# Patient Record
Sex: Female | Born: 1945 | Race: White | Hispanic: No | Smoking: Former smoker
Health system: Southern US, Community
[De-identification: ages and names within clinical notes are randomized; demographics above are authoritative.]

## PROBLEM LIST (undated history)

## (undated) DIAGNOSIS — R112 Nausea with vomiting, unspecified: Secondary | ICD-10-CM

## (undated) DIAGNOSIS — F32A Depression, unspecified: Secondary | ICD-10-CM

## (undated) DIAGNOSIS — Z8042 Family history of malignant neoplasm of prostate: Secondary | ICD-10-CM

## (undated) DIAGNOSIS — F0390 Unspecified dementia without behavioral disturbance: Secondary | ICD-10-CM

## (undated) DIAGNOSIS — E039 Hypothyroidism, unspecified: Secondary | ICD-10-CM

## (undated) DIAGNOSIS — K219 Gastro-esophageal reflux disease without esophagitis: Secondary | ICD-10-CM

## (undated) DIAGNOSIS — M1990 Unspecified osteoarthritis, unspecified site: Secondary | ICD-10-CM

## (undated) DIAGNOSIS — D219 Benign neoplasm of connective and other soft tissue, unspecified: Secondary | ICD-10-CM

## (undated) DIAGNOSIS — F329 Major depressive disorder, single episode, unspecified: Secondary | ICD-10-CM

## (undated) DIAGNOSIS — Z800 Family history of malignant neoplasm of digestive organs: Secondary | ICD-10-CM

## (undated) DIAGNOSIS — F419 Anxiety disorder, unspecified: Secondary | ICD-10-CM

## (undated) DIAGNOSIS — Z9889 Other specified postprocedural states: Secondary | ICD-10-CM

## (undated) DIAGNOSIS — R51 Headache: Secondary | ICD-10-CM

## (undated) DIAGNOSIS — Z8489 Family history of other specified conditions: Secondary | ICD-10-CM

## (undated) DIAGNOSIS — Z803 Family history of malignant neoplasm of breast: Secondary | ICD-10-CM

## (undated) DIAGNOSIS — G4730 Sleep apnea, unspecified: Secondary | ICD-10-CM

## (undated) DIAGNOSIS — R519 Headache, unspecified: Secondary | ICD-10-CM

## (undated) HISTORY — PX: CHOLECYSTECTOMY: SHX55

## (undated) HISTORY — PX: DILATION AND CURETTAGE OF UTERUS: SHX78

## (undated) HISTORY — PX: BREAST SURGERY: SHX581

## (undated) HISTORY — PX: ABDOMINAL HYSTERECTOMY: SHX81

## (undated) HISTORY — PX: SHOULDER ARTHROSCOPY: SHX128

## (undated) HISTORY — DX: Family history of malignant neoplasm of digestive organs: Z80.0

## (undated) HISTORY — DX: Family history of malignant neoplasm of prostate: Z80.42

## (undated) HISTORY — PX: VEIN LIGATION AND STRIPPING: SHX2653

## (undated) HISTORY — DX: Family history of malignant neoplasm of breast: Z80.3

---

## 1988-10-19 HISTORY — PX: AUGMENTATION MAMMAPLASTY: SUR837

## 1988-10-19 HISTORY — PX: REDUCTION MAMMAPLASTY: SUR839

## 2002-02-01 ENCOUNTER — Ambulatory Visit (HOSPITAL_COMMUNITY): Admission: RE | Admit: 2002-02-01 | Discharge: 2002-02-01 | Payer: Self-pay | Admitting: Plastic Surgery

## 2006-12-01 ENCOUNTER — Ambulatory Visit: Payer: Self-pay | Admitting: Gynecologic Oncology

## 2007-06-28 ENCOUNTER — Ambulatory Visit: Payer: Self-pay | Admitting: Gynecologic Oncology

## 2007-07-20 ENCOUNTER — Ambulatory Visit: Payer: Self-pay | Admitting: Gynecologic Oncology

## 2010-02-13 ENCOUNTER — Ambulatory Visit: Payer: Self-pay | Admitting: Gastroenterology

## 2010-02-20 ENCOUNTER — Ambulatory Visit: Payer: Self-pay | Admitting: Gastroenterology

## 2010-02-28 ENCOUNTER — Ambulatory Visit: Payer: Self-pay | Admitting: Surgery

## 2010-03-03 ENCOUNTER — Ambulatory Visit: Payer: Self-pay | Admitting: Surgery

## 2010-09-29 ENCOUNTER — Ambulatory Visit: Payer: Self-pay | Admitting: Family Medicine

## 2010-10-30 ENCOUNTER — Ambulatory Visit: Payer: Self-pay | Admitting: Unknown Physician Specialty

## 2011-04-08 ENCOUNTER — Ambulatory Visit: Payer: Self-pay | Admitting: General Practice

## 2011-04-10 ENCOUNTER — Ambulatory Visit: Payer: Self-pay | Admitting: General Practice

## 2011-06-16 ENCOUNTER — Ambulatory Visit: Payer: Self-pay | Admitting: Family Medicine

## 2011-12-02 ENCOUNTER — Ambulatory Visit: Payer: Self-pay | Admitting: Family Medicine

## 2012-05-19 ENCOUNTER — Ambulatory Visit: Payer: Self-pay | Admitting: Internal Medicine

## 2012-06-24 ENCOUNTER — Ambulatory Visit: Payer: Self-pay | Admitting: Gastroenterology

## 2012-06-27 ENCOUNTER — Other Ambulatory Visit: Payer: Self-pay | Admitting: Gastroenterology

## 2012-06-29 LAB — STOOL CULTURE

## 2012-07-12 ENCOUNTER — Ambulatory Visit: Payer: Self-pay | Admitting: Gastroenterology

## 2012-07-12 LAB — CREATININE, SERUM: EGFR (Non-African Amer.): >60

## 2012-07-13 ENCOUNTER — Ambulatory Visit: Payer: Self-pay | Admitting: Gastroenterology

## 2012-07-27 ENCOUNTER — Telehealth: Payer: Self-pay

## 2012-07-27 DIAGNOSIS — K228 Other specified diseases of esophagus: Secondary | ICD-10-CM

## 2012-07-27 NOTE — Telephone Encounter (Signed)
Pt has been instructed and meds reviewed pt will call with any questions or concerns 

## 2012-08-15 ENCOUNTER — Ambulatory Visit (HOSPITAL_COMMUNITY)
Admission: RE | Admit: 2012-08-15 | Discharge: 2012-08-15 | Disposition: A | Discharge status: Home / Self Care | Payer: Medicare Other | Source: Ambulatory Visit | Attending: Gastroenterology | Admitting: Gastroenterology

## 2012-08-15 ENCOUNTER — Encounter (HOSPITAL_COMMUNITY): Payer: Self-pay

## 2012-08-15 HISTORY — DX: Gastro-esophageal reflux disease without esophagitis: K21.9

## 2012-08-15 NOTE — Anesthesia Preprocedure Evaluation (Addendum)
Anesthesia Evaluation  Patient identified by MRN, date of birth, ID band Patient awake    Reviewed: Allergy & Precautions, H&P , NPO status , Patient's Chart, lab work & pertinent test results  History of Anesthesia Complications (+) PONV  Airway Mallampati: II TM Distance: >3 FB Neck ROM: full    Dental No notable dental hx. (+) Teeth Intact and Dental Advisory Given   Pulmonary neg pulmonary ROS,  breath sounds clear to auscultation  Pulmonary exam normal       Cardiovascular Exercise Tolerance: Good negative cardio ROS  Rhythm:regular Rate:Normal     Neuro/Psych negative neurological ROS  negative psych ROS   GI/Hepatic Neg liver ROS, GERD-  Medicated and Poorly Controlled,  Endo/Other  Hypothyroidism   Renal/GU negative Renal ROS  negative genitourinary   Musculoskeletal   Abdominal   Peds  Hematology negative hematology ROS (+)   Anesthesia Other Findings   Reproductive/Obstetrics negative OB ROS                          Anesthesia Physical Anesthesia Plan  ASA: II  Anesthesia Plan: MAC   Post-op Pain Management:    Induction:   Airway Management Planned:   Additional Equipment:   Intra-op Plan:   Post-operative Plan:   Informed Consent: I have reviewed the patients History and Physical, chart, labs and discussed the procedure including the risks, benefits and alternatives for the proposed anesthesia with the patient or authorized representative who has indicated his/her understanding and acceptance.   Dental Advisory Given  Plan Discussed with: CRNA and Surgeon  Anesthesia Plan Comments:        Anesthesia Quick Evaluation

## 2012-08-18 ENCOUNTER — Encounter (HOSPITAL_COMMUNITY)
Admission: RE | Disposition: A | Discharge status: Home / Self Care | Payer: Self-pay | Source: Ambulatory Visit | Attending: Gastroenterology

## 2012-08-18 ENCOUNTER — Ambulatory Visit (HOSPITAL_COMMUNITY)
Admission: RE | Admit: 2012-08-18 | Discharge: 2012-08-18 | Disposition: A | Discharge status: Home / Self Care | Payer: Medicare Other | Source: Ambulatory Visit | Attending: Gastroenterology | Admitting: Gastroenterology

## 2012-08-18 ENCOUNTER — Encounter (HOSPITAL_COMMUNITY): Payer: Self-pay | Admitting: Anesthesiology

## 2012-08-18 ENCOUNTER — Encounter (HOSPITAL_COMMUNITY): Payer: Self-pay | Admitting: *Deleted

## 2012-08-18 ENCOUNTER — Ambulatory Visit (HOSPITAL_COMMUNITY): Payer: Medicare Other | Admitting: Anesthesiology

## 2012-08-18 DIAGNOSIS — R1013 Epigastric pain: Secondary | ICD-10-CM | POA: Insufficient documentation

## 2012-08-18 DIAGNOSIS — R933 Abnormal findings on diagnostic imaging of other parts of digestive tract: Secondary | ICD-10-CM

## 2012-08-18 DIAGNOSIS — K219 Gastro-esophageal reflux disease without esophagitis: Secondary | ICD-10-CM | POA: Insufficient documentation

## 2012-08-18 HISTORY — PX: EUS: SHX5427

## 2012-08-18 HISTORY — DX: Unspecified dementia, unspecified severity, without behavioral disturbance, psychotic disturbance, mood disturbance, and anxiety: F03.90

## 2012-08-18 HISTORY — DX: Benign neoplasm of connective and other soft tissue, unspecified: D21.9

## 2012-08-18 HISTORY — DX: Major depressive disorder, single episode, unspecified: F32.9

## 2012-08-18 HISTORY — DX: Other specified postprocedural states: Z98.890

## 2012-08-18 HISTORY — DX: Depression, unspecified: F32.A

## 2012-08-18 HISTORY — DX: Other specified postprocedural states: R11.2

## 2012-08-18 SURGERY — UPPER ENDOSCOPIC ULTRASOUND (EUS) LINEAR
Anesthesia: Monitor Anesthesia Care

## 2012-08-18 MED ORDER — MIDAZOLAM HCL 5 MG/5ML IJ SOLN
INTRAMUSCULAR | Status: DC | PRN
  Administered 2012-08-18: 2 mg via INTRAVENOUS

## 2012-08-18 MED ORDER — FENTANYL CITRATE 0.05 MG/ML IJ SOLN
INTRAMUSCULAR | Status: DC | PRN
  Administered 2012-08-18 (×2): 25 ug via INTRAVENOUS

## 2012-08-18 MED ORDER — SODIUM CHLORIDE 0.9 % IV SOLN: INTRAVENOUS | Status: DC

## 2012-08-18 MED ORDER — ONDANSETRON HCL 4 MG/2ML IJ SOLN
INTRAMUSCULAR | Status: DC | PRN
  Administered 2012-08-18: 4 mg via INTRAVENOUS

## 2012-08-18 MED ORDER — FENTANYL CITRATE 0.05 MG/ML IJ SOLN: 25.0000 ug | INTRAMUSCULAR | Status: DC | PRN

## 2012-08-18 MED ORDER — PROPOFOL 10 MG/ML IV EMUL
INTRAVENOUS | Status: DC | PRN
  Administered 2012-08-18: 75 ug/kg/min via INTRAVENOUS

## 2012-08-18 MED ORDER — KETAMINE HCL 50 MG/ML IJ SOLN
INTRAMUSCULAR | Status: DC | PRN
  Administered 2012-08-18: 25 mg via INTRAMUSCULAR

## 2012-08-18 MED ORDER — LACTATED RINGERS IV SOLN
INTRAVENOUS | Status: DC
  Administered 2012-08-18: 1000 mL via INTRAVENOUS

## 2012-08-18 MED ORDER — BUTAMBEN-TETRACAINE-BENZOCAINE 2-2-14 % EX AERO
INHALATION_SPRAY | CUTANEOUS | Status: DC | PRN
  Administered 2012-08-18: 2 via TOPICAL

## 2012-08-18 NOTE — Anesthesia Postprocedure Evaluation (Signed)
  Anesthesia Post-op Note  Patient: Cassie Carlson  Procedure(s) Performed: Procedure(s) (LRB): UPPER ENDOSCOPIC ULTRASOUND (EUS) LINEAR (N/A)  Patient Location: PACU  Anesthesia Type: MAC  Level of Consciousness: awake and alert   Airway and Oxygen Therapy: Patient Spontanous Breathing  Post-op Pain: mild  Post-op Assessment: Post-op Vital signs reviewed, Patient's Cardiovascular Status Stable, Respiratory Function Stable, Patent Airway and No signs of Nausea or vomiting  Post-op Vital Signs: stable  Complications: No apparent anesthesia complications

## 2012-08-18 NOTE — Op Note (Signed)
[facility] [address] [place], [address]   ENDOSCOPIC ULTRASOUND PROCEDURE REPORT  PATIENT: Cassie Carlson, Cassie Carlson  MR#: [id number] BIRTHDATE: Nov 13, 1945  GENDER: Female ENDOSCOPIST: Rachael Fee, MD REFERRED BY:  Barnetta Chapel, MD at [facility] PROCEDURE DATE:  08/18/2012 PROCEDURE:   Upper EUS ASA CLASS:      Class II INDICATIONS:   epigastric pain and abnormal UGI series led to EGD (Dr.  Marva Panda) which found extrinsic compression in upper 1/3 of esophagus; follow up neck/chest CT found no esophagus, neck masses.  MEDICATIONS: MAC sedation, administered by CRNA  DESCRIPTION OF PROCEDURE:   After the risks benefits and alternatives of the procedure were  explained, informed consent was obtained. The patient was then placed in the left, lateral, decubitus postion and IV sedation was administered. Throughout the procedure, the patients blood pressure, pulse and oxygen saturations were monitored continuously.  Under direct visualization, the EUS scope  endoscope was introduced through the mouth  and advanced to the descending duodenum .  Water was used as necessary to provide an acoustic interface.  Upon completion of the imaging, water was removed and the patient was sent to the recovery room in satisfactory condition.  Endoscopic findings: 1. Slight bulging inward of mucosa in upper esophagus (20-25cm from incisors). 2. Otherwise normal UGI examination  EUS findings: 1. The slight bulging described above correspondes with anterior aspect of spine. 2. The echolayering of the wall of the esophagus was normal (no subepithelial lesions). 3. No paraesophageal lesions, masses 4. Limited views of pancreas, liver, spleen were all normal  Impression: The slight bulging inward of upper esophageal mucosa correspondes with the anterior aspect of adjacent spine structures.  There are no esophageal wall lesions, no mediastinal lesions.  I will communicate  these findings with referring MD.  Recommend following her clinically.   _______________________________ eSignedRachael Fee, MD 08/18/2012 8:08 AM

## 2012-08-18 NOTE — Progress Notes (Signed)
Patient's breath sounds are clear in all lung fields,no rails or ronchi.

## 2012-08-18 NOTE — Transfer of Care (Signed)
Immediate Anesthesia Transfer of Care Note  Patient: Cassie Carlson  Procedure(s) Performed: Procedure(s) (LRB): UPPER ENDOSCOPIC ULTRASOUND (EUS) LINEAR (N/A)  Patient Location: PACU  Anesthesia Type: MAC  Level of Consciousness: sedated, patient cooperative and responds to stimulaton  Airway & Oxygen Therapy: Patient Spontanous Breathing and Patient connected to face mask oxgen  Post-op Assessment: Report given to PACU RN and Post -op Vital signs reviewed and stable  Post vital signs: Reviewed and stable  Complications: No apparent anesthesia complications

## 2012-08-18 NOTE — H&P (Signed)
   The recent H&P from [facility] that is available in EPIC under media tab dated 07/25/2012 was reviewed, the patient was examined and there is no change in the patients condition since that H&P was completed.   Rob Bunting  08/18/2012, 7:16 AM

## 2012-08-19 ENCOUNTER — Encounter (HOSPITAL_COMMUNITY): Payer: Self-pay | Admitting: Gastroenterology

## 2012-10-26 ENCOUNTER — Ambulatory Visit: Payer: Self-pay | Admitting: Internal Medicine

## 2013-04-14 ENCOUNTER — Ambulatory Visit: Payer: Self-pay | Admitting: Internal Medicine

## 2013-09-20 ENCOUNTER — Ambulatory Visit: Payer: Self-pay | Admitting: Specialist

## 2014-01-25 ENCOUNTER — Ambulatory Visit: Payer: Self-pay

## 2014-03-13 ENCOUNTER — Ambulatory Visit: Payer: Self-pay | Admitting: Gastroenterology

## 2014-03-15 LAB — PATHOLOGY REPORT

## 2014-06-26 ENCOUNTER — Ambulatory Visit: Payer: Self-pay | Admitting: Specialist

## 2014-07-18 ENCOUNTER — Ambulatory Visit: Payer: Self-pay | Admitting: Unknown Physician Specialty

## 2014-12-11 ENCOUNTER — Other Ambulatory Visit: Payer: Self-pay | Admitting: Neurosurgery

## 2014-12-25 ENCOUNTER — Encounter (HOSPITAL_COMMUNITY): Payer: Self-pay

## 2014-12-25 ENCOUNTER — Encounter (HOSPITAL_COMMUNITY)
Admission: RE | Admit: 2014-12-25 | Discharge: 2014-12-25 | Disposition: A | Discharge status: Home / Self Care | Payer: Medicare Other | Source: Ambulatory Visit | Attending: Neurosurgery | Admitting: Neurosurgery

## 2014-12-25 HISTORY — DX: Unspecified osteoarthritis, unspecified site: M19.90

## 2014-12-25 HISTORY — DX: Hypothyroidism, unspecified: E03.9

## 2014-12-25 HISTORY — DX: Headache, unspecified: R51.9

## 2014-12-25 HISTORY — DX: Headache: R51

## 2014-12-25 LAB — CBC
HCT: 37.7 % (ref 36.0–46.0)
HEMOGLOBIN: 12.6 g/dL (ref 12.0–15.0)
MCH: 32.3 pg (ref 26.0–34.0)
MCHC: 33.4 g/dL (ref 30.0–36.0)
MCV: 96.7 fL (ref 78.0–100.0)
PLATELETS: 206 10*3/uL (ref 150–400)
RBC: 3.9 MIL/uL (ref 3.87–5.11)
RDW: 12.3 % (ref 11.5–15.5)
WBC: 4.4 10*3/uL (ref 4.0–10.5)

## 2014-12-25 LAB — SURGICAL PCR SCREEN
MRSA, PCR: NEGATIVE
Staphylococcus aureus: NEGATIVE

## 2014-12-25 NOTE — Pre-Procedure Instructions (Signed)
JESUSITA JOCELYN  12/25/2014  Your procedure is scheduled on:  01-01-2015   Tuesday   Report to [facility] Admitting at 8:00 AM.   Call this number if you have problems the morning of surgery: [phone]   Remember:   Do not eat food or drink liquids after midnight.    Take these medicines the morning of surgery with A SIP OF WATER:Tylenol if needed,dexilant,levothyroxine,Effexor XR    Do not wear jewelry, make-up or nail polish .  Do not wear lotions, powders, or perfumes. You may not wear deodorant.   Do not shave 48 hours prior to surgery.   Do not bring valuables to the hospital.  [facility] is not responsible for any belongings or valuables.               Contacts, dentures or bridgework may not be worn into surgery .  Leave suitcase in the car. After surgery it may be brought to your room.  For patients admitted to the hospital, discharge time is determined by your   treatment team.               Patients discharged the day of surgery will not be allowed to drive home.      Special Instructions: See attached sheet for instructions on CHG shower/bath   Please read over the following fact sheets that you were given: Pain Booklet and Surgical Site Infection Prevention

## 2014-12-31 MED ORDER — CEFAZOLIN SODIUM-DEXTROSE 2-3 GM-% IV SOLR
2.0000 g | INTRAVENOUS | Status: AC
  Administered 2015-01-01: 2 g via INTRAVENOUS
  Filled 2014-12-31: qty 50

## 2014-12-31 MED ORDER — DEXAMETHASONE SODIUM PHOSPHATE 10 MG/ML IJ SOLN
10.0000 mg | INTRAMUSCULAR | Status: AC
Start: 2015-01-01 — End: 2015-01-01
  Administered 2015-01-01: 10 mg via INTRAVENOUS
  Filled 2014-12-31: qty 1

## 2015-01-01 ENCOUNTER — Inpatient Hospital Stay (HOSPITAL_COMMUNITY)
Admission: RE | Admit: 2015-01-01 | Discharge: 2015-01-02 | DRG: 473 | Disposition: A | Discharge status: Home / Self Care | Payer: Medicare Other | Source: Ambulatory Visit | Attending: Neurosurgery | Admitting: Neurosurgery

## 2015-01-01 ENCOUNTER — Inpatient Hospital Stay (HOSPITAL_COMMUNITY): Payer: Medicare Other

## 2015-01-01 ENCOUNTER — Inpatient Hospital Stay (HOSPITAL_COMMUNITY): Payer: Medicare Other | Admitting: Critical Care Medicine

## 2015-01-01 ENCOUNTER — Encounter (HOSPITAL_COMMUNITY)
Admission: RE | Disposition: A | Discharge status: Home / Self Care | Payer: Self-pay | Source: Ambulatory Visit | Attending: Neurosurgery

## 2015-01-01 ENCOUNTER — Encounter (HOSPITAL_COMMUNITY): Payer: Self-pay | Admitting: Critical Care Medicine

## 2015-01-01 DIAGNOSIS — Z8711 Personal history of peptic ulcer disease: Secondary | ICD-10-CM | POA: Diagnosis not present

## 2015-01-01 DIAGNOSIS — Z87891 Personal history of nicotine dependence: Secondary | ICD-10-CM | POA: Diagnosis not present

## 2015-01-01 DIAGNOSIS — K219 Gastro-esophageal reflux disease without esophagitis: Secondary | ICD-10-CM | POA: Diagnosis present

## 2015-01-01 DIAGNOSIS — E039 Hypothyroidism, unspecified: Secondary | ICD-10-CM | POA: Diagnosis present

## 2015-01-01 DIAGNOSIS — Z419 Encounter for procedure for purposes other than remedying health state, unspecified: Secondary | ICD-10-CM

## 2015-01-01 DIAGNOSIS — M5412 Radiculopathy, cervical region: Secondary | ICD-10-CM | POA: Diagnosis present

## 2015-01-01 DIAGNOSIS — M4722 Other spondylosis with radiculopathy, cervical region: Principal | ICD-10-CM | POA: Diagnosis present

## 2015-01-01 DIAGNOSIS — M542 Cervicalgia: Secondary | ICD-10-CM | POA: Diagnosis present

## 2015-01-01 HISTORY — PX: ANTERIOR CERVICAL DECOMP/DISCECTOMY FUSION: SHX1161

## 2015-01-01 SURGERY — ANTERIOR CERVICAL DECOMPRESSION/DISCECTOMY FUSION 2 LEVELS
Anesthesia: General

## 2015-01-01 MED ORDER — LIDOCAINE HCL (CARDIAC) 20 MG/ML IV SOLN
INTRAVENOUS | Status: DC | PRN
  Administered 2015-01-01: 80 mg via INTRAVENOUS

## 2015-01-01 MED ORDER — ACETAMINOPHEN 325 MG PO TABS
650.0000 mg | ORAL_TABLET | ORAL | Status: DC | PRN
  Administered 2015-01-02 (×2): 650 mg via ORAL
  Filled 2015-01-01 (×2): qty 2

## 2015-01-01 MED ORDER — DEXAMETHASONE SODIUM PHOSPHATE 4 MG/ML IJ SOLN: 4.0000 mg | Freq: Four times a day (QID) | INTRAMUSCULAR | Status: AC

## 2015-01-01 MED ORDER — PANTOPRAZOLE SODIUM 40 MG IV SOLR
40.0000 mg | Freq: Every day | INTRAVENOUS | Status: DC
  Administered 2015-01-01: 40 mg via INTRAVENOUS
  Filled 2015-01-01 (×2): qty 40

## 2015-01-01 MED ORDER — ARTIFICIAL TEARS OP OINT
TOPICAL_OINTMENT | OPHTHALMIC | Status: DC | PRN
  Administered 2015-01-01: 1 via OPHTHALMIC

## 2015-01-01 MED ORDER — PROPOFOL 10 MG/ML IV BOLUS
INTRAVENOUS | Status: AC
Start: 2015-01-01 — End: 2015-01-01
  Filled 2015-01-01: qty 20

## 2015-01-01 MED ORDER — KCL IN DEXTROSE-NACL 20-5-0.45 MEQ/L-%-% IV SOLN
80.0000 mL/h | INTRAVENOUS | Status: DC
  Filled 2015-01-01 (×3): qty 1000

## 2015-01-01 MED ORDER — CYCLOBENZAPRINE HCL 10 MG PO TABS: 10.0000 mg | ORAL_TABLET | Freq: Three times a day (TID) | ORAL | Status: DC | PRN

## 2015-01-01 MED ORDER — PHENYLEPHRINE HCL 10 MG/ML IJ SOLN
10.0000 mg | INTRAMUSCULAR | Status: DC | PRN
  Administered 2015-01-01: 10 ug/min via INTRAVENOUS

## 2015-01-01 MED ORDER — ACETAMINOPHEN 650 MG RE SUPP: 650.0000 mg | RECTAL | Status: DC | PRN

## 2015-01-01 MED ORDER — NEOSTIGMINE METHYLSULFATE 10 MG/10ML IV SOLN
INTRAVENOUS | Status: DC | PRN
  Administered 2015-01-01: 4 mg via INTRAVENOUS

## 2015-01-01 MED ORDER — VECURONIUM BROMIDE 10 MG IV SOLR
INTRAVENOUS | Status: AC
  Filled 2015-01-01: qty 10

## 2015-01-01 MED ORDER — SODIUM CHLORIDE 0.9 % IJ SOLN: 3.0000 mL | INTRAMUSCULAR | Status: DC | PRN

## 2015-01-01 MED ORDER — PROPOFOL 10 MG/ML IV BOLUS
INTRAVENOUS | Status: DC | PRN
  Administered 2015-01-01: 20 mg via INTRAVENOUS
  Administered 2015-01-01: 180 mg via INTRAVENOUS

## 2015-01-01 MED ORDER — DEXAMETHASONE 4 MG PO TABS
4.0000 mg | ORAL_TABLET | Freq: Four times a day (QID) | ORAL | Status: AC
  Administered 2015-01-01 (×2): 4 mg via ORAL
  Filled 2015-01-01 (×2): qty 1

## 2015-01-01 MED ORDER — 0.9 % SODIUM CHLORIDE (POUR BTL) OPTIME
TOPICAL | Status: DC | PRN
  Administered 2015-01-01: 1000 mL

## 2015-01-01 MED ORDER — ONDANSETRON HCL 4 MG/2ML IJ SOLN
INTRAMUSCULAR | Status: AC
  Filled 2015-01-01: qty 4

## 2015-01-01 MED ORDER — THROMBIN 5000 UNITS EX SOLR
CUTANEOUS | Status: DC | PRN
  Administered 2015-01-01 (×2): 5000 [IU] via TOPICAL

## 2015-01-01 MED ORDER — NEOSTIGMINE METHYLSULFATE 10 MG/10ML IV SOLN
INTRAVENOUS | Status: AC
  Filled 2015-01-01: qty 1

## 2015-01-01 MED ORDER — PHENYLEPHRINE HCL 10 MG/ML IJ SOLN
INTRAMUSCULAR | Status: AC
  Filled 2015-01-01: qty 1

## 2015-01-01 MED ORDER — PHENOL 1.4 % MT LIQD
1.0000 | OROMUCOSAL | Status: DC | PRN
  Administered 2015-01-01: 1 via OROMUCOSAL
  Filled 2015-01-01: qty 177

## 2015-01-01 MED ORDER — GABAPENTIN 300 MG PO CAPS
300.0000 mg | ORAL_CAPSULE | Freq: Every day | ORAL | Status: DC
  Administered 2015-01-01: 300 mg via ORAL
  Filled 2015-01-01 (×2): qty 1

## 2015-01-01 MED ORDER — SODIUM CHLORIDE 0.9 % IR SOLN
Status: DC | PRN
  Administered 2015-01-01: 10:00:00

## 2015-01-01 MED ORDER — MENTHOL 3 MG MT LOZG: 1.0000 | LOZENGE | OROMUCOSAL | Status: DC | PRN

## 2015-01-01 MED ORDER — FENTANYL CITRATE 0.05 MG/ML IJ SOLN
INTRAMUSCULAR | Status: AC
  Filled 2015-01-01: qty 2

## 2015-01-01 MED ORDER — LACTATED RINGERS IV SOLN
INTRAVENOUS | Status: DC
  Administered 2015-01-01 (×2): via INTRAVENOUS

## 2015-01-01 MED ORDER — DOCUSATE SODIUM 100 MG PO CAPS
100.0000 mg | ORAL_CAPSULE | Freq: Two times a day (BID) | ORAL | Status: DC
  Administered 2015-01-01: 100 mg via ORAL
  Filled 2015-01-01: qty 1

## 2015-01-01 MED ORDER — SODIUM CHLORIDE 0.9 % IJ SOLN
3.0000 mL | Freq: Two times a day (BID) | INTRAMUSCULAR | Status: DC
  Administered 2015-01-01 (×2): 3 mL via INTRAVENOUS

## 2015-01-01 MED ORDER — VENLAFAXINE HCL ER 150 MG PO CP24
150.0000 mg | ORAL_CAPSULE | Freq: Every day | ORAL | Status: DC
  Administered 2015-01-02: 150 mg via ORAL
  Filled 2015-01-01: qty 1

## 2015-01-01 MED ORDER — ROCURONIUM BROMIDE 50 MG/5ML IV SOLN
INTRAVENOUS | Status: AC
  Filled 2015-01-01: qty 1

## 2015-01-01 MED ORDER — VECURONIUM BROMIDE 10 MG IV SOLR
INTRAVENOUS | Status: DC | PRN
  Administered 2015-01-01 (×2): 1 mg via INTRAVENOUS

## 2015-01-01 MED ORDER — PROMETHAZINE HCL 25 MG/ML IJ SOLN: 6.2500 mg | INTRAMUSCULAR | Status: DC | PRN

## 2015-01-01 MED ORDER — SUCRALFATE 1 GM/10ML PO SUSP
1.0000 g | Freq: Four times a day (QID) | ORAL | Status: DC | PRN
  Filled 2015-01-01: qty 10

## 2015-01-01 MED ORDER — PHENYLEPHRINE HCL 10 MG/ML IJ SOLN
INTRAMUSCULAR | Status: DC | PRN
  Administered 2015-01-01: 120 ug via INTRAVENOUS
  Administered 2015-01-01: 40 ug via INTRAVENOUS
  Administered 2015-01-01: 80 ug via INTRAVENOUS

## 2015-01-01 MED ORDER — KCL IN DEXTROSE-NACL 20-5-0.45 MEQ/L-%-% IV SOLN
INTRAVENOUS | Status: AC
  Filled 2015-01-01: qty 1000

## 2015-01-01 MED ORDER — ROCURONIUM BROMIDE 100 MG/10ML IV SOLN
INTRAVENOUS | Status: DC | PRN
  Administered 2015-01-01: 10 mg via INTRAVENOUS
  Administered 2015-01-01: 40 mg via INTRAVENOUS

## 2015-01-01 MED ORDER — FENTANYL CITRATE 0.05 MG/ML IJ SOLN
25.0000 ug | INTRAMUSCULAR | Status: DC | PRN
  Administered 2015-01-01: 25 ug via INTRAVENOUS

## 2015-01-01 MED ORDER — MIDAZOLAM HCL 2 MG/2ML IJ SOLN
INTRAMUSCULAR | Status: AC
  Filled 2015-01-01: qty 2

## 2015-01-01 MED ORDER — PHENYLEPHRINE 40 MCG/ML (10ML) SYRINGE FOR IV PUSH (FOR BLOOD PRESSURE SUPPORT)
PREFILLED_SYRINGE | INTRAVENOUS | Status: AC
  Filled 2015-01-01: qty 10

## 2015-01-01 MED ORDER — LIDOCAINE HCL (CARDIAC) 20 MG/ML IV SOLN
INTRAVENOUS | Status: AC
  Filled 2015-01-01: qty 10

## 2015-01-01 MED ORDER — GLYCOPYRROLATE 0.2 MG/ML IJ SOLN
INTRAMUSCULAR | Status: AC
  Filled 2015-01-01: qty 3

## 2015-01-01 MED ORDER — HYDROCODONE-ACETAMINOPHEN 5-325 MG PO TABS
1.0000 | ORAL_TABLET | ORAL | Status: DC | PRN
  Administered 2015-01-01: 2 via ORAL
  Filled 2015-01-01: qty 2

## 2015-01-01 MED ORDER — HYDROMORPHONE HCL 1 MG/ML IJ SOLN: 1.0000 mg | INTRAMUSCULAR | Status: DC | PRN

## 2015-01-01 MED ORDER — ONDANSETRON HCL 4 MG/2ML IJ SOLN: 4.0000 mg | INTRAMUSCULAR | Status: DC | PRN

## 2015-01-01 MED ORDER — EPHEDRINE SULFATE 50 MG/ML IJ SOLN
INTRAMUSCULAR | Status: DC | PRN
  Administered 2015-01-01: 5 mg via INTRAVENOUS

## 2015-01-01 MED ORDER — GLYCOPYRROLATE 0.2 MG/ML IJ SOLN
INTRAMUSCULAR | Status: DC | PRN
  Administered 2015-01-01 (×2): 0.1 mg via INTRAVENOUS
  Administered 2015-01-01: 0.6 mg via INTRAVENOUS

## 2015-01-01 MED ORDER — CEFAZOLIN SODIUM-DEXTROSE 2-3 GM-% IV SOLR
2.0000 g | Freq: Three times a day (TID) | INTRAVENOUS | Status: AC
  Administered 2015-01-01 – 2015-01-02 (×2): 2 g via INTRAVENOUS
  Filled 2015-01-01 (×3): qty 50

## 2015-01-01 MED ORDER — HEMOSTATIC AGENTS (NO CHARGE) OPTIME
TOPICAL | Status: DC | PRN
  Administered 2015-01-01: 1 via TOPICAL

## 2015-01-01 MED ORDER — LEVOTHYROXINE SODIUM 25 MCG PO TABS
25.0000 ug | ORAL_TABLET | Freq: Every day | ORAL | Status: DC
  Administered 2015-01-02: 25 ug via ORAL
  Filled 2015-01-01 (×2): qty 1

## 2015-01-01 MED ORDER — SUCCINYLCHOLINE CHLORIDE 20 MG/ML IJ SOLN
INTRAMUSCULAR | Status: AC
  Filled 2015-01-01: qty 1

## 2015-01-01 MED ORDER — FENTANYL CITRATE 0.05 MG/ML IJ SOLN
INTRAMUSCULAR | Status: AC
  Filled 2015-01-01: qty 5

## 2015-01-01 MED ORDER — ONDANSETRON HCL 4 MG/2ML IJ SOLN
INTRAMUSCULAR | Status: DC | PRN
  Administered 2015-01-01 (×2): 4 mg via INTRAVENOUS

## 2015-01-01 MED ORDER — FENTANYL CITRATE 0.05 MG/ML IJ SOLN
INTRAMUSCULAR | Status: DC | PRN
  Administered 2015-01-01 (×5): 50 ug via INTRAVENOUS

## 2015-01-01 SURGICAL SUPPLY — 60 items
APL SKNCLS STERI-STRIP NONHPOA (GAUZE/BANDAGES/DRESSINGS) ×1
BAG DECANTER FOR FLEXI CONT (MISCELLANEOUS) ×3 IMPLANT
BENZOIN TINCTURE PRP APPL 2/3 (GAUZE/BANDAGES/DRESSINGS) ×5 IMPLANT
BIT DRILL TRINICA 2.3MM (BIT) IMPLANT
BRUSH SCRUB EZ PLAIN DRY (MISCELLANEOUS) ×3 IMPLANT
BUR MATCHSTICK NEURO 3.0 LAGG (BURR) ×3 IMPLANT
CANISTER SUCT 3000ML PPV (MISCELLANEOUS) ×3 IMPLANT
CLOSURE WOUND 1/2 X4 (GAUZE/BANDAGES/DRESSINGS) ×1
CONT SPEC 4OZ CLIKSEAL STRL BL (MISCELLANEOUS) ×3 IMPLANT
DRAPE C-ARM 42X72 X-RAY (DRAPES) ×6 IMPLANT
DRAPE LAPAROTOMY 100X72 PEDS (DRAPES) ×3 IMPLANT
DRAPE MICROSCOPE LEICA (MISCELLANEOUS) ×3 IMPLANT
DRAPE SURG 17X23 STRL (DRAPES) ×6 IMPLANT
DRILL BIT TRINICA 2.3MM (BIT) ×3
DRSG OPSITE POSTOP 4X6 (GAUZE/BANDAGES/DRESSINGS) ×2 IMPLANT
DRSG TELFA 3X8 NADH (GAUZE/BANDAGES/DRESSINGS) ×3 IMPLANT
DURAPREP 6ML APPLICATOR 50/CS (WOUND CARE) ×3 IMPLANT
ELECT COATED BLADE 2.86 ST (ELECTRODE) ×3 IMPLANT
ELECT REM PT RETURN 9FT ADLT (ELECTROSURGICAL) ×3
ELECTRODE REM PT RTRN 9FT ADLT (ELECTROSURGICAL) ×1 IMPLANT
GAUZE SPONGE 4X4 12PLY STRL (GAUZE/BANDAGES/DRESSINGS) ×3 IMPLANT
GAUZE SPONGE 4X4 16PLY XRAY LF (GAUZE/BANDAGES/DRESSINGS) IMPLANT
GLOVE BIO SURGEON STRL SZ7 (GLOVE) ×3 IMPLANT
GLOVE ECLIPSE 6.5 STRL STRAW (GLOVE) ×3 IMPLANT
GLOVE ECLIPSE 7.5 STRL STRAW (GLOVE) ×6 IMPLANT
GLOVE ECLIPSE 8.0 STRL XLNG CF (GLOVE) ×3 IMPLANT
GLOVE EXAM NITRILE LRG STRL (GLOVE) IMPLANT
GLOVE EXAM NITRILE MD LF STRL (GLOVE) ×3 IMPLANT
GLOVE EXAM NITRILE XL STR (GLOVE) IMPLANT
GLOVE EXAM NITRILE XS STR PU (GLOVE) IMPLANT
GLOVE INDICATOR 7.5 STRL GRN (GLOVE) ×6 IMPLANT
GOWN STRL REUS W/ TWL LRG LVL3 (GOWN DISPOSABLE) IMPLANT
GOWN STRL REUS W/ TWL XL LVL3 (GOWN DISPOSABLE) IMPLANT
GOWN STRL REUS W/TWL 2XL LVL3 (GOWN DISPOSABLE) ×3 IMPLANT
GOWN STRL REUS W/TWL LRG LVL3 (GOWN DISPOSABLE) ×6
GOWN STRL REUS W/TWL XL LVL3 (GOWN DISPOSABLE) ×3
HALTER HD/CHIN CERV TRACTION D (MISCELLANEOUS) ×3 IMPLANT
INTERBODY TM 11X14X5-7DEG ANG (Metal Cage) ×4 IMPLANT
KIT BASIN OR (CUSTOM PROCEDURE TRAY) ×3 IMPLANT
KIT ROOM TURNOVER OR (KITS) ×3 IMPLANT
NEEDLE SPNL 20GX3.5 QUINCKE YW (NEEDLE) ×3 IMPLANT
NS IRRIG 1000ML POUR BTL (IV SOLUTION) ×3 IMPLANT
PACK LAMINECTOMY NEURO (CUSTOM PROCEDURE TRAY) ×3 IMPLANT
PAD ARMBOARD 7.5X6 YLW CONV (MISCELLANEOUS) ×3 IMPLANT
PAD DRESSING TELFA 3X8 NADH (GAUZE/BANDAGES/DRESSINGS) ×1 IMPLANT
PATTIES SURGICAL .75X.75 (GAUZE/BANDAGES/DRESSINGS) ×3 IMPLANT
PLATE 38MM (Plate) ×2 IMPLANT
PUTTY BONE GRAFT KIT 2.5ML (Bone Implant) ×2 IMPLANT
RUBBERBAND STERILE (MISCELLANEOUS) ×6 IMPLANT
SCREW SD FIXED 12MM (Screw) ×18 IMPLANT
SPONGE INTESTINAL PEANUT (DISPOSABLE) ×3 IMPLANT
SPONGE SURGIFOAM ABS GEL SZ50 (HEMOSTASIS) ×3 IMPLANT
STRIP CLOSURE SKIN 1/2X4 (GAUZE/BANDAGES/DRESSINGS) ×2 IMPLANT
SUT PDS AB 5-0 P3 18 (SUTURE) ×3 IMPLANT
SUT VIC AB 3-0 CP2 18 (SUTURE) ×3 IMPLANT
SYR 20ML ECCENTRIC (SYRINGE) ×3 IMPLANT
TOWEL OR 17X24 6PK STRL BLUE (TOWEL DISPOSABLE) ×3 IMPLANT
TOWEL OR 17X26 10 PK STRL BLUE (TOWEL DISPOSABLE) ×3 IMPLANT
TRAP SPECIMEN MUCOUS 40CC (MISCELLANEOUS) ×2 IMPLANT
WATER STERILE IRR 1000ML POUR (IV SOLUTION) ×3 IMPLANT

## 2015-01-01 NOTE — Anesthesia Procedure Notes (Signed)
Procedure Name: Intubation Date/Time: 01/01/2015 10:14 AM Performed by: Merrilyn Puma B Pre-anesthesia Checklist: Patient identified, Timeout performed, Emergency Drugs available, Suction available and Patient being monitored Patient Re-evaluated:Patient Re-evaluated prior to inductionOxygen Delivery Method: Circle system utilized Preoxygenation: Pre-oxygenation with 100% oxygen Intubation Type: IV induction Ventilation: Mask ventilation without difficulty and Oral airway inserted - appropriate to patient size Laryngoscope Size: Mac and 3 Grade View: Grade II Tube type: Oral Tube size: 7.0 mm Number of attempts: 1 Airway Equipment and Method: Stylet and LTA kit utilized Placement Confirmation: ETT inserted through vocal cords under direct vision,  positive ETCO2,  CO2 detector and breath sounds checked- equal and bilateral Secured at: 21 cm Tube secured with: Tape Dental Injury: Teeth and Oropharynx as per pre-operative assessment

## 2015-01-01 NOTE — Progress Notes (Addendum)
[facility]in services called to notarize pt living will, states to call social work, social work states to call chaplin. Chaplin services called, states that the notary is in a meeting and can not come now, they said to call the list on the home page. Pt states that she is ok to get this singed after surgery, due to Dr Hal Neer is ready to start.

## 2015-01-01 NOTE — Transfer of Care (Signed)
Immediate Anesthesia Transfer of Care Note  Patient: Cassie Carlson  Procedure(s) Performed: Procedure(s): ANTERIOR CERVICAL DECOMPRESSION/DISCECTOMY FUSION CERVICAL FIVE-SIX,CERVICAL SIX-SEVEN (N/A)  Patient Location: PACU  Anesthesia Type:General  Level of Consciousness: awake and alert   Airway & Oxygen Therapy: Patient Spontanous Breathing and Patient connected to nasal cannula oxygen  Post-op Assessment: Report given to RN, Post -op Vital signs reviewed and stable and Patient moving all extremities X 4  Post vital signs: Reviewed and stable  Last Vitals:  Filed Vitals:   01/01/15 0848  BP: 123/67  Pulse: 68  Temp: 36.3 C  Resp: 20    Complications: No apparent anesthesia complications

## 2015-01-01 NOTE — Plan of Care (Signed)
Problem: Consults Goal: Diagnosis - Spinal Surgery Outcome: Completed/Met Date Met:  01/01/15 Cervical Spine Fusion

## 2015-01-01 NOTE — Progress Notes (Signed)
Utilization review completed.  

## 2015-01-01 NOTE — Anesthesia Preprocedure Evaluation (Addendum)
Anesthesia Evaluation  Patient identified by MRN, date of birth, ID band Patient awake    Reviewed: Allergy & Precautions, NPO status , Patient's Chart, lab work & pertinent test results  History of Anesthesia Complications (+) PONV  Airway Mallampati: II  TM Distance: >3 FB Neck ROM: Full    Dental  (+) Dental Advisory Given   Pulmonary neg pulmonary ROS, former smoker,  breath sounds clear to auscultation        Cardiovascular negative cardio ROS  Rhythm:Regular Rate:Normal     Neuro/Psych  Headaches, PSYCHIATRIC DISORDERS    GI/Hepatic Neg liver ROS, GERD-  ,  Endo/Other  Hypothyroidism   Renal/GU negative Renal ROS     Musculoskeletal  (+) Arthritis -,   Abdominal   Peds  Hematology   Anesthesia Other Findings   Reproductive/Obstetrics                            Anesthesia Physical Anesthesia Plan  ASA: III  Anesthesia Plan: General   Post-op Pain Management:    Induction: Intravenous  Airway Management Planned: Oral ETT  Additional Equipment:   Intra-op Plan:   Post-operative Plan: Extubation in OR  Informed Consent: I have reviewed the patients History and Physical, chart, labs and discussed the procedure including the risks, benefits and alternatives for the proposed anesthesia with the patient or authorized representative who has indicated his/her understanding and acceptance.   Dental advisory given  Plan Discussed with: CRNA and Anesthesiologist  Anesthesia Plan Comments:         Anesthesia Quick Evaluation

## 2015-01-01 NOTE — Anesthesia Postprocedure Evaluation (Signed)
  Anesthesia Post-op Note  Patient: Cassie Carlson  Procedure(s) Performed: Procedure(s): ANTERIOR CERVICAL DECOMPRESSION/DISCECTOMY FUSION CERVICAL FIVE-SIX,CERVICAL SIX-SEVEN (N/A)  Patient Location: PACU  Anesthesia Type:General  Level of Consciousness: awake  Airway and Oxygen Therapy: Patient Spontanous Breathing  Post-op Pain: mild  Post-op Assessment: Post-op Vital signs reviewed  Post-op Vital Signs: Reviewed  Last Vitals:  Filed Vitals:   01/01/15 1638  BP: 131/53  Pulse: 77  Temp: 36.6 C  Resp: 18    Complications: No apparent anesthesia complications

## 2015-01-01 NOTE — Op Note (Signed)
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 Localizing x-rays taken prior to incision to identify the appropriate level. Transverse incision was made in the right anterior neck started the midline and headed towards medial aspect of the sternal cremaster muscle. The platysma muscle was then incised transversely and the natural fascial plane between the strap muscles medially and the sternal cremaster laterally was identified and followed down to the anterior aspect the cervical spine. The longus Cole muscles were identified split in the midline and stripped away bilaterally with unipolar regulation and Kitner dissection. Self retaining retractor was placed for exposure and x-ray showed approach the appropriate levels. Using a 15 blade Dennis a disc at C5-6 and C6-7 was incised. Using pituitary rongeurs and curettes approximately 90% of disc material was removed. High-speed drill was used to widen the interspace and bony shavings were saved for use later in the case. At this time the microscope was draped brought in the field and used for the remainder the case. Starting at C6-7 the remainder of the disc material down the posterior longitudinal ligament was incised removed. We was then incised transversely and the cut edges removed a Kerrison punch. Thorough decompression was carried out on the spinal dura into the foramen bilaterally particularly on the left comes into side with the C7 nerve root was well decompressed well visualized. Summary compression was then carried out at C5-6 with marked removal of all the material compressing the thecal sac and nerve roots bilaterally. At  this time inspection was carried out in all directions both levels for any evidence of residual compression and none could be identified. Irrigation was carried out and any bleeding control proper coagulation Gelfoam. Measurements were taken and to 5 mm trabecular metal lordotic graft were chosen and filled with a mixture to use bone and morselized allograft. After confirming hemostasis once more the plugs were impacted without difficulty and fossae showed an to be in good position. An appropriately length Trinica anterior cervical plate was then chosen. Under fluoroscopic guidance drill holes were placed followed by placing of 12 mm screws 6. Locking mechanism was rotated locked position final ferocity showed good position of the plates screws and plugs. Irrigation was carried out and any bleeding control proper coagulation and Gelfoam. The was then closed with inverted Vicryl on the platysma muscle and subcuticular layer. Steri-Strips were placed on the skin. A sterile dressing was then applied and the patient was extubated and taken to recovery room in stable condition.

## 2015-01-01 NOTE — H&P (Signed)
Cassie Carlson is an 69 y.o. female.   Chief Complaint: Neck and left arm pain HPI: The patient is a 69 year old female who is evaluated in the office for neck pain with rotation the left arm of years duration. There is no inciting event. She saw an orthopedist the pain management specialist has tried massage exercise Neurontin Tylenol pain medicine without relief. She has a history of peptic ulcer disease still cannot take anti-inflammatory medications. She had injections in the past which did not give her any relief. An MRI scan was done she now comes for evaluation. She felt the problems extremely debilitated she has a flight because of the difficulty with her neck and left arm. 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 after evaluation the office the films were reviewed which showed spondylosis with neural foraminal encroachment at c5-6 and c6-7. after discussing the options the patient requested surgery and now comes for a two-level anterior cervical discectomy with fusion and plating. i had a long discussion with her regarding the risks and benefits of surgical intervention. the risks discussed include but are not limited to bleeding infection weakness some as paralysis spinal fluid leak coma quadriplegia hoarseness and death. we have discussed alternative methods of therapy although the risks and benefits of nonintervention. she's had the opportunity to ask numerous questions and appears to understand. with this information in hand she has requested to proceed with surgery.  Past Medical History  Diagnosis Date  . GERD (gastroesophageal reflux disease)   . Fibroids     excessive vaginal bleeding  . Dementia   . PONV (postoperative nausea and vomiting)   . Hypothyroidism   . Headache   . Arthritis     Past Surgical History  Procedure Laterality Date  . Abdominal hysterectomy    . Sholder    . Vein ligation and stripping    . Breast surgery    . Cholecystectomy    . Eus  08/18/2012    Procedure:  UPPER ENDOSCOPIC ULTRASOUND (EUS) LINEAR;  Surgeon: Milus Banister, MD;  Location: [facility];  Service: Endoscopy;  Laterality: N/A;  . Dilation and curettage of uterus      History reviewed. No pertinent family history. Social History:  reports that she quit smoking about 49 years ago. She does not have any smokeless tobacco history on file. She reports that she does not drink alcohol or use illicit drugs.  Allergies:  Allergies  Allergen Reactions  . Asa [Aspirin] Nausea And Vomiting  . Codeine   . Ibuprofen Nausea And Vomiting    Medications Prior to Admission  Medication Sig Dispense Refill  . acetaminophen (TYLENOL) 500 MG tablet Take 500-1,000 mg by mouth every 6 (six) hours as needed for mild pain or moderate pain (pt can take up to 2 tablets for pain).    Marland Kitchen dexlansoprazole (DEXILANT) 60 MG capsule Take 60 mg by mouth daily.    Marland Kitchen gabapentin (NEURONTIN) 300 MG capsule Take 300 mg by mouth at bedtime.    Marland Kitchen levothyroxine (SYNTHROID, LEVOTHROID) 25 MCG tablet Take 25 mcg by mouth daily before breakfast.    . sucralfate (CARAFATE) 1 GM/10ML suspension Take 1 g by mouth 4 (four) times daily as needed (for acid reflux).     . venlafaxine XR (EFFEXOR-XR) 150 MG 24 hr capsule Take 150 mg by mouth daily.      No results found for this or any previous visit (from the past 48 hour(s)). No results found.  Positive for nasal congestion sore  throat shortness of breath  Blood pressure 123/67, pulse 68, temperature 97.4 F (36.3 C), temperature source Oral, resp. rate 20, height 5\' 4"  (1.626 m), weight 88.905 kg (196 lb), SpO2 96 %.  The patient is awake alert and oriented. She has no facial asymmetry. She has 1+ reflexes. She has some slight decreased strength of the left bicep muscle. Her sensation is intact. Assessment/Plan Impression is that of spondylosis at C5-6 and C6-7. The plan is for a two-level anterior cervical discectomy with fusion and plating  Faythe Ghee,  MD 01/01/2015, 9:37 AM

## 2015-01-02 ENCOUNTER — Encounter (HOSPITAL_COMMUNITY): Payer: Self-pay | Admitting: Neurosurgery

## 2015-01-02 MED ORDER — HYDROCODONE-ACETAMINOPHEN 5-325 MG PO TABS: 1.0000 | ORAL_TABLET | ORAL | Status: DC | PRN

## 2015-01-02 NOTE — Discharge Summary (Signed)
  Physician Discharge Summary  Patient ID: Cassie Carlson MRN: [id number] DOB/AGE: 71-Feb-1947 79 y.o.  Admit date: 01/01/2015 Discharge date: 01/02/2015  Admission Diagnoses:  Discharge Diagnoses:  Active Problems:   Cervical radiculopathy   Discharged Condition: good  Hospital Course: Surgery yesterday w 2 level acdf. Did well with marked improvement in her arm pain.Ambulated well. No isues over night. Home pod 1, specific instructions given.  Consults: None  Significant Diagnostic Studies: none  Treatments: surgery: C 56 c 67 acdf  Discharge Exam: Blood pressure 102/43, pulse 68, temperature 98.2 F (36.8 C), temperature source Oral, resp. rate 18, height 5\' 4"  (1.626 m), weight 88.905 kg (196 lb), SpO2 99 %. Incision/Wound:clean and dry; no new neuro issues  Disposition: 01-Home or Self Care     Medication List    ASK your doctor about these medications        acetaminophen 500 MG tablet  Commonly known as:  TYLENOL  Take 500-1,000 mg by mouth every 6 (six) hours as needed for mild pain or moderate pain (pt can take up to 2 tablets for pain).     dexlansoprazole 60 MG capsule  Commonly known as:  DEXILANT  Take 60 mg by mouth daily.     gabapentin 300 MG capsule  Commonly known as:  NEURONTIN  Take 300 mg by mouth at bedtime.     levothyroxine 25 MCG tablet  Commonly known as:  SYNTHROID, LEVOTHROID  Take 25 mcg by mouth daily before breakfast.     sucralfate 1 GM/10ML suspension  Commonly known as:  CARAFATE  Take 1 g by mouth 4 (four) times daily as needed (for acid reflux).     venlafaxine XR 150 MG 24 hr capsule  Commonly known as:  EFFEXOR-XR  Take 150 mg by mouth daily.         At home rest most of the time. Get up 9 or 10 times each day and take a 15 or 20 minute walk. No riding in the car and to your first postoperative appointment. If you have neck surgery you may shower from the chest down starting on the third postoperative day. If you had  back surgery he may start showering on the third postoperative day with saran wrap wrapped around your incisional area 3 times. After the shower remove the saran wrap. Take pain medicine as needed and other medications as instructed. Call my office for an appointment.  SignedFaythe Ghee, MD 01/02/2015, 8:37 AM

## 2015-01-02 NOTE — Progress Notes (Signed)
Patient alert and oriented, mae's well, voiding adequate amount of urine, swallowing without difficulty, no c/o pain. Patient discharged home with family. Script and discharged instructions given to patient. Patient and family stated understanding of d/c instructions given and has an appointment with MD. Aisha Sarrah Fiorenza RN 

## 2015-02-15 ENCOUNTER — Other Ambulatory Visit: Payer: Self-pay | Admitting: Obstetrics and Gynecology

## 2016-01-06 ENCOUNTER — Other Ambulatory Visit: Payer: Self-pay | Admitting: Unknown Physician Specialty

## 2016-01-06 DIAGNOSIS — M25511 Pain in right shoulder: Secondary | ICD-10-CM

## 2016-01-06 DIAGNOSIS — M7501 Adhesive capsulitis of right shoulder: Secondary | ICD-10-CM

## 2016-01-29 ENCOUNTER — Other Ambulatory Visit: Payer: PRIVATE HEALTH INSURANCE

## 2016-01-30 ENCOUNTER — Ambulatory Visit
Admission: RE | Admit: 2016-01-30 | Discharge: 2016-01-30 | Disposition: A | Discharge status: Home / Self Care | Payer: Medicare Other | Source: Ambulatory Visit | Attending: Unknown Physician Specialty | Admitting: Unknown Physician Specialty

## 2016-01-30 DIAGNOSIS — M7501 Adhesive capsulitis of right shoulder: Secondary | ICD-10-CM

## 2016-01-30 DIAGNOSIS — M19011 Primary osteoarthritis, right shoulder: Secondary | ICD-10-CM | POA: Insufficient documentation

## 2016-01-30 DIAGNOSIS — M25511 Pain in right shoulder: Secondary | ICD-10-CM

## 2016-01-30 DIAGNOSIS — M85621 Other cyst of bone, right upper arm: Secondary | ICD-10-CM | POA: Insufficient documentation

## 2016-01-30 MED ORDER — GADOBENATE DIMEGLUMINE 529 MG/ML IV SOLN: 0.0100 mL | Freq: Once | INTRAVENOUS | Status: DC | PRN

## 2016-01-30 MED ORDER — IOHEXOL 180 MG/ML  SOLN: 8.0000 mL | Freq: Once | INTRAMUSCULAR | Status: DC | PRN

## 2016-12-07 ENCOUNTER — Other Ambulatory Visit: Payer: Self-pay | Admitting: Gastroenterology

## 2016-12-11 ENCOUNTER — Other Ambulatory Visit: Payer: Self-pay | Admitting: Gastroenterology

## 2016-12-11 DIAGNOSIS — R1013 Epigastric pain: Secondary | ICD-10-CM

## 2016-12-11 DIAGNOSIS — K219 Gastro-esophageal reflux disease without esophagitis: Secondary | ICD-10-CM

## 2016-12-11 DIAGNOSIS — R6881 Early satiety: Secondary | ICD-10-CM

## 2017-01-14 ENCOUNTER — Other Ambulatory Visit: Payer: Self-pay | Admitting: Internal Medicine

## 2017-01-14 DIAGNOSIS — R911 Solitary pulmonary nodule: Secondary | ICD-10-CM

## 2017-01-20 ENCOUNTER — Ambulatory Visit
Admission: RE | Admit: 2017-01-20 | Discharge: 2017-01-20 | Disposition: A | Discharge status: Home / Self Care | Payer: Medicare Other | Source: Ambulatory Visit | Attending: Internal Medicine | Admitting: Internal Medicine

## 2017-01-20 DIAGNOSIS — I700 Atherosclerosis of aorta: Secondary | ICD-10-CM | POA: Insufficient documentation

## 2017-01-20 DIAGNOSIS — K449 Diaphragmatic hernia without obstruction or gangrene: Secondary | ICD-10-CM | POA: Insufficient documentation

## 2017-01-20 DIAGNOSIS — Z9882 Breast implant status: Secondary | ICD-10-CM | POA: Insufficient documentation

## 2017-01-20 DIAGNOSIS — I2510 Atherosclerotic heart disease of native coronary artery without angina pectoris: Secondary | ICD-10-CM | POA: Diagnosis not present

## 2017-01-20 DIAGNOSIS — R911 Solitary pulmonary nodule: Secondary | ICD-10-CM | POA: Diagnosis present

## 2017-01-20 LAB — POCT I-STAT CREATININE: CREATININE: 0.9 mg/dL (ref 0.44–1.00)

## 2017-01-20 MED ORDER — IOPAMIDOL (ISOVUE-300) INJECTION 61%
75.0000 mL | Freq: Once | INTRAVENOUS | Status: AC | PRN
  Administered 2017-01-20: 75 mL via INTRAVENOUS

## 2017-06-01 ENCOUNTER — Other Ambulatory Visit: Payer: Self-pay | Admitting: Unknown Physician Specialty

## 2017-06-01 DIAGNOSIS — M5030 Other cervical disc degeneration, unspecified cervical region: Secondary | ICD-10-CM

## 2017-06-01 DIAGNOSIS — M542 Cervicalgia: Secondary | ICD-10-CM

## 2017-06-15 ENCOUNTER — Ambulatory Visit
Admission: RE | Admit: 2017-06-15 | Discharge: 2017-06-15 | Disposition: A | Discharge status: Home / Self Care | Payer: Medicare Other | Source: Ambulatory Visit | Attending: Unknown Physician Specialty | Admitting: Unknown Physician Specialty

## 2017-06-15 DIAGNOSIS — M542 Cervicalgia: Secondary | ICD-10-CM | POA: Diagnosis present

## 2017-06-15 DIAGNOSIS — M5030 Other cervical disc degeneration, unspecified cervical region: Secondary | ICD-10-CM | POA: Diagnosis present

## 2017-06-15 DIAGNOSIS — Z981 Arthrodesis status: Secondary | ICD-10-CM | POA: Diagnosis not present

## 2017-10-29 ENCOUNTER — Other Ambulatory Visit: Payer: Self-pay | Admitting: Family Medicine

## 2017-10-29 DIAGNOSIS — Z1231 Encounter for screening mammogram for malignant neoplasm of breast: Secondary | ICD-10-CM

## 2017-11-09 ENCOUNTER — Ambulatory Visit
Admission: RE | Admit: 2017-11-09 | Discharge: 2017-11-09 | Disposition: A | Discharge status: Home / Self Care | Payer: Medicare Other | Source: Ambulatory Visit | Attending: Family Medicine | Admitting: Family Medicine

## 2017-11-09 ENCOUNTER — Other Ambulatory Visit: Payer: Self-pay | Admitting: Family Medicine

## 2017-11-09 DIAGNOSIS — Z1231 Encounter for screening mammogram for malignant neoplasm of breast: Secondary | ICD-10-CM | POA: Insufficient documentation

## 2018-02-05 ENCOUNTER — Emergency Department
Admission: EM | Admit: 2018-02-05 | Discharge: 2018-02-05 | Disposition: A | Discharge status: Home / Self Care | Payer: No Typology Code available for payment source | Attending: Emergency Medicine | Admitting: Emergency Medicine

## 2018-02-05 ENCOUNTER — Emergency Department: Payer: No Typology Code available for payment source

## 2018-02-05 ENCOUNTER — Other Ambulatory Visit: Payer: Self-pay

## 2018-02-05 DIAGNOSIS — M25562 Pain in left knee: Secondary | ICD-10-CM | POA: Diagnosis not present

## 2018-02-05 DIAGNOSIS — F0390 Unspecified dementia without behavioral disturbance: Secondary | ICD-10-CM | POA: Diagnosis not present

## 2018-02-05 DIAGNOSIS — Z87891 Personal history of nicotine dependence: Secondary | ICD-10-CM | POA: Diagnosis not present

## 2018-02-05 DIAGNOSIS — Z23 Encounter for immunization: Secondary | ICD-10-CM | POA: Insufficient documentation

## 2018-02-05 DIAGNOSIS — Z79899 Other long term (current) drug therapy: Secondary | ICD-10-CM | POA: Insufficient documentation

## 2018-02-05 DIAGNOSIS — M79631 Pain in right forearm: Secondary | ICD-10-CM | POA: Diagnosis present

## 2018-02-05 DIAGNOSIS — E039 Hypothyroidism, unspecified: Secondary | ICD-10-CM | POA: Diagnosis not present

## 2018-02-05 MED ORDER — CYCLOBENZAPRINE HCL 5 MG PO TABS: 5.0000 mg | ORAL_TABLET | Freq: Three times a day (TID) | ORAL | 0 refills | Status: AC | PRN

## 2018-02-05 MED ORDER — TETANUS-DIPHTH-ACELL PERTUSSIS 5-2.5-18.5 LF-MCG/0.5 IM SUSP
0.5000 mL | Freq: Once | INTRAMUSCULAR | Status: AC
  Administered 2018-02-05: 0.5 mL via INTRAMUSCULAR
  Filled 2018-02-05: qty 0.5

## 2018-02-05 MED ORDER — ACETAMINOPHEN 325 MG PO TABS
650.0000 mg | ORAL_TABLET | Freq: Once | ORAL | Status: AC
  Administered 2018-02-05: 650 mg via ORAL
  Filled 2018-02-05: qty 2
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
## 2018-03-11 ENCOUNTER — Ambulatory Visit: Admission: RE | Admit: 2018-03-11 | Payer: Medicare Other | Source: Ambulatory Visit | Admitting: Gastroenterology

## 2018-03-11 ENCOUNTER — Encounter: Admission: RE | Payer: Self-pay | Source: Ambulatory Visit

## 2018-03-11 SURGERY — ESOPHAGOGASTRODUODENOSCOPY (EGD) WITH PROPOFOL
Anesthesia: General

## 2018-04-11 ENCOUNTER — Other Ambulatory Visit: Payer: Self-pay | Admitting: Gastroenterology

## 2018-04-11 DIAGNOSIS — R1013 Epigastric pain: Secondary | ICD-10-CM

## 2018-04-11 DIAGNOSIS — R1033 Periumbilical pain: Secondary | ICD-10-CM

## 2018-04-14 ENCOUNTER — Ambulatory Visit
Admission: RE | Admit: 2018-04-14 | Discharge: 2018-04-14 | Disposition: A | Discharge status: Home / Self Care | Payer: Medicare Other | Source: Ambulatory Visit | Attending: Gastroenterology | Admitting: Gastroenterology

## 2018-04-14 DIAGNOSIS — R1033 Periumbilical pain: Secondary | ICD-10-CM | POA: Diagnosis present

## 2018-04-14 DIAGNOSIS — R1013 Epigastric pain: Secondary | ICD-10-CM | POA: Diagnosis not present

## 2018-04-14 MED ORDER — IOPAMIDOL (ISOVUE-300) INJECTION 61%
100.0000 mL | Freq: Once | INTRAVENOUS | Status: AC | PRN
  Administered 2018-04-14: 100 mL via INTRAVENOUS

## 2018-06-13 ENCOUNTER — Encounter: Payer: Self-pay | Admitting: *Deleted

## 2018-06-14 ENCOUNTER — Encounter
Admission: RE | Disposition: A | Discharge status: Home / Self Care | Payer: Self-pay | Source: Ambulatory Visit | Attending: Gastroenterology

## 2018-06-14 ENCOUNTER — Ambulatory Visit
Admission: RE | Admit: 2018-06-14 | Discharge: 2018-06-14 | Disposition: A | Discharge status: Home / Self Care | Payer: Medicare Other | Source: Ambulatory Visit | Attending: Gastroenterology | Admitting: Gastroenterology

## 2018-06-14 ENCOUNTER — Ambulatory Visit: Payer: Medicare Other | Admitting: Certified Registered Nurse Anesthetist

## 2018-06-14 ENCOUNTER — Encounter: Payer: Self-pay | Admitting: *Deleted

## 2018-06-14 DIAGNOSIS — K625 Hemorrhage of anus and rectum: Secondary | ICD-10-CM | POA: Diagnosis present

## 2018-06-14 DIAGNOSIS — F0390 Unspecified dementia without behavioral disturbance: Secondary | ICD-10-CM | POA: Diagnosis not present

## 2018-06-14 DIAGNOSIS — E039 Hypothyroidism, unspecified: Secondary | ICD-10-CM | POA: Insufficient documentation

## 2018-06-14 DIAGNOSIS — K317 Polyp of stomach and duodenum: Secondary | ICD-10-CM | POA: Insufficient documentation

## 2018-06-14 DIAGNOSIS — K219 Gastro-esophageal reflux disease without esophagitis: Secondary | ICD-10-CM | POA: Diagnosis not present

## 2018-06-14 DIAGNOSIS — Z87891 Personal history of nicotine dependence: Secondary | ICD-10-CM | POA: Insufficient documentation

## 2018-06-14 DIAGNOSIS — Z886 Allergy status to analgesic agent status: Secondary | ICD-10-CM | POA: Diagnosis not present

## 2018-06-14 DIAGNOSIS — R1084 Generalized abdominal pain: Secondary | ICD-10-CM | POA: Diagnosis not present

## 2018-06-14 DIAGNOSIS — R194 Change in bowel habit: Secondary | ICD-10-CM | POA: Insufficient documentation

## 2018-06-14 DIAGNOSIS — K640 First degree hemorrhoids: Secondary | ICD-10-CM | POA: Insufficient documentation

## 2018-06-14 DIAGNOSIS — D123 Benign neoplasm of transverse colon: Secondary | ICD-10-CM | POA: Insufficient documentation

## 2018-06-14 DIAGNOSIS — Z885 Allergy status to narcotic agent status: Secondary | ICD-10-CM | POA: Diagnosis not present

## 2018-06-14 DIAGNOSIS — K621 Rectal polyp: Secondary | ICD-10-CM | POA: Diagnosis not present

## 2018-06-14 DIAGNOSIS — M1990 Unspecified osteoarthritis, unspecified site: Secondary | ICD-10-CM | POA: Insufficient documentation

## 2018-06-14 DIAGNOSIS — Z79899 Other long term (current) drug therapy: Secondary | ICD-10-CM | POA: Diagnosis not present

## 2018-06-14 DIAGNOSIS — K449 Diaphragmatic hernia without obstruction or gangrene: Secondary | ICD-10-CM | POA: Insufficient documentation

## 2018-06-14 DIAGNOSIS — R140 Abdominal distension (gaseous): Secondary | ICD-10-CM | POA: Insufficient documentation

## 2018-06-14 DIAGNOSIS — D124 Benign neoplasm of descending colon: Secondary | ICD-10-CM | POA: Diagnosis not present

## 2018-06-14 DIAGNOSIS — D122 Benign neoplasm of ascending colon: Secondary | ICD-10-CM | POA: Diagnosis not present

## 2018-06-14 DIAGNOSIS — K2950 Unspecified chronic gastritis without bleeding: Secondary | ICD-10-CM | POA: Insufficient documentation

## 2018-06-14 HISTORY — PX: COLONOSCOPY WITH PROPOFOL: SHX5780

## 2018-06-14 HISTORY — PX: ESOPHAGOGASTRODUODENOSCOPY (EGD) WITH PROPOFOL: SHX5813

## 2018-06-14 SURGERY — ESOPHAGOGASTRODUODENOSCOPY (EGD) WITH PROPOFOL
Anesthesia: General

## 2018-06-14 MED ORDER — PROPOFOL 10 MG/ML IV BOLUS
INTRAVENOUS | Status: AC
  Filled 2018-06-14: qty 20

## 2018-06-14 MED ORDER — GLYCOPYRROLATE 0.2 MG/ML IJ SOLN
INTRAMUSCULAR | Status: AC
  Filled 2018-06-14: qty 2

## 2018-06-14 MED ORDER — PROPOFOL 500 MG/50ML IV EMUL
INTRAVENOUS | Status: AC
  Filled 2018-06-14: qty 200

## 2018-06-14 MED ORDER — GLYCOPYRROLATE 0.2 MG/ML IJ SOLN
INTRAMUSCULAR | Status: DC | PRN
  Administered 2018-06-14: 0.2 mg via INTRAVENOUS

## 2018-06-14 MED ORDER — LIDOCAINE HCL (CARDIAC) PF 100 MG/5ML IV SOSY
PREFILLED_SYRINGE | INTRAVENOUS | Status: DC | PRN
  Administered 2018-06-14: 100 mg via INTRAVENOUS

## 2018-06-14 MED ORDER — PROPOFOL 500 MG/50ML IV EMUL
INTRAVENOUS | Status: DC | PRN
  Administered 2018-06-14: 150 ug/kg/min via INTRAVENOUS

## 2018-06-14 MED ORDER — SODIUM CHLORIDE 0.9 % IV SOLN
INTRAVENOUS | Status: DC
  Administered 2018-06-14: 1000 mL via INTRAVENOUS

## 2018-06-14 MED ORDER — LIDOCAINE HCL (PF) 2 % IJ SOLN
INTRAMUSCULAR | Status: AC
  Filled 2018-06-14: qty 30

## 2018-06-14 MED ORDER — PROPOFOL 10 MG/ML IV BOLUS
INTRAVENOUS | Status: DC | PRN
  Administered 2018-06-14: 50 mg via INTRAVENOUS
  Administered 2018-06-14 (×2): 20 mg via INTRAVENOUS
  Administered 2018-06-14: 30 mg via INTRAVENOUS
  Administered 2018-06-14: 20 mg via INTRAVENOUS

## 2018-06-14 MED ORDER — PHENYLEPHRINE HCL 10 MG/ML IJ SOLN
INTRAMUSCULAR | Status: DC | PRN
  Administered 2018-06-14: 100 ug via INTRAVENOUS

## 2018-06-14 NOTE — Anesthesia Procedure Notes (Signed)
Performed by: Katrese Shell, CRNA Pre-anesthesia Checklist: Patient identified, Emergency Drugs available, Suction available, Patient being monitored and Timeout performed Patient Re-evaluated:Patient Re-evaluated prior to induction Oxygen Delivery Method: Nasal cannula Induction Type: IV induction       

## 2018-06-14 NOTE — Anesthesia Postprocedure Evaluation (Signed)
Anesthesia Post Note  Patient: Cassie Carlson  Procedure(s) Performed: ESOPHAGOGASTRODUODENOSCOPY (EGD) WITH PROPOFOL (N/A ) COLONOSCOPY WITH PROPOFOL (N/A )  Patient location during evaluation: Endoscopy Anesthesia Type: General Level of consciousness: awake and alert Pain management: pain level controlled Vital Signs Assessment: post-procedure vital signs reviewed and stable Respiratory status: spontaneous breathing and respiratory function stable Cardiovascular status: stable Anesthetic complications: no     Last Vitals:  Vitals:   06/14/18 0940 06/14/18 0950  BP: (!) 108/91 128/72  Pulse: (!) 57 (!) 107  Resp: 19 11  Temp:    SpO2: 100% 91%    Last Pain:  Vitals:   06/14/18 0910  TempSrc: Tympanic  PainSc:                  [name]

## 2018-06-14 NOTE — Anesthesia Post-op Follow-up Note (Signed)
Anesthesia QCDR form completed.        

## 2018-06-14 NOTE — Anesthesia Preprocedure Evaluation (Signed)
Anesthesia Evaluation  Patient identified by MRN, date of birth, ID band Patient awake    Reviewed: Allergy & Precautions, NPO status , Patient's Chart, lab work & pertinent test results  History of Anesthesia Complications (+) PONV and history of anesthetic complications  Airway Mallampati: III       Dental   Pulmonary neg sleep apnea, neg COPD, former smoker,           Cardiovascular (-) hypertension(-) Past MI and (-) CHF (-) dysrhythmias (-) Valvular Problems/Murmurs     Neuro/Psych neg Seizures Dementia    GI/Hepatic Neg liver ROS, GERD  Medicated and Poorly Controlled,  Endo/Other  neg diabetesHypothyroidism   Renal/GU negative Renal ROS     Musculoskeletal   Abdominal   Peds  Hematology   Anesthesia Other Findings   Reproductive/Obstetrics                             Anesthesia Physical Anesthesia Plan  ASA: II  Anesthesia Plan: General   Post-op Pain Management:    Induction: Intravenous  PONV Risk Score and Plan: Propofol infusion, TIVA and Midazolam  Airway Management Planned: Nasal Cannula  Additional Equipment:   Intra-op Plan:   Post-operative Plan:   Informed Consent: I have reviewed the patients History and Physical, chart, labs and discussed the procedure including the risks, benefits and alternatives for the proposed anesthesia with the patient or authorized representative who has indicated his/her understanding and acceptance.     Plan Discussed with:   Anesthesia Plan Comments:         Anesthesia Quick Evaluation

## 2018-06-14 NOTE — Op Note (Addendum)
[facility] Gastroenterology Patient Name: Cassie Carlson Procedure Date: 06/14/2018 7:38 AM MRN: [id number] Account #: [id number] Date of Birth: [birth year] Admit Type: Outpatient Age: 72 Room: [facility] ENDO ROOM 2 Gender: Female Note Status: Finalized Procedure:            Upper GI endoscopy Indications:          Epigastric abdominal pain, Generalized abdominal pain,                        Abdominal bloating Providers:            Lollie Sails, MD Referring MD:         No Local Md, MD (Referring MD) Medicines:            Monitored Anesthesia Care Complications:        No immediate complications. Procedure:            Pre-Anesthesia Assessment:                       - ASA Grade Assessment: II - A patient with mild                        systemic disease.                       After obtaining informed consent, the endoscope was                        passed under direct vision. Throughout the procedure,                        the patient's blood pressure, pulse, and oxygen                        saturations were monitored continuously. The Endoscope                        was introduced through the mouth, and advanced to the                        third part of duodenum. The upper GI endoscopy was                        accomplished without difficulty. The patient tolerated                        the procedure well. Findings:      A small hiatal hernia was found. The Z-line was a variable distance from       incisors; the hiatal hernia was sliding.      The Z-line was variable. Biopsies were taken with a cold forceps for       histology.      The exam of the esophagus was otherwise normal.      Many 2 to 9 mm pedunculated and sessile polyps with no bleeding and no       stigmata of recent bleeding were found on the greater curvature of the       gastric body, very prominant in the mid body of the gastric vault. These       all appear grossly to be fundic gland  polyps. Biopsies  were taken with a       cold forceps for histology.      Diffuse minimal inflammation characterized by erythema was found in the       distal gastric body. Biopsies were taken with a cold forceps for       histology. The antrum mucosa showed minimal involvement. Biopsies also       taken from the antrum and placed into a separate jar.      The cardia and gastric fundus were normal on retroflexion otherwise.      The examined duodenum was normal. Biopsies were taken with a cold       forceps for histology. Impression:           - Small hiatal hernia.                       - Z-line variable. Biopsied.                       - Many gastric polyps. Biopsied.                       - Gastritis. Biopsied.                       - Normal examined duodenum. Biopsied. Recommendation:       - Check gastrin today.                       - Use Protonix (pantoprazole) 40 mg PO daily for 2                        weeks.                       - Return to GI clinic in 2 weeks.                       - Use sucralfate tablets 1 gram PO QID. Procedure Code(s):    --- Professional ---                       [phone], Esophagogastroduodenoscopy, flexible, transoral;                        with biopsy, single or multiple Diagnosis Code(s):    --- Professional ---                       K44.9, Diaphragmatic hernia without obstruction or                        gangrene                       K22.8, Other specified diseases of esophagus                       K31.7, Polyp of stomach and duodenum                       K29.70, Gastritis, unspecified, without bleeding                       R10.13, Epigastric pain  R10.84, Generalized abdominal pain                       R14.0, Abdominal distension (gaseous) CPT copyright 2017 American Medical Association. All rights reserved. The codes documented in this report are preliminary and upon coder review may  be revised to meet current  compliance requirements. Lollie Sails, MD 06/14/2018 8:10:21 AM This report has been signed electronically. Number of Addenda: 0 Note Initiated On: 06/14/2018 7:38 AM      [facility]

## 2018-06-14 NOTE — H&P (Signed)
Outpatient short stay form Pre-procedure 06/14/2018 7:21 AM Lollie Sails MD  Primary Physician: Corena Herter MD  Reason for visit: EGD and colonoscopy  History of present illness: Patient is a 72 year old female presenting today as above.  She does have a history of gas esophageal reflux for which she is been taking Dexilant.  Had worsening symptoms.  She avoids a number of different foods reports and nausea after eating.  She denies dysphagia.  She sounds like she may have a dysmotility from description of her symptoms.  He gives a variable account of her symptoms ranging from loose stools to constipation herbal bowel habits rectal bleeding then none.  He has some abdominal bloating mostly above the umbilicus.  Apparently taking some Carafate twice a day.  He has personal history of colonic adenoma as well as gastritis and GERD.  The possibility of lactose intolerance from her responses and difficulty with drinking milk products.    Current Facility-Administered Medications:  .  0.9 %  sodium chloride infusion, , Intravenous, Continuous, Lollie Sails, MD  Medications Prior to Admission  Medication Sig Dispense Refill Last Dose  . acetaminophen (TYLENOL) 500 MG tablet Take 500-1,000 mg by mouth every 6 (six) hours as needed for mild pain or moderate pain (pt can take up to 2 tablets for pain).   Past Week at Unknown time  . dexlansoprazole (DEXILANT) 60 MG capsule Take 60 mg by mouth daily.   06/13/2018 at Unknown time  . levothyroxine (SYNTHROID, LEVOTHROID) 25 MCG tablet Take 25 mcg by mouth daily before breakfast.   06/13/2018 at Unknown time  . sucralfate (CARAFATE) 1 GM/10ML suspension Take 1 g by mouth 4 (four) times daily as needed (for acid reflux).    06/13/2018 at Unknown time  . venlafaxine XR (EFFEXOR-XR) 150 MG 24 hr capsule Take 150 mg by mouth daily.   06/13/2018 at Unknown time  . gabapentin (NEURONTIN) 300 MG capsule Take 300 mg by mouth at bedtime.   Not Taking at  Unknown time  . HYDROcodone-acetaminophen (NORCO/VICODIN) 5-325 MG per tablet Take 1-2 tablets by mouth every 4 (four) hours as needed (mild pain). (Patient not taking: Reported on 06/14/2018) 30 tablet 0 Not Taking at Unknown time     Allergies  Allergen Reactions  . Asa [Aspirin] Nausea And Vomiting  . Codeine   . Ibuprofen Nausea And Vomiting     Past Medical History:  Diagnosis Date  . Arthritis   . Dementia   . Fibroids    excessive vaginal bleeding  . GERD (gastroesophageal reflux disease)   . Headache   . Hypothyroidism   . PONV (postoperative nausea and vomiting)     Review of systems:      Physical Exam    Heart and lungs: With and without rub or gallop, lungs are bilaterally clear.    HEENT: Normocephalic atraumatic eyes are anicteric    Other:    Pertinant exam for procedure: Soft nontender, mild discomfort generally.  No masses or rebound.  All sounds positive normoactive    Planned proceedures: EGD, colonoscopy and indicated procedures. I have discussed the risks benefits and complications of procedures to include not limited to bleeding, infection, perforation and the risk of sedation and the patient wishes to proceed.    Lollie Sails, MD Gastroenterology 06/14/2018  7:21 AM

## 2018-06-14 NOTE — Op Note (Addendum)
[facility] Patient Name: Cassie Carlson Procedure Date: 06/14/2018 7:37 AM MRN: [id number] Account #: [id number] Date of Birth: [birth year] Admit Type: Outpatient Age: 72 Room: [facility] ENDO ROOM 2 Gender: Female Note Status: Finalized Procedure:            Colonoscopy Indications:          Rectal bleeding, Change in bowel habits Providers:            Lollie Sails, MD Referring MD:         No Local Md, MD (Referring MD) Complications:        No immediate complications. Procedure:            Pre-Anesthesia Assessment:                       - ASA Grade Assessment: II - A patient with mild                        systemic disease.                       After obtaining informed consent, the colonoscope was                        passed under direct vision. Throughout the procedure,                        the patient's blood pressure, pulse, and oxygen                        saturations were monitored continuously. The was                        introduced through the anus and advanced to the the                        cecum, identified by appendiceal orifice and ileocecal                        valve. The colonoscopy was unusually difficult due to a                        tortuous colon. The patient tolerated the procedure                        well. The quality of the bowel preparation was fair. Findings:      A 3 mm polyp was found in the proximal ascending colon. The polyp was       sessile. The polyp was removed with a cold biopsy forceps. Resection and       retrieval were complete.      A 3 mm polyp was found in the distal ascending colon. The polyp was       sessile. The polyp was removed with a cold biopsy forceps. Resection and       retrieval were complete.      Biopsies for histology were taken with a cold forceps from the right       colon and left colon for evaluation of microscopic colitis.      Three sessile polyps were found in the  transverse colon. The polyps were  2 to 3 mm in size. These polyps were removed with a cold biopsy forceps.       Resection and retrieval were complete.      A 50 mm polyp was found in the distal transverse colon. The polyp was       sessile. Biopsies were taken with a cold forceps for histology. Area was       tattooed with an injection of 4 mL of [place] ink.      A 3 mm polyp was found in the descending colon. The polyp was sessile.       The polyp was removed with a cold biopsy forceps. Resection and       retrieval were complete.      Three sessile polyps were found in the rectum. The polyps were 1 to 2 mm       in size. These polyps were removed with a cold biopsy forceps. Resection       and retrieval were complete.      Non-bleeding internal hemorrhoids were found during anoscopy. The       hemorrhoids were small and Grade I (internal hemorrhoids that do not       prolapse).      The digital rectal exam was normal otherwise. Impression:           - Preparation of the colon was fair.                       - One 3 mm polyp in the proximal ascending colon,                        removed with a cold biopsy forceps. Resected and                        retrieved.                       - One 3 mm polyp in the distal ascending colon, removed                        with a cold biopsy forceps. Resected and retrieved.                       - Three 2 to 3 mm polyps in the transverse colon,                        removed with a cold biopsy forceps. Resected and                        retrieved.                       - One 50 mm polyp in the distal transverse colon.                        Biopsied. Tattooed.                       - One 3 mm polyp in the descending colon, removed with                        a cold biopsy forceps. Resected and retrieved.                       -  Three 1 to 2 mm polyps in the rectum, removed with a                        cold biopsy forceps. Resected and  retrieved.                       - Non-bleeding internal hemorrhoids.                       - Biopsies were taken with a cold forceps from the                        right colon and left colon for evaluation of                        microscopic colitis. Recommendation:       - Await pathology results.                       - Telephone GI clinic for pathology results in 1 week.                       - Return to GI clinic in 3 weeks. Procedure Code(s):    --- Professional ---                       [phone], Colonoscopy, flexible; with biopsy, single or                        multiple                       [id number], Colonoscopy, flexible; with directed submucosal                        injection(s), any substance Diagnosis Code(s):    --- Professional ---                       K64.0, First degree hemorrhoids                       D12.2, Benign neoplasm of ascending colon                       D12.3, Benign neoplasm of transverse colon (hepatic                        flexure or splenic flexure)                       D12.4, Benign neoplasm of descending colon                       K62.1, Rectal polyp                       K62.5, Hemorrhage of anus and rectum                       R19.4, Change in bowel habit CPT copyright 2017 American Medical Association. All rights reserved. The codes documented in this report are preliminary and upon coder review may  be revised to meet current compliance requirements.  Lollie Sails, MD 06/14/2018 9:22:56 AM This report has been signed electronically. Number of Addenda: 0 Note Initiated On: 06/14/2018 7:37 AM Scope Withdrawal Time: 0 hours 46 minutes 6 seconds  Total Procedure Duration: 0 hours 58 minutes 49 seconds       [facility]

## 2018-06-14 NOTE — Transfer of Care (Signed)
Immediate Anesthesia Transfer of Care Note  Patient: Cassie Carlson  Procedure(s) Performed: ESOPHAGOGASTRODUODENOSCOPY (EGD) WITH PROPOFOL (N/A ) COLONOSCOPY WITH PROPOFOL (N/A )  Patient Location: PACU  Anesthesia Type:General  Level of Consciousness: awake and alert   Airway & Oxygen Therapy: Patient Spontanous Breathing  Post-op Assessment: Report given to RN and Post -op Vital signs reviewed and stable  Post vital signs: Reviewed and stable  Last Vitals:  Vitals Value Taken Time  BP    Temp 36.4 C 06/14/2018  9:10 AM  Pulse 78 06/14/2018  9:19 AM  Resp 15 06/14/2018  9:19 AM  SpO2 98 % 06/14/2018  9:19 AM  Vitals shown include unvalidated device data.  Last Pain:  Vitals:   06/14/18 0910  TempSrc: Tympanic  PainSc:          Complications: No apparent anesthesia complications

## 2018-06-16 ENCOUNTER — Encounter: Payer: Self-pay | Admitting: Gastroenterology

## 2018-06-17 LAB — SURGICAL PATHOLOGY

## 2018-07-08 ENCOUNTER — Encounter: Payer: Self-pay | Admitting: Genetic Counselor

## 2018-07-08 ENCOUNTER — Telehealth: Payer: Self-pay | Admitting: Genetic Counselor

## 2018-07-08 NOTE — Telephone Encounter (Signed)
A genetic counseling appt has been scheduled for the pt to see Roma Kayser on 10/16 at 1pm. Pt aware to arrive 15 minutes early. Letter mailed.

## 2018-07-18 ENCOUNTER — Encounter: Payer: Self-pay | Admitting: Genetics

## 2018-07-18 ENCOUNTER — Inpatient Hospital Stay: Payer: Medicare Other

## 2018-07-18 ENCOUNTER — Inpatient Hospital Stay: Payer: Medicare Other | Attending: Genetic Counselor | Admitting: Genetics

## 2018-07-18 DIAGNOSIS — Z8042 Family history of malignant neoplasm of prostate: Secondary | ICD-10-CM | POA: Insufficient documentation

## 2018-07-18 DIAGNOSIS — Z803 Family history of malignant neoplasm of breast: Secondary | ICD-10-CM

## 2018-07-18 DIAGNOSIS — K635 Polyp of colon: Secondary | ICD-10-CM

## 2018-07-18 DIAGNOSIS — Z800 Family history of malignant neoplasm of digestive organs: Secondary | ICD-10-CM

## 2018-07-20 ENCOUNTER — Encounter: Payer: Self-pay | Admitting: Genetics

## 2018-07-20 DIAGNOSIS — K635 Polyp of colon: Secondary | ICD-10-CM | POA: Insufficient documentation

## 2018-07-20 NOTE — Progress Notes (Addendum)
REFERRING PROVIDER: Lollie Sails, MD [facility] [place], [place] [address]  PRIMARY PROVIDER:  Zeb Comfort, MD  PRIMARY REASON FOR VISIT:  1. Polyposis of colon   2. Family history of breast cancer   3. Family history of colon cancer   4. Family history of prostate cancer     HISTORY OF PRESENT ILLNESS:   Cassie Carlson, a 72 y.o. female, was seen for a [facility] cancer genetics consultation at the request of Dr. Gustavo Lah due to a personal history of colon polyps and family history of cancer.  Cassie Carlson presents to clinic today to discuss the possibility of a hereditary predisposition to cancer, genetic testing, and to further clarify her future cancer risks, as well as potential cancer risks for family members.   Cassie Carlson reports she has had estimated 10-20 colon polyps removed over her lifetime.  Pathology reports available for review from 2015 and her most recent colonoscopy in Aug 2019 identify 9 adenomatous polyps.  Cassie Carlson also has a significant history of many stomach polyps. She is going to be having one of the large polyps identified removed surgically.  She reports her doctors would like to know her genetic testing results prior to surgery as it may impact their approach/plan.   HORMONAL RISK FACTORS:  Menarche was at age 19.  First live birth at age 89.  Ovaries intact: no.  Hysterectomy: yes. Total hysterectomy at 43 due to 'very cystic ovaries and uterus'. Menopausal status: postmenopausal.  HRT use: 15 years. Colonoscopy: yes; abnormal. See comments above Mammogram within the last year: yes. Number of breast biopsies: 0.  Past Medical History:  Diagnosis Date  . Arthritis   . Dementia ([place])   . Family history of breast cancer   . Family history of colon cancer   . Family history of colon cancer   . Family history of prostate cancer   . Family history of prostate cancer   . Fibroids    excessive vaginal bleeding  . GERD  (gastroesophageal reflux disease)   . Headache   . Hypothyroidism   . PONV (postoperative nausea and vomiting)     Past Surgical History:  Procedure Laterality Date  . ABDOMINAL HYSTERECTOMY    . ANTERIOR CERVICAL DECOMP/DISCECTOMY FUSION N/A 01/01/2015   Procedure: ANTERIOR CERVICAL DECOMPRESSION/DISCECTOMY FUSION CERVICAL FIVE-SIX,CERVICAL SIX-SEVEN;  Surgeon: Karie Chimera, MD;  Location: [place] [facility]URO [facility]S;  Service: Neurosurgery;  Laterality: N/A;  . AUGMENTATION MAMMAPLASTY Bilateral 1990  . BREAST SURGERY    . CHOLECYSTECTOMY    . COLONOSCOPY WITH PROPOFOL N/A 06/14/2018   Procedure: COLONOSCOPY WITH PROPOFOL;  Surgeon: Lollie Sails, MD;  Location: [facility] ENDOSCOPY;  Service: Endoscopy;  Laterality: N/A;  . DILATION AND CURETTAGE OF UTERUS    . ESOPHAGOGASTRODUODENOSCOPY (EGD) WITH PROPOFOL N/A 06/14/2018   Procedure: ESOPHAGOGASTRODUODENOSCOPY (EGD) WITH PROPOFOL;  Surgeon: Lollie Sails, MD;  Location: [facility] ENDOSCOPY;  Service: Endoscopy;  Laterality: N/A;  . EUS  08/18/2012   Procedure: UPPER ENDOSCOPIC ULTRASOUND (EUS) LINEAR;  Surgeon: Milus Banister, MD;  Location: [facility] ENDOSCOPY;  Service: Endoscopy;  Laterality: N/A;  . REDUCTION MAMMAPLASTY Bilateral 1990  . sholder    . VEIN LIGATION AND STRIPPING      Social History   Socioeconomic History  . Marital status: Divorced    Spouse name: Not on file  . Number of children: 2  . Years of education: Not on file  . Highest education level: Not on file  Occupational History  . Not on file  Social Needs  . Financial resource strain: Not on file  . Food insecurity:    Worry: Not on file    Inability: Not on file  . Transportation needs:    Medical: Not on file    Non-medical: Not on file  Tobacco Use  . Smoking status: Former Smoker    Packs/day: 2.00    Years: 5.00    Pack years: 10.00    Types: Cigarettes    Last attempt to quit: 10/19/1965    Years since quitting: 52.7  . Smokeless tobacco: Never Used   Substance and Sexual Activity  . Alcohol use: No  . Drug use: Never  . Sexual activity: Not on file  Lifestyle  . Physical activity:    Days per week: Not on file    Minutes per session: Not on file  . Stress: Not on file  Relationships  . Social connections:    Talks on phone: Not on file    Gets together: Not on file    Attends religious service: Not on file    Active member of club or organization: Not on file    Attends meetings of clubs or organizations: Not on file    Relationship status: Not on file  Other Topics Concern  . Not on file  Social History Narrative  . Not on file     FAMILY HISTORY:  We obtained a detailed, 4-generation family history.  Significant diagnoses are listed below: Family History  Problem Relation Age of Onset  . Breast cancer Mother 2  . Lung cancer Mother   . Prostate cancer Father        dx 20's  . Bladder Cancer Father   . Colon cancer Father 44  . Throat cancer Sister   . Lung cancer Sister   . Lung cancer Brother        agent orange, hx of smoking  . Brain cancer Brother   . Leukemia Paternal Aunt   . Prostate cancer Paternal Grandfather 14  . Lymphoma Other 35    Cassie Carlson has a 42 year-old daughter with no history of cancer.  She has a hx of pancreatitis.  Cassie Carlson has 2 grandchildren.  Cassie Carlson has 1 sister and 2 brothers: -1 sister died at 67 due to throat and lung cancer.  She had a hx of smoking.  -1 brother died at 72 due to brain and lung cancer.  unk if 2 primaries or met.  He had exposure to agent orange.  -1 brother with no known history of caner, but limited info available  Cassie Carlson's father: died at 22.  He had prostate, bladder, and colon cancer over a period of 8 years.  She does not know if these were all primary cancers or if any were metastases.  Paternal Aunts/Uncles: 2 paternal uncles. 1 died in a drowning accident, the other died of leukemia.   Paternal cousins: limited info, but no known hx of  cancer Paternal grandfather: died in his 63's with prostate cancer dx in 21's Paternal grandmother:died of age related disease  Cassie Carlson's mother: died at 19 due to breast cancer.  She also had lung cancer, unk if a met or primary. Maternal Aunts/Uncles: 3 maternal uncles died of alcohol related disease. 2 maternal aunts with no hx of cancer.  Maternal cousins: no known hx of cancer Maternal grandfather: died of cirrhosis of the liver.  Maternal grandmother:died before patient  was born, no info.  Her sister died of lymphoma in her 20's/30's.   Cassie Carlson is unaware of previous family history of genetic testing for hereditary cancer risks. Patient's maternal ancestors are of N. European/Native American descent, and paternal ancestors are of N. European descent. There is no reported Ashkenazi Jewish ancestry. There is no known consanguinity.  GENETIC COUNSELING ASSESSMENT: Cassie Carlson is a 72 y.o. female with a personal and family history which is somewhat suggestive of a Hereditary Cancer Predisposition Syndrome. We, therefore, discussed and recommended the following at today's visit.   DISCUSSION: We reviewed the characteristics, features and inheritance patterns of hereditary cancer syndromes. We also discussed genetic testing, including the appropriate family members to test, the process of testing, insurance coverage and turn-around-time for results. We discussed the implications of a negative, positive and/or variant of uncertain significant result. We recommended Cassie Carlson pursue genetic testing for the Multi-Cancer gene panel.    The Multi-Cancer Panel offered by [facility] includes sequencing and/or deletion duplication testing of the following 91 genes: AIP, ALK, APC, ATM, AXIN2, BAP1, BARD1, BLM, BMPR1A, BRCA1, BRCA2, BRIP1, BUB1B, CASR, CDC73, CDH1, CDK4, CDKN1B, CDKN1C, CDKN2A, CEBPA, CEP57, CHEK2, CTNNA1, DICER1, DIS3L2, EGFR, ENG, EPCAM, FH, FLCN, GALNT12, GATA2, GPC3, GREM1, HOXB13,  HRAS, KIT, MAX, MEN1, MET, MITF, MLH1, MLH3, MSH2, MSH3, MSH6, MUTYH, NBN, NF1, NF2, NTHL1, PALB2, PDGFRA, PHOX2B, PMS2, POLD1, POLE, POT1, PRKAR1A, PTCH1, PTEN, RAD50, RAD51C, RAD51D, RB1, RECQL4, RET, RNF43, RPS20, RUNX1, SDHA, SDHAF2, SDHB, SDHC, SDHD, SMAD4, SMARCA4, SMARCB1, SMARCE1, STK11, SUFU, TERC, TERT, TMEM127, TP53, TSC1, TSC2, VHL, WRN, WT1  When an individual develops multiple adenomatous colon polyps, there is concern for a condition called Familial Adenomatous Polyposis (FAP).   In the classic form of FAP, people generally have hundreds to thousands of adenomatous polyps.  A milder version of FAP called Attenuated FAP (AFAP) is characterized by less than 100 adenomatous polyps.   There are two genes that have been associated with FAP/AFAP and each has a different pattern of inheritance.  The first gene is called APC and is inherited in an autosomal dominant fashion.  In this case, having only one alteration (mutation) in one copy of the APC gene causes polyps to develop.  In about 30% of cases caused by APC, the condition is diagnosed for the first time in a family where both parents do not have the condition.  In other words, there are 30% of people with FAP/AFAP where the mutation occurred in them for the first time.     Colon polyposis can also be caused by mutations in the MUTYH gene, which causes a condition known as MUTYH-associated polyposis.  This is an autosomal recessive genetic condition.  In this case, an individual needs to have inherited a mutation in each copy of the MYH gene, one from each parent, in order to develop polyposis.  Carrying just one mutation of the MYH gene does not typically cause any problems or place an individual at risk for cancer.    Another syndrome we were concerned about in your family is called Lynch Syndrome, also called HNPCC (Hereditary Non-Polyposis Colon Cancer).  This syndrome increases the risk for colon, uterine, ovarian and stomach cancers,  brain cancers, as well as others.  Families with Lynch Syndrome tend to have multiple family members with these cancers, typically diagnosed under age 12, and diagnoses in multiple generations. The genes that are known to cause Lynch Syndrome are called MLH1, MSH2, MSH6, PMS2 and EPCAM.  Some families that appear to have any of these conditions will have normal testing of these genes.  In those cases it is possible that the large amount of colon polyps may be causes by a syndrome called Serrated Polyposis Syndrome.  This condition causes an abnormally large amount of serrate polyps to develop and believe to be hereditary.  However, the genetic cause of this syndrome has not yet been identified.   Given her family history of young breast cancer and prostate cancer, we also discussed Hereditary Breast cancer (BRCA1/2 and other breast cancer risk genes).  Based on her family history, she meets NCCN criteria for BRCA1/2 genetic testing.     We discussed that if she is found to have a mutation in one of these genes, it may impact future medical management recommendations such as increased cancer screenings and consideration of risk reducing surgeries.  A positive result could also have implications for the patient's family members.   A Negative result would mean we were unable to identify a hereditary component to her development of adenomatous polyps, but does not rule out the possibility of a hereditary basis for her polyps. There could be mutations that are undetectable by current technology, or in genes not yet tested or identified to increase cancer risk.     We discussed the potential to find a Variant of Uncertain Significance or VUS.  These are variants that have not yet been identified as pathogenic or benign, and it is unknown if this variant is associated with increased cancer risk or if this is a normal finding.  Most VUS's are reclassified to benign or likely benign.   It should not be used to  make medical management decisions. With time, we suspect the lab will determine the significance of any VUS's identified if any.   Based on Cassie Carlson's family history of cancer, she meets medical criteria for genetic testing. Despite that she meets criteria, she may still have an out of pocket cost. The laboratory can provide her with an estimate of her OOP cost.  She was provided the contact information for the laboratory if she has further questions.   PLAN: After considering the risks, benefits, and limitations, Cassie Carlson  provided informed consent to pursue genetic testing and the blood sample was sent to [facility] for analysis of the Multi-Cancer. Results should be available within approximately 2-3 weeks' time, at which point they will be disclosed by telephone to Cassie Carlson, as will any additional recommendations warranted by these results. Cassie Carlson will receive a summary of her genetic counseling visit and a copy of her results once available. This information will also be available in Epic. We encouraged Cassie Carlson to remain in contact with cancer genetics annually so that we can continuously update the family history and inform her of any changes in cancer genetics and testing that may be of benefit for her family. Cassie Carlson questions were answered to her satisfaction today. Our contact information was provided should additional questions or concerns arise.  Based on Cassie Carlson. Cederberg's family history, we recommended her maternal relatives also have genetic counseling and testing. Cassie Carlson. Aguado will let us know if we can be of any assistance in coordinating genetic counseling and/or testing for this family member.   Lastly, we encouraged Cassie Carlson. Geissinger to remain in contact with cancer genetics annually so that we can continuously update the family history and inform her of any changes in cancer genetics and testing that may be of benefit  for this family.   Cassie Carlson.  Amyx questions were answered to her  satisfaction today. Our contact information was provided should additional questions or concerns arise. Thank you for the referral and allowing [place] to share in the care of your patient.   Tana Felts, Cassie Carlson, [facility] Certified Genetic Counselor [email or web address] phone: [phone]  The patient was seen for a total of 50 minutes in face-to-face genetic counseling.

## 2018-07-29 ENCOUNTER — Ambulatory Visit: Payer: Self-pay | Admitting: Genetics

## 2018-07-29 ENCOUNTER — Telehealth: Payer: Self-pay | Admitting: Genetics

## 2018-07-29 ENCOUNTER — Encounter: Payer: Self-pay | Admitting: Genetics

## 2018-07-29 DIAGNOSIS — Z803 Family history of malignant neoplasm of breast: Secondary | ICD-10-CM

## 2018-07-29 DIAGNOSIS — Z1379 Encounter for other screening for genetic and chromosomal anomalies: Secondary | ICD-10-CM

## 2018-07-29 DIAGNOSIS — Z800 Family history of malignant neoplasm of digestive organs: Secondary | ICD-10-CM

## 2018-07-29 DIAGNOSIS — Z8042 Family history of malignant neoplasm of prostate: Secondary | ICD-10-CM

## 2018-07-29 DIAGNOSIS — K635 Polyp of colon: Secondary | ICD-10-CM

## 2018-07-29 NOTE — Telephone Encounter (Signed)
Revealed negative genetic testing.  Revealed that a VUS in RNF43 was identified.   This normal result means we did not find a genetic cause for polyps or any hereditary predisposition to cancer.  However, genetic testing is not perfect, and cannot definitively rule out a hereditary cause.  It will be important for her to keep in contact with genetics to learn if any additional testing may be needed in the future.   We told her she is still at higher risk for cancer due to the amount of polyps she has had and that it is important for her to continue to see her GI doctors and manage her polyps.    We also recommended that other relatives (especially maternal relatives) also have genetic testing, because there could be a genetic cause for the cancers in the family that Ms. Brodrick did not inherit and therefore would not be found in her.  We also informed her that relatives are at a higher chance for polyps due to her history and that her relatives should share the family history of polyps with their doctors so they can make the best screening recommendations for them.

## 2018-07-29 NOTE — Progress Notes (Addendum)
HPI:  Ms. Lehnen was previously seen in the [facility] on 07/18/2018 due to a personal history of colon polyps and family history of cancer and concerns regarding a hereditary predisposition to cancer. Please refer to our prior cancer genetics clinic note for more information regarding Ms. Woolf's medical, social and family histories, and our assessment and recommendations, at the time. Ms. Troung recent genetic test results were disclosed to her, as well as recommendations warranted by these results. These results and recommendations are discussed in more detail below.  CANCER HISTORY:  Ms. Linebaugh reports she has had estimated 10-20 colon polyps removed over her lifetime.  Pathology reports available for review from 2015 and her most recent colonoscopy in Aug 2019 identify 9 adenomatous polyps.  Ms. Weimer also has a significant history of many stomach polyps. She is going to be having one of the large polyps identified removed surgically.  She reports her doctors would like to know her genetic testing results prior to surgery as it may impact their approach/plan.   FAMILY HISTORY:  We obtained a detailed, 4-generation family history.  Significant diagnoses are listed below: Family History  Problem Relation Age of Onset  . Breast cancer Mother 40  . Lung cancer Mother   . Prostate cancer Father        dx 30's  . Bladder Cancer Father   . Colon cancer Father 43  . Throat cancer Sister   . Lung cancer Sister   . Lung cancer Brother        agent orange, hx of smoking  . Brain cancer Brother   . Leukemia Paternal Aunt   . Prostate cancer Paternal Grandfather 68  . Lymphoma Other 35    Ms. Walrond has a 17 year-old daughter with no history of cancer.  She has a hx of pancreatitis.  Ms. Borrayo has 2 grandchildren.  Ms. Junod has 1 sister and 2 brothers: -1 sister died at 110 due to throat and lung cancer.  She had a hx of smoking.  -1 brother died at 23 due to brain and lung  cancer.  unk if 2 primaries or met.  He had exposure to agent orange.  -1 brother with no known history of caner, but limited info available  Ms. Bader's father: died at 59.  He had prostate, bladder, and colon cancer over a period of 8 years.  She does not know if these were all primary cancers or if any were metastases.  Paternal Aunts/Uncles: 2 paternal uncles. 1 died in a drowning accident, the other died of leukemia.   Paternal cousins: limited info, but no known hx of cancer Paternal grandfather: died in his 29's with prostate cancer dx in 83's Paternal grandmother:died of age related disease  Ms. Rutt's mother: died at 45 due to breast cancer.  She also had lung cancer, unk if a met or primary. Maternal Aunts/Uncles: 3 maternal uncles died of alcohol related disease. 2 maternal aunts with no hx of cancer.  Maternal cousins: no known hx of cancer Maternal grandfather: died of cirrhosis of the liver.  Maternal grandmother:died before patient was born, no info.  Her sister died of lymphoma in her 20's/30's.   Ms. Bauer is unaware of previous family history of genetic testing for hereditary cancer risks. Patient's maternal ancestors are of N. European/Native American descent, and paternal ancestors are of N. European descent. There is no reported Ashkenazi Jewish ancestry. There is no known consanguinity.  GENETIC TEST RESULTS:  Genetic testing performed through [facility]'s Multi-Cancer Panel reported out on 07/27/2018 showed no pathogenic mutations. The Multi-Cancer Panel offered by [facility] includes sequencing and/or deletion duplication testing of the following 91 genes: AIP, ALK, APC, ATM, AXIN2, BAP1, BARD1, BLM, BMPR1A, BRCA1, BRCA2, BRIP1, BUB1B, CASR, CDC73, CDH1, CDK4, CDKN1B, CDKN1C, CDKN2A, CEBPA, CEP57, CHEK2, CTNNA1, DICER1, DIS3L2, EGFR, ENG, EPCAM, FH, FLCN, GALNT12, GATA2, GPC3, GREM1, HOXB13, HRAS, KIT, MAX, MEN1, MET, MITF, MLH1, MLH3, MSH2, MSH3, MSH6, MUTYH, NBN, NF1, NF2,  NTHL1, PALB2, PDGFRA, PHOX2B, PMS2, POLD1, POLE, POT1, PRKAR1A, PTCH1, PTEN, RAD50, RAD51C, RAD51D, RB1, RECQL4, RET, RNF43, RPS20, RUNX1, SDHA, SDHAF2, SDHB, SDHC, SDHD, SMAD4, SMARCA4, SMARCB1, SMARCE1, STK11, SUFU, TERC, TERT, TMEM127, TP53, TSC1, TSC2, VHL, WRN, WT1  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 c.[id number] ([id number])  The test report will be scanned into EPIC and will be located under the Molecular Pathology section of the Results Review tab. A portion of the result report is included below for reference.     We discussed with Ms. Blatz that because current genetic testing is not perfect, it is possible there may be a gene mutation in one of these genes that current testing cannot detect, but that chance is small.  We also discussed, that there could be another gene that has not yet been discovered, or that we have not yet tested, that is responsible for the cancer diagnoses in the family. It is also possible there is a hereditary cause for the cancer in the family that Ms. Meares did not inherit and therefore was not identified in her testing.  Therefore, it is important to remain in touch with cancer genetics in the future so that we can continue to offer Ms. Junod the most up to date genetic testing.   Regarding the VUS in RNF43: At this time, it is unknown if this variant is associated with increased cancer risk or if this is a normal finding, but most variants such as this get reclassified to being inconsequential. It should not be used to make medical management decisions. With time, we suspect the lab will determine the significance of this variant, if any. If we do learn more about it, we will try to contact Ms. Dunavant to discuss it further. However, it is important to stay in touch with [place] periodically and keep the address and phone number up to date.  ADDITIONAL GENETIC TESTING:We discussed with Ms. Olejniczak that her genetic testing was fairly  extensive.  If there are are genes identified to increase cancer risk that can be analyzed in the future, we would be happy to discuss and coordinate this testing at that time.    CANCER SCREENING RECOMMENDATIONS: This negative result means that we were unable to identify a hereditary cause for her history of polyps at this time.   We also did not identify any mutations associated with hereditary predisposition to cancer.    This negative result does not completely rule out a hereditary cause for her  polyps.  It is still possible that there could be genetic mutations that are undetectable by current technology, or genetic mutations in genes that have not been tested or identified to increase cancer risk.    Therefore, it is recommended she continue to follow the cancer management and screening guidelines provided by her GI and primary healthcare provider.  We explained it is still important to continue management and surveillance of her polyps, and that her personal history of many  polyps does increase her risk to develop more in the future even though her genetics were normal.   An individual's cancer risk is not determined by genetic test results alone.  Overall cancer risk assessment includes additional factors such as personal medical history, family history, etc.  These should be used to make a personalized plan for cancer prevention and surveillance.     RECOMMENDATIONS FOR FAMILY MEMBERS:  Relatives in this family might be at some increased risk of developing cancer, over the general population risk, simply due to the family history of cancer.  We recommended women in this family have a yearly mammogram beginning at age 88, or 10 years younger than the earliest onset of cancer, an annual clinical breast exam, and perform monthly breast self-exams. Women in this family should also have a gynecological exam as recommended by their primary provider. All family members should have a colonoscopy as  directed by their doctors.  It is important for Ms. Gail's daughter and other relatives to inform their healthcare providers about the family history of colon polyps as this may impact the screening they need.  All family members should inform their physicians about the family history of cancer so their doctors can make the most appropriate screening recommendations for them.   It is also possible there is a hereditary cause for the cancer in Ms. Troia's family that she did not inherit and therefore was not identified in her.   Therefore, we recommended siblings/nieces/nephews/ maternal relatives also have genetic counseling and testing. Ms. Marcoux will let us know if we can be of any assistance in coordinating genetic counseling and/or testing for these family members.   FOLLOW-UP: Lastly, we discussed with Ms. Doren that cancer genetics is a rapidly advancing field and it is possible that new genetic tests will be appropriate for her and/or her family members in the future. We encouraged her to remain in contact with cancer genetics on an annual basis so we can update her personal and family histories and let her know of advances in cancer genetics that may benefit this family.   Our contact number was provided. Ms. Rasnic questions were answered to her satisfaction, and she knows she is welcome to call us at anytime with additional questions or concerns.   Ferol Luz, MS, [facility] Certified Genetic Counselor [email or web address]

## 2018-08-03 ENCOUNTER — Encounter: Payer: PRIVATE HEALTH INSURANCE | Admitting: Genetic Counselor

## 2018-08-03 ENCOUNTER — Other Ambulatory Visit: Payer: PRIVATE HEALTH INSURANCE

## 2018-08-05 ENCOUNTER — Ambulatory Visit: Payer: Self-pay | Admitting: Surgery

## 2018-08-05 NOTE — H&P (View-Only) (Signed)
Subjective:   CC: Polyp of transverse colon, unspecified type [D12.3]  HPI:  Cassie Carlson is a 72 y.o. female returns to discuss r/b/a of upcoming laparoscopic colon resection for Unresectable transverse colon polyp.  She has an appointment with a geneticist for possible polyposis syndrome as well.  No new issues or complaints.   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  Past Surgical History:  has a past surgical history that includes Cholecystectomy; Hysterectomy (1992); Arthroscopic Rotator Cuff Repair (Left); Combined augmentation mammaplasty and abdominoplasty (1991); other surgery (Bilateral, 1991); other surgery; left rotator cuff repair (1990); Functional endoscopic sinus surgery (1991); Face Lift (01/2002); Upper gastrointestinal endoscopy (07/12/12); Colonoscopy (02/13/10); Colonoscopy (03/13/14); egd (03/13/14); Tonsillectomy (1953); Anterior fusion cervical spine (2016); Colonoscopy (06/14/2018); and egd (06/14/2018).  Family History: family history includes Brain cancer in her brother; Breast cancer (age of onset: 8) in her mother; Cancer in her father; Colon cancer (age of onset: 27) in her father; Diabetes type II in her paternal grandfather; Lung cancer in her brother, mother, and sister; Pancreatitis in her daughter; Prostate cancer in her father; Throat cancer in her sister; Ulcers in her father.  Social History:  reports that she quit smoking about 53 years ago. Her smoking use included cigarettes. She started smoking about 57 years ago. She quit after 4.00  years of use. She has never used smokeless tobacco. She reports that she does not drink alcohol or use drugs.  Current Medications: has a current medication list which includes the following prescription(s): acetaminophen, fluticasone propionate, levothyroxine, ranitidine, sucralfate, tramadol, umeclidinium, venlafaxine, metoclopramide, metronidazole, and neomycin.  Allergies:       Allergies  Allergen Reactions  . Codeine Other (See Comments)    Flushing of the face.  . Aspirin Diarrhea and Nausea  . Motrin [Ibuprofen] Diarrhea and Nausea    ROS:  A 15 point review of systems was performed and pertinent positives and negatives noted in HPI   Objective:   BP 123/81   Pulse 76   Temp 36 C (96.8 F) (Oral)   Ht 161.3 cm (5' 3.5")   Wt 84.8 kg (186 lb 15.2 oz)   BMI 32.60 kg/m   Constitutional :  alert, appears stated age, cooperative and no distress  Lymphatics/Throat:  no asymmetry, masses, or scars  Respiratory:  clear to auscultation bilaterally  Cardiovascular:  regular rate and rhythm  Gastrointestinal: soft, non-tender; bowel sounds normal; no masses,  no organomegaly.    Musculoskeletal: Steady gait and movement  Skin: Cool and moist  Psychiatric: Normal affect, non-agitated, not confused       LABS:  SPECIMEN SUBMITTED: A. Duodenum; cbxs B. Stomach,antrum; cbxs C. Stomach,body; cbxs D. Stomach polyps, multiple, upper body; cbxs E. GEJ; cbxs F. Colon polyp,proximal ascending; cbxs G. Colon polyp, distal ascending; cbx H. Colon, random right; cbxs I. Colon polyp x3, transverse; cbxs J. Colon polyp, distal transverse; cbxs K. Colon polyp, descending; cbx L. Colon,random left; cbxs M. Rectum polyp x3; cbx  CLINICAL HISTORY: None provided  PRE-OPERATIVE DIAGNOSIS: GERD, Epigastric pain, nausea, rectal bleeding, HX colon polyps  POST-OPERATIVE DIAGNOSIS: Fundic Gland polyps, hiatal hernia, diverticulosis, rectal  polyps     DIAGNOSIS: A.DUODENUM; COLD BIOPSY: -  DUODENAL MUCOSA WITH INTACT VILLI. - NEGATIVE FOR ACTIVE INFLAMMATION, INTRAEPITHELIAL LYMPHOCYTOSIS, AND INFECTIOUS AGENTS.  B.STOMACH, ANTRUM; COLD BIOPSY: - ANTRAL MUCOSA WITH CONGESTION. - NEGATIVE FOR H. PYLORI, INTESTINAL METAPLASIA, DYSPLASIA, AND MALIGNANCY.  C.STOMACH, BODY; COLD BIOPSY: - OXYNTIC MUCOSA WITH PROTON PUMP INHIBITOR EFFECT AND CONGESTION. - NEGATIVE FOR H. PYLORI, INTESTINAL METAPLASIA, DYSPLASIA, AND MALIGNANCY.  D.STOMACH POLYPS, MULTIPLE, UPPER BODY; COLD BIOPSY: - FUNDIC GLAND POLYPS, 5 FRAGMENTS. - NEGATIVE FOR DYSPLASIA AND MALIGNANCY.  E.GASTROESOPHAGEAL JUNCTION; COLD BIOPSY: - SQUAMOCOLUMNAR MUCOSA WITH MILD CHRONIC INFLAMMATION. - NEGATIVE FOR GOBLET CELLS, DYSPLASIA, AND MALIGNANCY.  F.COLON POLYP, PROXIMAL ASCENDING; COLD BIOPSY: - TUBULAR ADENOMA, NEGATIVE FOR HIGH-GRADE DYSPLASIA AND MALIGNANCY. - SESSILE SERRATED ADENOMA, NEGATIVE FOR CYTOLOGIC DYSPLASIA AND MALIGNANCY. - TWO DIFFERENT PIECES FOUND IN CONTAINER.  G.COLON POLYP, DISTAL ASCENDING; COLD BIOPSY: - TUBULAR ADENOMA, MULTIPLE FRAGMENTS. - NEGATIVE FOR HIGH-GRADE DYSPLASIA AND MALIGNANCY.  H.RIGHT COLON; RANDOM COLD BIOPSY: - NEGATIVE FOR MICROSCOPIC COLITIS AND DYSPLASIA.  I.COLON POLYP X 3, TRANSVERSE; COLD BIOPSY: - TUBULAR ADENOMAS, 5 FRAGMENTS. - NEGATIVE FOR HIGH-GRADE DYSPLASIA AND MALIGNANCY.  J.COLON POLYP, DISTAL TRANSVERSE; COLD BIOPSY: - TUBULAR ADENOMA, 4 FRAGMENTS. - NEGATIVE FOR HIGH-GRADE DYSPLASIA AND MALIGNANCY.  K.COLON POLYP, ASCENDING; COLD BIOPSY: - TUBULAR ADENOMA. - NEGATIVE FOR HIGH-GRADE DYSPLASIA AND MALIGNANCY.  L.LEFT COLON; RANDOM COLD BIOPSY: - NEGATIVE FOR MICROSCOPIC COLITIS AND DYSPLASIA.  M.RECTUM POLYP X 3; COLD BIOPSY: - HYPERPLASTIC POLYPS, 3 FRAGMENTS. - NEGATIVE FOR DYSPLASIA AND MALIGNANCY.   GROSS DESCRIPTION: A. Labeled: Cbx  duodenum Received: In formalin Tissue fragment(s): 2 Size: 0.3 and 0.5 cm Description: Tan fragments Entirely submitted in one cassette.  B. Labeled: Cbx antrum of stomach Received: In formalin Tissue fragment(s): 1 Size: 0.4 cm Description: Pink-tan fragment Entirely submitted in one cassette.  C. Labeled: Cbx body of stomach Received: In formalin Tissue fragment(s): 2 Size: 0.4-0.5 cm Description: Pink-tan fragments Entirely submitted in one cassette.  D. Labeled: Cbx upper gastric body multiple polyps Received: In formalin Tissue fragment(s): 5 Size: 0.2-0.4 cm Description: Tan fragments Entirely submitted in one cassette.  E. Labeled: Cbx GEJ Received: in formalin Tissue fragment(s): 2 Size: 0.3 and 0.4 cm Description: Pink-tan fragments Entirely submitted in one cassette.  F. Labeled: Cbx proximal ascending colon polyp Received: In formalin Tissue fragment(s): 2 Size: 0.3 and 0.4 cm Description: Tan fragments Entirely submitted in one cassette.  G. Labeled: Cbx distal ascending colon polyp Received: In formalin Tissue fragment(s): Multiple Size: Aggregate, 1.4 x 0.4 x 0.1 cm Description: tan-brown fragments Entirely submitted in one cassette.  H. Labeled: Right colon random cbx Received: In formalin Tissue fragment(s): 3 Size: 0.2-0.4 cm Description: Tan fragments Entirely submitted in one cassette.  I. Labeled: Cbx transverse colon polyp x3 Received: In formalin Tissue fragment(s): 5 Size: 0.2-0.4 cm Description: Tan fragments Entirely submitted in one cassette.  J. Labeled: Cbx transverse distal colon polyp Received: In formalin Tissue fragment(s): 4 Size: 0.2-0.4 cm Description: Pink-tan fragments Entirely submitted in one cassette.  K. Labeled: Cbx descending colon polyp Received: In formalin Tissue fragment(s): 2 Size: 0.2-0.3 cm Description: Tan fragments Entirely submitted in one cassette.  L. Labeled: Random left colon  cbx Received: in formalin Tissue fragment(s): 2 Size: 0.3-0.5 cm Description: Pink-tan fragments Entirely submitted in one cassette.  M. Labeled: Cbx rectal polyp x3 Received: In formalin Tissue fragment(s): 4 Size: 0.1-0.3 cm Description: Tan fragments Entirely submitted in one cassette.   Final Diagnosis performed by Bryan Lemma, MD. Electronically signed 06/17/2018 11:01:30AM The electronic signature indicates that the  named Attending Pathologist has evaluated the specimen  Technical component performed at [facility], [address] [place], [place] [address] Lab: [phone] Dir: Rush Farmer, MD, MMMProfessional component performed at [facility], [facility], [place], [place], [place] [address] Lab: [phone] Dir: Dellia Nims. Reuel Derby, MD  Op Note - Lollie Sails, MD - 06/14/2018 7:38 AM EDT [facility] Gastroenterology Patient Name: Nikka Hakimian Procedure Date: 06/14/2018 7:38 AM MRN: [id number] Account #: [id number] Date of Birth: [birth year] Admit Type: Outpatient Age: 45 Room: [facility] ENDO ROOM 2 Gender: Female Note Status: Finalized Procedure: Upper GI endoscopy Indications: Epigastric abdominal pain, Generalized abdominal pain,  Abdominal bloating Providers: Lollie Sails, MD Referring MD: No Local Md, MD (Referring MD) Medicines: Monitored Anesthesia Care Complications: No immediate complications. Procedure: Pre-Anesthesia Assessment: - ASA Grade Assessment: II - A patient with mild  systemic disease. After obtaining informed consent, the endoscope was  passed under direct vision. Throughout the procedure,  the patient's blood pressure, pulse, and oxygen  saturations were monitored continuously. The Endoscope  was introduced through the mouth, and advanced to the  third part of duodenum. The upper GI endoscopy was  accomplished without difficulty. The patient tolerated  the procedure well. Findings: A  small hiatal hernia was found. The Z-line was a variable distance from  incisors; the hiatal hernia was sliding. The Z-line was variable. Biopsies were taken with a cold forceps for  histology. The exam of the esophagus was otherwise normal. Many 2 to 9 mm pedunculated and sessile polyps with no bleeding and no  stigmata of recent bleeding were found on the greater curvature of the  gastric body, very prominant in the mid body of the gastric vault. These  all appear grossly to be fundic gland polyps. Biopsies were taken with a  cold forceps for histology. Diffuse minimal inflammation characterized by erythema was found in the  distal gastric body. Biopsies were taken with a cold forceps for  histology. The antrum mucosa showed minimal involvement. Biopsies also  taken from the antrum and placed into a separate jar. The cardia and gastric fundus were normal on retroflexion otherwise. The examined duodenum was normal. Biopsies were taken with a cold  forceps for histology. Impression: - Small hiatal hernia. - Z-line variable. Biopsied. - Many gastric polyps. Biopsied. - Gastritis. Biopsied. - Normal examined duodenum. Biopsied. Recommendation: - Check gastrin today. - Use Protonix (pantoprazole) 40 mg PO daily for 2  weeks. - Return to GI clinic in 2 weeks. - Use sucralfate tablets 1 gram PO QID. Procedure Code(s): --- Professional --- [phone], Esophagogastroduodenoscopy, flexible, transoral;  with biopsy, single or multiple Diagnosis Code(s): --- Professional --- K44.9, Diaphragmatic hernia without obstruction or  gangrene K22.8, Other specified diseases of esophagus K31.7, Polyp of stomach and duodenum K29.70, Gastritis, unspecified, without bleeding R10.13, Epigastric pain R10.84, Generalized abdominal pain R14.0, Abdominal distension (gaseous) CPT copyright 2017 American Medical Association. All rights reserved. The codes documented in this report are preliminary and upon  coder review may  be revised to meet current compliance requirements. Lollie Sails, MD 06/14/2018 8:10:21 AM This report has been signed electronically. Number of Addenda: 0 Note Initiated On: 06/14/2018 7:38 AM [facility]  Electronically signed by Lollie Sails, MD at 06/14/2018 9:47 AM EDT  Back to top of Miscellaneous Notes Op Note - Lollie Sails, MD - 06/14/2018 7:37 AM EDT [facility] Gastroenterology Patient Name: Doneisha Ivey Procedure Date: 06/14/2018 7:37 AM MRN: [id number]  Account #: [id number] Date of Birth: 18-Apr-1946 Admit Type: Outpatient Age: 80 Room: [facility] ENDO ROOM 2 Gender: Female Note Status: Finalized Procedure: Colonoscopy Indications: Rectal bleeding, Change in bowel habits Providers: Lollie Sails, MD Referring MD: No Local Md, MD (Referring MD) Complications: No immediate complications. Procedure: Pre-Anesthesia Assessment: - ASA Grade Assessment: II - A patient with mild  systemic disease. After obtaining informed consent, the colonoscope was  passed under direct vision. Throughout the procedure,  the patient's blood pressure, pulse, and oxygen  saturations were monitored continuously. The was  introduced through the anus and advanced to the the  cecum, identified by appendiceal orifice and ileocecal  valve. The colonoscopy was unusually difficult due to a  tortuous colon. The patient tolerated the procedure  well. The quality of the bowel preparation was fair. Findings: A 3 mm polyp was found in the proximal ascending colon. The polyp was  sessile. The polyp was removed with a cold biopsy forceps. Resection and  retrieval were complete. A 3 mm polyp was found in the distal ascending colon. The polyp was  sessile. The polyp was removed with a cold biopsy forceps. Resection and  retrieval were complete. Biopsies for histology were taken with a cold forceps from the right  colon and left  colon for evaluation of microscopic colitis. Three sessile polyps were found in the transverse colon. The polyps were  2 to 3 mm in size. These polyps were removed with a cold biopsy forceps.  Resection and retrieval were complete. A 50 mm polyp was found in the distal transverse colon. The polyp was  sessile. Biopsies were taken with a cold forceps for histology. Area was  tattooed with an injection of 4 mL of [place] ink. A 3 mm polyp was found in the descending colon. The polyp was sessile.  The polyp was removed with a cold biopsy forceps. Resection and  retrieval were complete. Three sessile polyps were found in the rectum. The polyps were 1 to 2 mm  in size. These polyps were removed with a cold biopsy forceps. Resection  and retrieval were complete. Non-bleeding internal hemorrhoids were found during anoscopy. The  hemorrhoids were small and Grade I (internal hemorrhoids that do not  prolapse). The digital rectal exam was normal otherwise. Impression: - Preparation of the colon was fair. - One 3 mm polyp in the proximal ascending colon,  removed with a cold biopsy forceps. Resected and  retrieved. - One 3 mm polyp in the distal ascending colon, removed  with a cold biopsy forceps. Resected and retrieved. - Three 2 to 3 mm polyps in the transverse colon,  removed with a cold biopsy forceps. Resected and  retrieved. - One 50 mm polyp in the distal transverse colon.  Biopsied. Tattooed. - One 3 mm polyp in the descending colon, removed with  a cold biopsy forceps. Resected and retrieved. - Three 1 to 2 mm polyps in the rectum, removed with a  cold biopsy forceps. Resected and retrieved. - Non-bleeding internal hemorrhoids. - Biopsies were taken with a cold forceps from the  right colon and left colon for evaluation of  microscopic colitis. Recommendation: - Await pathology results.  RADS: n/a  Assessment:      Polyp of transverse colon, unspecified type  [D12.3]  Plan:   1. Polyp of transverse colon, unspecified type [D12.3] Discussed surgical excision.  Alternatives include continued observation, advanced endoscopic excision.  Benefits include possible symptom relief, pathologic evaluation, curative surgery. The risk oflaparoscopic colon resectionsurgery  includes, but not limited to, recurrence, bleeding, chronic pain, post-op infxn, post-op SBO or ileus, hernias, resection of bowel, re-anastamosis, possible ostomy placement and need for re-operation to address said risks. The risks of general anesthetic, if used, includes MI, CVA, sudden death or even reaction to anesthetic medications also discussed. Alternatives include continued observation.Benefits include possible symptom relief, preventing further decline in health and possible death.  Typical post-op recovery time of additional days in hospital for observation afterwards also discussed.  The patientverbalized understanding and all questions were answered to the patient's satisfaction.  Will proceed with surger

## 2018-08-05 NOTE — H&P (Signed)
Subjective:   CC: Polyp of transverse colon, unspecified type [D12.3]  HPI:  Cassie Carlson is a 72 y.o. female returns to discuss r/b/a of upcoming laparoscopic colon resection for Unresectable transverse colon polyp.  She has an appointment with a geneticist for possible polyposis syndrome as well.  No new issues or complaints.   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  Past Surgical History:  has a past surgical history that includes Cholecystectomy; Hysterectomy (1992); Arthroscopic Rotator Cuff Repair (Left); Combined augmentation mammaplasty and abdominoplasty (1991); other surgery (Bilateral, 1991); other surgery; left rotator cuff repair (1990); Functional endoscopic sinus surgery (1991); Face Lift (01/2002); Upper gastrointestinal endoscopy (07/12/12); Colonoscopy (02/13/10); Colonoscopy (03/13/14); egd (03/13/14); Tonsillectomy (1953); Anterior fusion cervical spine (2016); Colonoscopy (06/14/2018); and egd (06/14/2018).  Family History: family history includes Brain cancer in her brother; Breast cancer (age of onset: 62) in her mother; Cancer in her father; Colon cancer (age of onset: 44) in her father; Diabetes type II in her paternal grandfather; Lung cancer in her brother, mother, and sister; Pancreatitis in her daughter; Prostate cancer in her father; Throat cancer in her sister; Ulcers in her father.  Social History:  reports that she quit smoking about 53 years ago. Her smoking use included cigarettes. She started smoking about 57 years ago. She quit after 4.00  years of use. She has never used smokeless tobacco. She reports that she does not drink alcohol or use drugs.  Current Medications: has a current medication list which includes the following prescription(s): acetaminophen, fluticasone propionate, levothyroxine, ranitidine, sucralfate, tramadol, umeclidinium, venlafaxine, metoclopramide, metronidazole, and neomycin.  Allergies:       Allergies  Allergen Reactions  . Codeine Other (See Comments)    Flushing of the face.  . Aspirin Diarrhea and Nausea  . Motrin [Ibuprofen] Diarrhea and Nausea    ROS:  A 15 point review of systems was performed and pertinent positives and negatives noted in HPI   Objective:   BP 123/81   Pulse 76   Temp 36 C (96.8 F) (Oral)   Ht 161.3 cm (5' 3.5")   Wt 84.8 kg (186 lb 15.2 oz)   BMI 32.60 kg/m   Constitutional :  alert, appears stated age, cooperative and no distress  Lymphatics/Throat:  no asymmetry, masses, or scars  Respiratory:  clear to auscultation bilaterally  Cardiovascular:  regular rate and rhythm  Gastrointestinal: soft, non-tender; bowel sounds normal; no masses,  no organomegaly.    Musculoskeletal: Steady gait and movement  Skin: Cool and moist  Psychiatric: Normal affect, non-agitated, not confused       LABS:  SPECIMEN SUBMITTED: A. Duodenum; cbxs B. Stomach,antrum; cbxs C. Stomach,body; cbxs D. Stomach polyps, multiple, upper body; cbxs E. GEJ; cbxs F. Colon polyp,proximal ascending; cbxs G. Colon polyp, distal ascending; cbx H. Colon, random right; cbxs I. Colon polyp x3, transverse; cbxs J. Colon polyp, distal transverse; cbxs K. Colon polyp, descending; cbx L. Colon,random left; cbxs M. Rectum polyp x3; cbx  CLINICAL HISTORY: None provided  PRE-OPERATIVE DIAGNOSIS: GERD, Epigastric pain, nausea, rectal bleeding, HX colon polyps  POST-OPERATIVE DIAGNOSIS: Fundic Gland polyps, hiatal hernia, diverticulosis, rectal  polyps     DIAGNOSIS: A.DUODENUM; COLD BIOPSY: -  DUODENAL MUCOSA WITH INTACT VILLI. - NEGATIVE FOR ACTIVE INFLAMMATION, INTRAEPITHELIAL LYMPHOCYTOSIS, AND INFECTIOUS AGENTS.  B.STOMACH, ANTRUM; COLD BIOPSY: - ANTRAL MUCOSA WITH CONGESTION. - NEGATIVE FOR H. PYLORI, INTESTINAL METAPLASIA, DYSPLASIA, AND MALIGNANCY.  C.STOMACH, BODY; COLD BIOPSY: - OXYNTIC MUCOSA WITH PROTON PUMP INHIBITOR EFFECT AND CONGESTION. - NEGATIVE FOR H. PYLORI, INTESTINAL METAPLASIA, DYSPLASIA, AND MALIGNANCY.  D.STOMACH POLYPS, MULTIPLE, UPPER BODY; COLD BIOPSY: - FUNDIC GLAND POLYPS, 5 FRAGMENTS. - NEGATIVE FOR DYSPLASIA AND MALIGNANCY.  E.GASTROESOPHAGEAL JUNCTION; COLD BIOPSY: - SQUAMOCOLUMNAR MUCOSA WITH MILD CHRONIC INFLAMMATION. - NEGATIVE FOR GOBLET CELLS, DYSPLASIA, AND MALIGNANCY.  F.COLON POLYP, PROXIMAL ASCENDING; COLD BIOPSY: - TUBULAR ADENOMA, NEGATIVE FOR HIGH-GRADE DYSPLASIA AND MALIGNANCY. - SESSILE SERRATED ADENOMA, NEGATIVE FOR CYTOLOGIC DYSPLASIA AND MALIGNANCY. - TWO DIFFERENT PIECES FOUND IN CONTAINER.  G.COLON POLYP, DISTAL ASCENDING; COLD BIOPSY: - TUBULAR ADENOMA, MULTIPLE FRAGMENTS. - NEGATIVE FOR HIGH-GRADE DYSPLASIA AND MALIGNANCY.  H.RIGHT COLON; RANDOM COLD BIOPSY: - NEGATIVE FOR MICROSCOPIC COLITIS AND DYSPLASIA.  I.COLON POLYP X 3, TRANSVERSE; COLD BIOPSY: - TUBULAR ADENOMAS, 5 FRAGMENTS. - NEGATIVE FOR HIGH-GRADE DYSPLASIA AND MALIGNANCY.  J.COLON POLYP, DISTAL TRANSVERSE; COLD BIOPSY: - TUBULAR ADENOMA, 4 FRAGMENTS. - NEGATIVE FOR HIGH-GRADE DYSPLASIA AND MALIGNANCY.  K.COLON POLYP, ASCENDING; COLD BIOPSY: - TUBULAR ADENOMA. - NEGATIVE FOR HIGH-GRADE DYSPLASIA AND MALIGNANCY.  L.LEFT COLON; RANDOM COLD BIOPSY: - NEGATIVE FOR MICROSCOPIC COLITIS AND DYSPLASIA.  M.RECTUM POLYP X 3; COLD BIOPSY: - HYPERPLASTIC POLYPS, 3 FRAGMENTS. - NEGATIVE FOR DYSPLASIA AND MALIGNANCY.   GROSS DESCRIPTION: A. Labeled: Cbx  duodenum Received: In formalin Tissue fragment(s): 2 Size: 0.3 and 0.5 cm Description: Tan fragments Entirely submitted in one cassette.  B. Labeled: Cbx antrum of stomach Received: In formalin Tissue fragment(s): 1 Size: 0.4 cm Description: Pink-tan fragment Entirely submitted in one cassette.  C. Labeled: Cbx body of stomach Received: In formalin Tissue fragment(s): 2 Size: 0.4-0.5 cm Description: Pink-tan fragments Entirely submitted in one cassette.  D. Labeled: Cbx upper gastric body multiple polyps Received: In formalin Tissue fragment(s): 5 Size: 0.2-0.4 cm Description: Tan fragments Entirely submitted in one cassette.  E. Labeled: Cbx GEJ Received: in formalin Tissue fragment(s): 2 Size: 0.3 and 0.4 cm Description: Pink-tan fragments Entirely submitted in one cassette.  F. Labeled: Cbx proximal ascending colon polyp Received: In formalin Tissue fragment(s): 2 Size: 0.3 and 0.4 cm Description: Tan fragments Entirely submitted in one cassette.  G. Labeled: Cbx distal ascending colon polyp Received: In formalin Tissue fragment(s): Multiple Size: Aggregate, 1.4 x 0.4 x 0.1 cm Description: tan-brown fragments Entirely submitted in one cassette.  H. Labeled: Right colon random cbx Received: In formalin Tissue fragment(s): 3 Size: 0.2-0.4 cm Description: Tan fragments Entirely submitted in one cassette.  I. Labeled: Cbx transverse colon polyp x3 Received: In formalin Tissue fragment(s): 5 Size: 0.2-0.4 cm Description: Tan fragments Entirely submitted in one cassette.  J. Labeled: Cbx transverse distal colon polyp Received: In formalin Tissue fragment(s): 4 Size: 0.2-0.4 cm Description: Pink-tan fragments Entirely submitted in one cassette.  K. Labeled: Cbx descending colon polyp Received: In formalin Tissue fragment(s): 2 Size: 0.2-0.3 cm Description: Tan fragments Entirely submitted in one cassette.  L. Labeled: Random left colon  cbx Received: in formalin Tissue fragment(s): 2 Size: 0.3-0.5 cm Description: Pink-tan fragments Entirely submitted in one cassette.  M. Labeled: Cbx rectal polyp x3 Received: In formalin Tissue fragment(s): 4 Size: 0.1-0.3 cm Description: Tan fragments Entirely submitted in one cassette.   Final Diagnosis performed by Bryan Lemma, MD. Electronically signed 06/17/2018 11:01:30AM The electronic signature indicates that the  named Attending Pathologist has evaluated the specimen  Technical component performed at [facility], [address] [place], [place] [address] Lab: [phone] Dir: Rush Farmer, MD, MMMProfessional component performed at [facility], [facility], [place], [place], [place] [address] Lab: [phone] Dir: Dellia Nims. Reuel Derby, MD  Op Note - Lollie Sails, MD - 06/14/2018 7:38 AM EDT [facility] Gastroenterology Patient Name: Takara Sermons Procedure Date: 06/14/2018 7:38 AM MRN: [id number] Account #: [id number] Date of Birth: 1946/05/25 Admit Type: Outpatient Age: 89 Room: [facility] ENDO ROOM 2 Gender: Female Note Status: Finalized Procedure: Upper GI endoscopy Indications: Epigastric abdominal pain, Generalized abdominal pain,  Abdominal bloating Providers: Lollie Sails, MD Referring MD: No Local Md, MD (Referring MD) Medicines: Monitored Anesthesia Care Complications: No immediate complications. Procedure: Pre-Anesthesia Assessment: - ASA Grade Assessment: II - A patient with mild  systemic disease. After obtaining informed consent, the endoscope was  passed under direct vision. Throughout the procedure,  the patient's blood pressure, pulse, and oxygen  saturations were monitored continuously. The Endoscope  was introduced through the mouth, and advanced to the  third part of duodenum. The upper GI endoscopy was  accomplished without difficulty. The patient tolerated  the procedure well. Findings: A  small hiatal hernia was found. The Z-line was a variable distance from  incisors; the hiatal hernia was sliding. The Z-line was variable. Biopsies were taken with a cold forceps for  histology. The exam of the esophagus was otherwise normal. Many 2 to 9 mm pedunculated and sessile polyps with no bleeding and no  stigmata of recent bleeding were found on the greater curvature of the  gastric body, very prominant in the mid body of the gastric vault. These  all appear grossly to be fundic gland polyps. Biopsies were taken with a  cold forceps for histology. Diffuse minimal inflammation characterized by erythema was found in the  distal gastric body. Biopsies were taken with a cold forceps for  histology. The antrum mucosa showed minimal involvement. Biopsies also  taken from the antrum and placed into a separate jar. The cardia and gastric fundus were normal on retroflexion otherwise. The examined duodenum was normal. Biopsies were taken with a cold  forceps for histology. Impression: - Small hiatal hernia. - Z-line variable. Biopsied. - Many gastric polyps. Biopsied. - Gastritis. Biopsied. - Normal examined duodenum. Biopsied. Recommendation: - Check gastrin today. - Use Protonix (pantoprazole) 40 mg PO daily for 2  weeks. - Return to GI clinic in 2 weeks. - Use sucralfate tablets 1 gram PO QID. Procedure Code(s): --- Professional --- [phone], Esophagogastroduodenoscopy, flexible, transoral;  with biopsy, single or multiple Diagnosis Code(s): --- Professional --- K44.9, Diaphragmatic hernia without obstruction or  gangrene K22.8, Other specified diseases of esophagus K31.7, Polyp of stomach and duodenum K29.70, Gastritis, unspecified, without bleeding R10.13, Epigastric pain R10.84, Generalized abdominal pain R14.0, Abdominal distension (gaseous) CPT copyright 2017 American Medical Association. All rights reserved. The codes documented in this report are preliminary and upon  coder review may  be revised to meet current compliance requirements. Lollie Sails, MD 06/14/2018 8:10:21 AM This report has been signed electronically. Number of Addenda: 0 Note Initiated On: 06/14/2018 7:38 AM [facility]  Electronically signed by Lollie Sails, MD at 06/14/2018 9:47 AM EDT  Back to top of Miscellaneous Notes Op Note - Lollie Sails, MD - 06/14/2018 7:37 AM EDT [facility] Gastroenterology Patient Name: Desare Duddy Procedure Date: 06/14/2018 7:37 AM MRN: [id number]  Account #: [id number] Date of Birth: [birth year] Admit Type: Outpatient Age: 86 Room: [facility] ENDO ROOM 2 Gender: Female Note Status: Finalized Procedure: Colonoscopy Indications: Rectal bleeding, Change in bowel habits Providers: Lollie Sails, MD Referring MD: No Local Md, MD (Referring MD) Complications: No immediate complications. Procedure: Pre-Anesthesia Assessment: - ASA Grade Assessment: II - A patient with mild  systemic disease. After obtaining informed consent, the colonoscope was  passed under direct vision. Throughout the procedure,  the patient's blood pressure, pulse, and oxygen  saturations were monitored continuously. The was  introduced through the anus and advanced to the the  cecum, identified by appendiceal orifice and ileocecal  valve. The colonoscopy was unusually difficult due to a  tortuous colon. The patient tolerated the procedure  well. The quality of the bowel preparation was fair. Findings: A 3 mm polyp was found in the proximal ascending colon. The polyp was  sessile. The polyp was removed with a cold biopsy forceps. Resection and  retrieval were complete. A 3 mm polyp was found in the distal ascending colon. The polyp was  sessile. The polyp was removed with a cold biopsy forceps. Resection and  retrieval were complete. Biopsies for histology were taken with a cold forceps from the right  colon and left  colon for evaluation of microscopic colitis. Three sessile polyps were found in the transverse colon. The polyps were  2 to 3 mm in size. These polyps were removed with a cold biopsy forceps.  Resection and retrieval were complete. A 50 mm polyp was found in the distal transverse colon. The polyp was  sessile. Biopsies were taken with a cold forceps for histology. Area was  tattooed with an injection of 4 mL of [place] ink. A 3 mm polyp was found in the descending colon. The polyp was sessile.  The polyp was removed with a cold biopsy forceps. Resection and  retrieval were complete. Three sessile polyps were found in the rectum. The polyps were 1 to 2 mm  in size. These polyps were removed with a cold biopsy forceps. Resection  and retrieval were complete. Non-bleeding internal hemorrhoids were found during anoscopy. The  hemorrhoids were small and Grade I (internal hemorrhoids that do not  prolapse). The digital rectal exam was normal otherwise. Impression: - Preparation of the colon was fair. - One 3 mm polyp in the proximal ascending colon,  removed with a cold biopsy forceps. Resected and  retrieved. - One 3 mm polyp in the distal ascending colon, removed  with a cold biopsy forceps. Resected and retrieved. - Three 2 to 3 mm polyps in the transverse colon,  removed with a cold biopsy forceps. Resected and  retrieved. - One 50 mm polyp in the distal transverse colon.  Biopsied. Tattooed. - One 3 mm polyp in the descending colon, removed with  a cold biopsy forceps. Resected and retrieved. - Three 1 to 2 mm polyps in the rectum, removed with a  cold biopsy forceps. Resected and retrieved. - Non-bleeding internal hemorrhoids. - Biopsies were taken with a cold forceps from the  right colon and left colon for evaluation of  microscopic colitis. Recommendation: - Await pathology results.  RADS: n/a  Assessment:      Polyp of transverse colon, unspecified type  [D12.3]  Plan:   1. Polyp of transverse colon, unspecified type [D12.3] Discussed surgical excision.  Alternatives include continued observation, advanced endoscopic excision.  Benefits include possible symptom relief, pathologic evaluation, curative surgery. The risk oflaparoscopic colon resectionsurgery  includes, but not limited to, recurrence, bleeding, chronic pain, post-op infxn, post-op SBO or ileus, hernias, resection of bowel, re-anastamosis, possible ostomy placement and need for re-operation to address said risks. The risks of general anesthetic, if used, includes MI, CVA, sudden death or even reaction to anesthetic medications also discussed. Alternatives include continued observation.Benefits include possible symptom relief, preventing further decline in health and possible death.  Typical post-op recovery time of additional days in hospital for observation afterwards also discussed.  The patientverbalized understanding and all questions were answered to the patient's satisfaction.  Will proceed with surger

## 2018-08-10 ENCOUNTER — Inpatient Hospital Stay: Admission: RE | Admit: 2018-08-10 | Payer: PRIVATE HEALTH INSURANCE | Source: Ambulatory Visit

## 2018-08-16 ENCOUNTER — Other Ambulatory Visit: Payer: Self-pay

## 2018-08-16 ENCOUNTER — Encounter
Admission: RE | Admit: 2018-08-16 | Discharge: 2018-08-16 | Disposition: A | Discharge status: Home / Self Care | Payer: Medicare Other | Source: Ambulatory Visit | Attending: Surgery | Admitting: Surgery

## 2018-08-16 DIAGNOSIS — Z01818 Encounter for other preprocedural examination: Secondary | ICD-10-CM | POA: Insufficient documentation

## 2018-08-16 DIAGNOSIS — Z01810 Encounter for preprocedural cardiovascular examination: Secondary | ICD-10-CM

## 2018-08-16 HISTORY — DX: Anxiety disorder, unspecified: F41.9

## 2018-08-16 HISTORY — DX: Sleep apnea, unspecified: G47.30

## 2018-08-16 HISTORY — DX: Family history of other specified conditions: Z84.89

## 2018-08-16 LAB — CBC
HCT: 38.4 % (ref 36.0–46.0)
Hemoglobin: 12.3 g/dL (ref 12.0–15.0)
MCH: 32 pg (ref 26.0–34.0)
MCHC: 32 g/dL (ref 30.0–36.0)
MCV: 100 fL (ref 80.0–100.0)
NRBC: 0 % (ref 0.0–0.2)
PLATELETS: 230 10*3/uL (ref 150–400)
RBC: 3.84 MIL/uL — ABNORMAL LOW (ref 3.87–5.11)
RDW: 11.9 % (ref 11.5–15.5)
WBC: 3.8 10*3/uL — ABNORMAL LOW (ref 4.0–10.5)

## 2018-08-16 LAB — TYPE AND SCREEN
ABO/RH(D): O POS
Antibody Screen: NEGATIVE

## 2018-08-16 NOTE — Pre-Procedure Instructions (Signed)
Patient states she has had difficulty waking up after surgery and an instance where her heart was "racing" during a surgical procedure in which the procedure was halted to give medications to bring heart rate back to normal then proceeded with the procedure. Her sister had a lung biopsy in which her heart rate became elevated and family was told "they almost lost her

## 2018-08-16 NOTE — Patient Instructions (Signed)
Your procedure is scheduled on: Monday 08/22/18 Report to [facility]. To find out your arrival time please call [phone] between 1PM - 3PM on Friday 08/19/18.  Remember: Instructions that are not followed completely may result in serious medical risk, up to and including death, or upon the discretion of your surgeon and anesthesiologist your surgery may need to be rescheduled.     _X__ 1. Do not eat food after midnight the night before your procedure.                 No gum chewing or hard candies. You may drink clear liquids up to 2 hours                 before you are scheduled to arrive for your surgery- DO not drink clear                 liquids within 2 hours of the start of your surgery.                 Clear Liquids include:  water, apple juice without pulp, clear carbohydrate                 drink such as Clearfast or Gatorade, Black Coffee or Tea (Do not add                 anything to coffee or tea).  __X__2.  On the morning of surgery brush your teeth with toothpaste and water, you                 may rinse your mouth with mouthwash if you wish.  Do not swallow any              toothpaste of mouthwash.     _X__ 3.  No Alcohol for 24 hours before or after surgery.   _X__ 4.  Do Not Smoke or use e-cigarettes For 24 Hours Prior to Your Surgery.                 Do not use any chewable tobacco products for at least 6 hours prior to                 surgery.  ____  5.  Bring all medications with you on the day of surgery if instructed.   __X__  6.  Notify your doctor if there is any change in your medical condition      (cold, fever, infections).     Do not wear jewelry, make-up, hairpins, clips or nail polish. Do not wear lotions, powders, or perfumes.  Do not shave 48 hours prior to surgery. Men may shave face and neck. Do not bring valuables to the hospital.    [facility] is not responsible for any belongings or  valuables.  Contacts, dentures/partials or body piercings may not be worn into surgery. Bring a case for your contacts, glasses or hearing aids, a denture cup will be supplied. Leave your suitcase in the car. After surgery it may be brought to your room. For patients admitted to the hospital, discharge time is determined by your treatment team.   Patients discharged the day of surgery will not be allowed to drive home.   Please read over the following fact sheets that you were given:   MRSA Information  __X__ Take these medicines the morning of surgery with A SIP OF WATER:  1. levothyroxine (SYNTHROID, LEVOTHROID)  2. RABEprazole (ACIPHEX)  3. ranitidine (ZANTAC)   4. venlafaxine XR (EFFEXOR-XR)   5.  6.  ____ Fleet Enema (as directed)   __X__ Use CHG Soap/SAGE wipes as directed  ____ Use inhalers on the day of surgery  ____ Stop metformin/Janumet/Farxiga 2 days prior to surgery    ____ Take 1/2 of usual insulin dose the night before surgery. No insulin the morning          of surgery.   ____ Stop Blood Thinners Coumadin/Plavix/Xarelto/Pleta/Pradaxa/Eliquis/Effient/Aspirin  on   Or contact your Surgeon, Cardiologist or Medical Doctor regarding  ability to stop your blood thinners  __X__ Stop Anti-inflammatories 7 days before surgery such as Advil, Ibuprofen, Motrin,  BC or Goodies Powder, Naprosyn, Naproxen, Aleve, Aspirin    __X__ Stop all herbal supplements, fish oil or vitamin E until after surgery.    ____ Bring C-Pap to the hospital.

## 2018-08-16 NOTE — Anesthesia Pain Management Evaluation Note (Deleted)
Patient states she has had difficulty waking up after surgery and an instance where her heart was "racing" during a surgical procedure in which the procedure was halted to give medications to bring heart rate back to normal then proceeded with the procedure. Her sister had a lung biopsy in which her heart rate became elevated and family was told "they almost lost her".

## 2018-08-21 MED ORDER — SODIUM CHLORIDE 0.9 % IV SOLN
2.0000 g | INTRAVENOUS | Status: AC
  Administered 2018-08-22: 2 g via INTRAVENOUS
  Filled 2018-08-21: qty 2

## 2018-08-22 ENCOUNTER — Encounter
Admission: RE | Disposition: A | Discharge status: Home Health | Payer: Self-pay | Source: Home / Self Care | Attending: Surgery

## 2018-08-22 ENCOUNTER — Inpatient Hospital Stay: Payer: Medicare Other | Admitting: Anesthesiology

## 2018-08-22 ENCOUNTER — Encounter: Payer: Self-pay | Admitting: *Deleted

## 2018-08-22 ENCOUNTER — Other Ambulatory Visit: Payer: Self-pay

## 2018-08-22 ENCOUNTER — Inpatient Hospital Stay
Admission: RE | Admit: 2018-08-22 | Discharge: 2018-08-29 | DRG: 330 | Disposition: A | Discharge status: Home Health | Payer: Medicare Other | Attending: Surgery | Admitting: Surgery

## 2018-08-22 DIAGNOSIS — F419 Anxiety disorder, unspecified: Secondary | ICD-10-CM | POA: Diagnosis present

## 2018-08-22 DIAGNOSIS — G4730 Sleep apnea, unspecified: Secondary | ICD-10-CM | POA: Diagnosis present

## 2018-08-22 DIAGNOSIS — K567 Ileus, unspecified: Secondary | ICD-10-CM | POA: Diagnosis not present

## 2018-08-22 DIAGNOSIS — Z801 Family history of malignant neoplasm of trachea, bronchus and lung: Secondary | ICD-10-CM

## 2018-08-22 DIAGNOSIS — Z833 Family history of diabetes mellitus: Secondary | ICD-10-CM

## 2018-08-22 DIAGNOSIS — K219 Gastro-esophageal reflux disease without esophagitis: Secondary | ICD-10-CM | POA: Diagnosis present

## 2018-08-22 DIAGNOSIS — Z5331 Laparoscopic surgical procedure converted to open procedure: Secondary | ICD-10-CM

## 2018-08-22 DIAGNOSIS — Z803 Family history of malignant neoplasm of breast: Secondary | ICD-10-CM

## 2018-08-22 DIAGNOSIS — K9189 Other postprocedural complications and disorders of digestive system: Secondary | ICD-10-CM

## 2018-08-22 DIAGNOSIS — Y92230 Patient room in hospital as the place of occurrence of the external cause: Secondary | ICD-10-CM | POA: Diagnosis not present

## 2018-08-22 DIAGNOSIS — T426X5A Adverse effect of other antiepileptic and sedative-hypnotic drugs, initial encounter: Secondary | ICD-10-CM | POA: Diagnosis not present

## 2018-08-22 DIAGNOSIS — D123 Benign neoplasm of transverse colon: Secondary | ICD-10-CM | POA: Diagnosis present

## 2018-08-22 DIAGNOSIS — Z79890 Hormone replacement therapy (postmenopausal): Secondary | ICD-10-CM

## 2018-08-22 DIAGNOSIS — Z79899 Other long term (current) drug therapy: Secondary | ICD-10-CM | POA: Diagnosis not present

## 2018-08-22 DIAGNOSIS — K635 Polyp of colon: Secondary | ICD-10-CM

## 2018-08-22 DIAGNOSIS — Z87891 Personal history of nicotine dependence: Secondary | ICD-10-CM

## 2018-08-22 DIAGNOSIS — K660 Peritoneal adhesions (postprocedural) (postinfection): Secondary | ICD-10-CM | POA: Diagnosis present

## 2018-08-22 DIAGNOSIS — R410 Disorientation, unspecified: Secondary | ICD-10-CM | POA: Diagnosis not present

## 2018-08-22 DIAGNOSIS — F329 Major depressive disorder, single episode, unspecified: Secondary | ICD-10-CM | POA: Diagnosis present

## 2018-08-22 DIAGNOSIS — E039 Hypothyroidism, unspecified: Secondary | ICD-10-CM | POA: Diagnosis present

## 2018-08-22 DIAGNOSIS — D126 Benign neoplasm of colon, unspecified: Secondary | ICD-10-CM | POA: Diagnosis present

## 2018-08-22 DIAGNOSIS — Z4659 Encounter for fitting and adjustment of other gastrointestinal appliance and device: Secondary | ICD-10-CM

## 2018-08-22 DIAGNOSIS — R140 Abdominal distension (gaseous): Secondary | ICD-10-CM

## 2018-08-22 DIAGNOSIS — Z8042 Family history of malignant neoplasm of prostate: Secondary | ICD-10-CM | POA: Diagnosis not present

## 2018-08-22 DIAGNOSIS — T402X5A Adverse effect of other opioids, initial encounter: Secondary | ICD-10-CM | POA: Diagnosis not present

## 2018-08-22 DIAGNOSIS — Z800 Family history of malignant neoplasm of digestive organs: Secondary | ICD-10-CM | POA: Diagnosis not present

## 2018-08-22 DIAGNOSIS — S36030A Superficial (capsular) laceration of spleen, initial encounter: Secondary | ICD-10-CM | POA: Diagnosis present

## 2018-08-22 DIAGNOSIS — R443 Hallucinations, unspecified: Secondary | ICD-10-CM | POA: Diagnosis present

## 2018-08-22 DIAGNOSIS — C189 Malignant neoplasm of colon, unspecified: Secondary | ICD-10-CM | POA: Diagnosis present

## 2018-08-22 HISTORY — PX: COLON RESECTION: SHX5231

## 2018-08-22 LAB — CBC
HCT: 32.7 % — ABNORMAL LOW (ref 36.0–46.0)
HEMOGLOBIN: 11.1 g/dL — AB (ref 12.0–15.0)
MCH: 32.7 pg (ref 26.0–34.0)
MCHC: 33.9 g/dL (ref 30.0–36.0)
MCV: 96.5 fL (ref 80.0–100.0)
PLATELETS: 210 10*3/uL (ref 150–400)
RBC: 3.39 MIL/uL — AB (ref 3.87–5.11)
RDW: 12.1 % (ref 11.5–15.5)
WBC: 9 10*3/uL (ref 4.0–10.5)
nRBC: 0 % (ref 0.0–0.2)

## 2018-08-22 LAB — ABO/RH: ABO/RH(D): O POS

## 2018-08-22 LAB — CREATININE, SERUM
CREATININE: 0.9 mg/dL (ref 0.44–1.00)
GFR calc non Af Amer: >60 mL/min (ref >60)

## 2018-08-22 SURGERY — COLON RESECTION LAPAROSCOPIC
Anesthesia: General | Site: Abdomen

## 2018-08-22 MED ORDER — CHLORHEXIDINE GLUCONATE CLOTH 2 % EX PADS
6.0000 | MEDICATED_PAD | Freq: Once | CUTANEOUS | Status: AC
  Administered 2018-08-22: 6 via TOPICAL

## 2018-08-22 MED ORDER — TRAMADOL HCL 50 MG PO TABS
50.0000 mg | ORAL_TABLET | Freq: Four times a day (QID) | ORAL | Status: DC | PRN
  Administered 2018-08-23 – 2018-08-25 (×2): 50 mg via ORAL
  Filled 2018-08-22 (×2): qty 1

## 2018-08-22 MED ORDER — SEVOFLURANE IN SOLN
RESPIRATORY_TRACT | Status: AC
  Filled 2018-08-22: qty 250

## 2018-08-22 MED ORDER — VENLAFAXINE HCL ER 75 MG PO CP24
150.0000 mg | ORAL_CAPSULE | Freq: Every day | ORAL | Status: DC
  Administered 2018-08-23 – 2018-08-29 (×7): 150 mg via ORAL
  Filled 2018-08-22 (×7): qty 2

## 2018-08-22 MED ORDER — ACETAMINOPHEN 325 MG PO TABS: 650.0000 mg | ORAL_TABLET | Freq: Four times a day (QID) | ORAL | Status: DC | PRN

## 2018-08-22 MED ORDER — LACTATED RINGERS IV SOLN
INTRAVENOUS | Status: DC | PRN
  Administered 2018-08-22 (×2): via INTRAVENOUS

## 2018-08-22 MED ORDER — ACETAMINOPHEN 500 MG PO TABS
1000.0000 mg | ORAL_TABLET | ORAL | Status: AC
  Administered 2018-08-22: 1000 mg via ORAL

## 2018-08-22 MED ORDER — FENTANYL CITRATE (PF) 100 MCG/2ML IJ SOLN: 25.0000 ug | INTRAMUSCULAR | Status: DC | PRN

## 2018-08-22 MED ORDER — FAMOTIDINE 20 MG PO TABS
20.0000 mg | ORAL_TABLET | Freq: Two times a day (BID) | ORAL | Status: DC
  Administered 2018-08-22 – 2018-08-25 (×6): 20 mg via ORAL
  Filled 2018-08-22 (×6): qty 1

## 2018-08-22 MED ORDER — ONDANSETRON HCL 4 MG/2ML IJ SOLN
INTRAMUSCULAR | Status: AC
  Filled 2018-08-22: qty 2

## 2018-08-22 MED ORDER — ACETAMINOPHEN 500 MG PO TABS
ORAL_TABLET | ORAL | Status: AC
  Filled 2018-08-22: qty 2

## 2018-08-22 MED ORDER — DEXAMETHASONE SODIUM PHOSPHATE 10 MG/ML IJ SOLN
INTRAMUSCULAR | Status: DC | PRN
  Administered 2018-08-22: 5 mg via INTRAVENOUS

## 2018-08-22 MED ORDER — PROPOFOL 10 MG/ML IV BOLUS
INTRAVENOUS | Status: DC | PRN
  Administered 2018-08-22: 160 mg via INTRAVENOUS

## 2018-08-22 MED ORDER — BUPIVACAINE LIPOSOME 1.3 % IJ SUSP
INTRAMUSCULAR | Status: AC
  Filled 2018-08-22: qty 20

## 2018-08-22 MED ORDER — MORPHINE SULFATE (PF) 2 MG/ML IV SOLN
2.0000 mg | INTRAVENOUS | Status: DC | PRN
  Administered 2018-08-22 – 2018-08-23 (×4): 2 mg via INTRAVENOUS
  Filled 2018-08-22 (×4): qty 1

## 2018-08-22 MED ORDER — SUCRALFATE 1 G PO TABS
1.0000 g | ORAL_TABLET | Freq: Two times a day (BID) | ORAL | Status: DC
  Administered 2018-08-22 – 2018-08-28 (×10): 1 g via ORAL
  Filled 2018-08-22 (×11): qty 1

## 2018-08-22 MED ORDER — BUPIVACAINE-EPINEPHRINE (PF) 0.5% -1:200000 IJ SOLN
INTRAMUSCULAR | Status: AC
  Filled 2018-08-22: qty 30

## 2018-08-22 MED ORDER — ONDANSETRON HCL 4 MG/2ML IJ SOLN
4.0000 mg | Freq: Four times a day (QID) | INTRAMUSCULAR | Status: DC | PRN
  Administered 2018-08-25 (×2): 4 mg via INTRAVENOUS
  Filled 2018-08-22 (×2): qty 2

## 2018-08-22 MED ORDER — ROCURONIUM BROMIDE 50 MG/5ML IV SOLN
INTRAVENOUS | Status: AC
  Filled 2018-08-22: qty 1

## 2018-08-22 MED ORDER — HYDROMORPHONE HCL 1 MG/ML IJ SOLN: 0.2500 mg | INTRAMUSCULAR | Status: DC | PRN

## 2018-08-22 MED ORDER — DEXMEDETOMIDINE HCL IN NACL 200 MCG/50ML IV SOLN
INTRAVENOUS | Status: DC | PRN
  Administered 2018-08-22: 8 ug via INTRAVENOUS

## 2018-08-22 MED ORDER — LACTATED RINGERS IV SOLN
INTRAVENOUS | Status: DC
  Administered 2018-08-22: 07:00:00 via INTRAVENOUS

## 2018-08-22 MED ORDER — EPHEDRINE SULFATE 50 MG/ML IJ SOLN
INTRAMUSCULAR | Status: DC | PRN
  Administered 2018-08-22 (×2): 15 mg via INTRAVENOUS

## 2018-08-22 MED ORDER — BUPIVACAINE-EPINEPHRINE (PF) 0.5% -1:200000 IJ SOLN
INTRAMUSCULAR | Status: DC | PRN
  Administered 2018-08-22: 20 mL via PERINEURAL

## 2018-08-22 MED ORDER — FAMOTIDINE 20 MG PO TABS
20.0000 mg | ORAL_TABLET | Freq: Once | ORAL | Status: AC
  Administered 2018-08-22: 20 mg via ORAL

## 2018-08-22 MED ORDER — GABAPENTIN 300 MG PO CAPS
300.0000 mg | ORAL_CAPSULE | ORAL | Status: AC
  Administered 2018-08-22: 300 mg via ORAL

## 2018-08-22 MED ORDER — SODIUM CHLORIDE 0.9 % IV SOLN
INTRAVENOUS | Status: DC | PRN
  Administered 2018-08-22: 40 mL

## 2018-08-22 MED ORDER — ROCURONIUM BROMIDE 100 MG/10ML IV SOLN
INTRAVENOUS | Status: DC | PRN
  Administered 2018-08-22: 20 mg via INTRAVENOUS
  Administered 2018-08-22: 50 mg via INTRAVENOUS
  Administered 2018-08-22 (×2): 20 mg via INTRAVENOUS

## 2018-08-22 MED ORDER — PHENYLEPHRINE HCL 10 MG/ML IJ SOLN
INTRAMUSCULAR | Status: DC | PRN
  Administered 2018-08-22 (×8): 100 ug via INTRAVENOUS

## 2018-08-22 MED ORDER — MIDAZOLAM HCL 2 MG/2ML IJ SOLN
INTRAMUSCULAR | Status: DC | PRN
  Administered 2018-08-22: 2 mg via INTRAVENOUS

## 2018-08-22 MED ORDER — SUGAMMADEX SODIUM 200 MG/2ML IV SOLN
INTRAVENOUS | Status: DC | PRN
  Administered 2018-08-22: 180 mg via INTRAVENOUS

## 2018-08-22 MED ORDER — OXYCODONE HCL 5 MG/5ML PO SOLN: 5.0000 mg | Freq: Once | ORAL | Status: DC | PRN

## 2018-08-22 MED ORDER — FENTANYL CITRATE (PF) 100 MCG/2ML IJ SOLN
INTRAMUSCULAR | Status: DC | PRN
  Administered 2018-08-22 (×2): 100 ug via INTRAVENOUS
  Administered 2018-08-22: 50 ug via INTRAVENOUS

## 2018-08-22 MED ORDER — GABAPENTIN 300 MG PO CAPS
ORAL_CAPSULE | ORAL | Status: AC
  Filled 2018-08-22: qty 1

## 2018-08-22 MED ORDER — FENTANYL CITRATE (PF) 100 MCG/2ML IJ SOLN
INTRAMUSCULAR | Status: AC
  Administered 2018-08-22: 25 ug via INTRAVENOUS
  Filled 2018-08-22: qty 2

## 2018-08-22 MED ORDER — EPHEDRINE SULFATE 50 MG/ML IJ SOLN
INTRAMUSCULAR | Status: AC
  Filled 2018-08-22: qty 1

## 2018-08-22 MED ORDER — ENSURE SURGERY PO LIQD
237.0000 mL | Freq: Two times a day (BID) | ORAL | Status: DC
  Administered 2018-08-23: 237 mL via ORAL
  Filled 2018-08-22: qty 237

## 2018-08-22 MED ORDER — GLYCOPYRROLATE 0.2 MG/ML IJ SOLN
INTRAMUSCULAR | Status: DC | PRN
  Administered 2018-08-22: 0.2 mg via INTRAVENOUS

## 2018-08-22 MED ORDER — DEXAMETHASONE SODIUM PHOSPHATE 10 MG/ML IJ SOLN
INTRAMUSCULAR | Status: AC
  Filled 2018-08-22: qty 1

## 2018-08-22 MED ORDER — LIDOCAINE HCL (CARDIAC) PF 100 MG/5ML IV SOSY
PREFILLED_SYRINGE | INTRAVENOUS | Status: DC | PRN
  Administered 2018-08-22: 100 mg via INTRAVENOUS

## 2018-08-22 MED ORDER — LACTATED RINGERS IV SOLN
INTRAVENOUS | Status: DC
  Administered 2018-08-22 – 2018-08-23 (×2): via INTRAVENOUS

## 2018-08-22 MED ORDER — ONDANSETRON HCL 4 MG PO TABS: 4.0000 mg | ORAL_TABLET | Freq: Four times a day (QID) | ORAL | Status: DC | PRN

## 2018-08-22 MED ORDER — OXYCODONE HCL 5 MG PO TABS: 5.0000 mg | ORAL_TABLET | Freq: Once | ORAL | Status: DC | PRN

## 2018-08-22 MED ORDER — FAMOTIDINE 20 MG PO TABS
ORAL_TABLET | ORAL | Status: AC
  Filled 2018-08-22: qty 1

## 2018-08-22 MED ORDER — LEVOTHYROXINE SODIUM 50 MCG PO TABS
25.0000 ug | ORAL_TABLET | Freq: Every day | ORAL | Status: DC
  Administered 2018-08-23 – 2018-08-29 (×6): 25 ug via ORAL
  Filled 2018-08-22 (×7): qty 1

## 2018-08-22 MED ORDER — FENTANYL CITRATE (PF) 100 MCG/2ML IJ SOLN
25.0000 ug | INTRAMUSCULAR | Status: DC | PRN
  Administered 2018-08-22 (×4): 25 ug via INTRAVENOUS

## 2018-08-22 MED ORDER — PANTOPRAZOLE SODIUM 40 MG PO TBEC
40.0000 mg | DELAYED_RELEASE_TABLET | Freq: Every day | ORAL | Status: DC
  Administered 2018-08-23 – 2018-08-29 (×7): 40 mg via ORAL
  Filled 2018-08-22 (×7): qty 1

## 2018-08-22 MED ORDER — ONDANSETRON HCL 4 MG/2ML IJ SOLN
INTRAMUSCULAR | Status: DC | PRN
  Administered 2018-08-22: 4 mg via INTRAVENOUS

## 2018-08-22 MED ORDER — ENOXAPARIN SODIUM 40 MG/0.4ML ~~LOC~~ SOLN
40.0000 mg | SUBCUTANEOUS | Status: DC
  Administered 2018-08-23 – 2018-08-29 (×7): 40 mg via SUBCUTANEOUS
  Filled 2018-08-22 (×7): qty 0.4

## 2018-08-22 MED ORDER — SODIUM CHLORIDE FLUSH 0.9 % IV SOLN
INTRAVENOUS | Status: AC
  Filled 2018-08-22: qty 40

## 2018-08-22 SURGICAL SUPPLY — 66 items
"PENCIL ELECTRO HAND CTR " (MISCELLANEOUS) ×1 IMPLANT
ADH SKN CLS APL DERMABOND .7 (GAUZE/BANDAGES/DRESSINGS) ×2
BLADE SURG SZ10 CARB STEEL (BLADE) ×3 IMPLANT
CANISTER SUCT 1200ML W/VALVE (MISCELLANEOUS) ×3 IMPLANT
COVER WAND RF STERILE (DRAPES) ×1 IMPLANT
DECANTER SPIKE VIAL GLASS SM (MISCELLANEOUS) ×3 IMPLANT
DERMABOND ADVANCED (GAUZE/BANDAGES/DRESSINGS) ×4
DERMABOND ADVANCED .7 DNX12 (GAUZE/BANDAGES/DRESSINGS) ×2 IMPLANT
DRAPE INCISE IOBAN 66X45 STRL (DRAPES) ×1 IMPLANT
DRAPE SHEET LG 3/4 BI-LAMINATE (DRAPES) ×2 IMPLANT
ELECT BLADE 6.5 EXT (BLADE) ×3 IMPLANT
ELECT CAUTERY BLADE 6.4 (BLADE) ×3 IMPLANT
ELECT REM PT RETURN 9FT ADLT (ELECTROSURGICAL) ×3
ELECTRODE REM PT RTRN 9FT ADLT (ELECTROSURGICAL) ×1 IMPLANT
GLOVE BIOGEL PI IND STRL 7.0 (GLOVE) ×2 IMPLANT
GLOVE BIOGEL PI INDICATOR 7.0 (GLOVE) ×4
GLOVE SURG SYN 7.0 (GLOVE) ×6 IMPLANT
GLOVE SURG SYN 7.0 PF PI (GLOVE) ×2 IMPLANT
GOWN STRL REUS W/ TWL LRG LVL3 (GOWN DISPOSABLE) ×4 IMPLANT
GOWN STRL REUS W/TWL LRG LVL3 (GOWN DISPOSABLE) ×12
HANDLE YANKAUER SUCT BULB TIP (MISCELLANEOUS) ×3 IMPLANT
HEMOSTAT SURGICEL 2X3 (HEMOSTASIS) ×4 IMPLANT
HOLDER FOLEY CATH W/STRAP (MISCELLANEOUS) ×3 IMPLANT
IRRIGATION STRYKERFLOW (MISCELLANEOUS) IMPLANT
IRRIGATOR STRYKERFLOW (MISCELLANEOUS)
IV NS 1000ML (IV SOLUTION) ×3
IV NS 1000ML BAXH (IV SOLUTION) ×1 IMPLANT
L-HOOK LAP DISP 36CM (ELECTROSURGICAL) ×3
LHOOK LAP DISP 36CM (ELECTROSURGICAL) IMPLANT
NEEDLE HYPO 22GX1.5 SAFETY (NEEDLE) ×3 IMPLANT
NS IRRIG 1000ML POUR BTL (IV SOLUTION) ×3 IMPLANT
PACK COLON CLEAN CLOSURE (MISCELLANEOUS) ×3 IMPLANT
PACK LAP CHOLECYSTECTOMY (MISCELLANEOUS) ×3 IMPLANT
PENCIL ELECTRO HAND CTR (MISCELLANEOUS) ×3 IMPLANT
PORT ACCESS TROCAR AIRSEAL 5 (TROCAR) ×4 IMPLANT
RELOAD PROXIMATE 75MM BLUE (ENDOMECHANICALS) ×6 IMPLANT
RELOAD STAPLE 75 3.8 BLU REG (ENDOMECHANICALS) IMPLANT
RETAINER VISCERA MED (MISCELLANEOUS) ×2 IMPLANT
RETRACTOR WND ALEXIS-O 25 LRG (MISCELLANEOUS) IMPLANT
RETRACTOR WOUND ALXS 18CM MED (MISCELLANEOUS) IMPLANT
RTRCTR WOUND ALEXIS O 18CM MED (MISCELLANEOUS)
RTRCTR WOUND ALEXIS O 25CM LRG (MISCELLANEOUS) ×3
SET TRI-LUMEN FLTR TB AIRSEAL (TUBING) ×3 IMPLANT
SHEARS HARMONIC ACE PLUS 36CM (ENDOMECHANICALS) ×3 IMPLANT
SLEEVE ENDOPATH XCEL 5M (ENDOMECHANICALS) ×3 IMPLANT
SPONGE LAP 18X18 RF (DISPOSABLE) ×10 IMPLANT
STAPLER GUN LINEAR PROX 60 (STAPLE) ×2 IMPLANT
STAPLER PROXIMATE 75MM BLUE (STAPLE) ×2 IMPLANT
STAPLER SKIN PROX 35W (STAPLE) ×2 IMPLANT
SUT MNCRL 4-0 (SUTURE) ×6
SUT MNCRL 4-0 27XMFL (SUTURE) ×2
SUT PDS AB 0 CT1 27 (SUTURE) ×6 IMPLANT
SUT SILK 2 0 (SUTURE) ×3
SUT SILK 2 0 SH CR/8 (SUTURE) ×3 IMPLANT
SUT SILK 2-0 30XBRD TIE 12 (SUTURE) ×1 IMPLANT
SUT VIC AB 2-0 SH 27 (SUTURE) ×3
SUT VIC AB 2-0 SH 27XBRD (SUTURE) ×1 IMPLANT
SUT VICRYL 0 AB UR-6 (SUTURE) ×6 IMPLANT
SUTURE MNCRL 4-0 27XMF (SUTURE) ×2 IMPLANT
SYR 20CC LL (SYRINGE) ×4 IMPLANT
SYR 30ML LL (SYRINGE) ×3 IMPLANT
SYS LAPSCP GELPORT 120MM (MISCELLANEOUS) ×3
SYSTEM LAPSCP GELPORT 120MM (MISCELLANEOUS) ×1 IMPLANT
TOWEL OR 17X26 4PK STRL BLUE (TOWEL DISPOSABLE) ×2 IMPLANT
TRAY FOLEY MTR SLVR 16FR STAT (SET/KITS/TRAYS/PACK) ×3 IMPLANT
TROCAR XCEL NON-BLD 5MMX100MML (ENDOMECHANICALS) ×3 IMPLANT

## 2018-08-22 NOTE — Transfer of Care (Signed)
Immediate Anesthesia Transfer of Care Note  Patient: Cassie Carlson  Procedure(s) Performed: COLON RESECTION LAPAROSCOPIC (N/A Abdomen)  Patient Location: PACU  Anesthesia Type:General  Level of Consciousness: sedated  Airway & Oxygen Therapy: Patient Spontanous Breathing and Patient connected to face mask oxygen  Post-op Assessment: Report given to RN and Post -op Vital signs reviewed and stable  Post vital signs: Reviewed and stable  Last Vitals:  Vitals Value Taken Time  BP 124/57 08/22/2018  2:57 PM  Temp    Pulse 72 08/22/2018  3:01 PM  Resp 21 08/22/2018  3:01 PM  SpO2 100 % 08/22/2018  3:01 PM  Vitals shown include unvalidated device data.  Last Pain:  Vitals:   08/22/18 0609  TempSrc: Tympanic  PainSc: 0-No pain         Complications: No apparent anesthesia complications

## 2018-08-22 NOTE — Anesthesia Preprocedure Evaluation (Signed)
Anesthesia Evaluation  Patient identified by MRN, date of birth, ID band Patient awake    Reviewed: Allergy & Precautions, H&P , NPO status , Patient's Chart, lab work & pertinent test results  History of Anesthesia Complications (+) PONV, PROLONGED EMERGENCE, Family history of anesthesia reaction and history of anesthetic complications  Airway Mallampati: III  TM Distance: <3 FB Neck ROM: limited    Dental  (+) Chipped, Poor Dentition, Missing   Pulmonary neg shortness of breath, sleep apnea , former smoker,           Cardiovascular Exercise Tolerance: Good (-) angina(-) Past MI and (-) DOE negative cardio ROS       Neuro/Psych  Headaches, PSYCHIATRIC DISORDERS  Neuromuscular disease negative psych ROS   GI/Hepatic Neg liver ROS, GERD  Medicated and Controlled,  Endo/Other  Hypothyroidism   Renal/GU      Musculoskeletal  (+) Arthritis ,   Abdominal   Peds  Hematology negative hematology ROS (+)   Anesthesia Other Findings Past Medical History: No date: Anxiety No date: Arthritis No date: Dementia ([place]) No date: Depression No date: Family history of adverse reaction to anesthesia     Comment:  "siister had issues with heart racing during lung               biopsy" No date: Family history of breast cancer No date: Family history of colon cancer No date: Family history of colon cancer No date: Family history of prostate cancer No date: Family history of prostate cancer No date: Fibroids     Comment:  excessive vaginal bleeding No date: GERD (gastroesophageal reflux disease) No date: Headache No date: Hypothyroidism No date: PONV (postoperative nausea and vomiting) No date: Sleep apnea  Past Surgical History: No date: ABDOMINAL HYSTERECTOMY 01/01/2015: ANTERIOR CERVICAL DECOMP/DISCECTOMY FUSION; N/A     Comment:  Procedure: ANTERIOR CERVICAL DECOMPRESSION/DISCECTOMY               FUSION CERVICAL  FIVE-SIX,CERVICAL SIX-SEVEN;  Surgeon:               Karie Chimera, MD;  Location: [facility];  Service:               Neurosurgery;  Laterality: N/A; 1990: AUGMENTATION MAMMAPLASTY; Bilateral No date: BREAST SURGERY No date: CHOLECYSTECTOMY 06/14/2018: COLONOSCOPY WITH PROPOFOL; N/A     Comment:  Procedure: COLONOSCOPY WITH PROPOFOL;  Surgeon:               Lollie Sails, MD;  Location: [facility]COPY;                Service: Endoscopy;  Laterality: N/A; No date: DILATION AND CURETTAGE OF UTERUS 06/14/2018: ESOPHAGOGASTRODUODENOSCOPY (EGD) WITH PROPOFOL; N/A     Comment:  Procedure: ESOPHAGOGASTRODUODENOSCOPY (EGD) WITH               PROPOFOL;  Surgeon: Lollie Sails, MD;  Location:               [facility] ENDOSCOPY;  Service: Endoscopy;  Laterality: N/A; 08/18/2012: EUS     Comment:  Procedure: UPPER ENDOSCOPIC ULTRASOUND (EUS) LINEAR;                Surgeon: Milus Banister, MD;  Location: [facility] ENDOSCOPY;                Service: Endoscopy;  Laterality: N/A; 1990: REDUCTION MAMMAPLASTY; Bilateral No date: SHOULDER ARTHROSCOPY; Left No date: VEIN LIGATION AND STRIPPING  BMI  Body Mass Index:  32.43 kg/m      Reproductive/Obstetrics negative OB ROS                             Anesthesia Physical Anesthesia Plan  ASA: III  Anesthesia Plan: General ETT   Post-op Pain Management:    Induction: Intravenous  PONV Risk Score and Plan: Ondansetron, Dexamethasone, Midazolam and Treatment may vary due to age or medical condition  Airway Management Planned: Oral ETT  Additional Equipment:   Intra-op Plan:   Post-operative Plan: Extubation in OR  Informed Consent: I have reviewed the patients History and Physical, chart, labs and discussed the procedure including the risks, benefits and alternatives for the proposed anesthesia with the patient or authorized representative who has indicated his/her understanding and acceptance.   Dental Advisory  Given  Plan Discussed with: Anesthesiologist, CRNA and Surgeon  Anesthesia Plan Comments: (Patient and family member consented for risks of anesthesia including but not limited to:  - adverse reactions to medications - damage to teeth, lips or other oral mucosa - sore throat or hoarseness - Damage to heart, brain, lungs or loss of life  They voiced understanding.)        Anesthesia Quick Evaluation

## 2018-08-22 NOTE — Anesthesia Post-op Follow-up Note (Signed)
Anesthesia QCDR form completed.        

## 2018-08-22 NOTE — Anesthesia Procedure Notes (Signed)
Procedure Name: Intubation Date/Time: 08/22/2018 10:30 AM Performed by: Justus Memory, CRNA Pre-anesthesia Checklist: Patient identified, Patient being monitored, Timeout performed, Emergency Drugs available and Suction available Patient Re-evaluated:Patient Re-evaluated prior to induction Oxygen Delivery Method: Circle system utilized Preoxygenation: Pre-oxygenation with 100% oxygen Induction Type: IV induction Ventilation: Mask ventilation without difficulty Laryngoscope Size: Miller and 2 Grade View: Grade I Tube type: Oral Tube size: 7.0 mm Number of attempts: 1 Airway Equipment and Method: Stylet Placement Confirmation: ETT inserted through vocal cords under direct vision,  positive ETCO2 and breath sounds checked- equal and bilateral Secured at: 19 (teeth) cm Tube secured with: Tape Dental Injury: Teeth and Oropharynx as per pre-operative assessment

## 2018-08-22 NOTE — Interval H&P Note (Signed)
History and Physical Interval Note:  08/22/2018 9:31 AM  Cassie Carlson  has presented today for surgery, with the diagnosis of TRANSVERSE COLON POLYP  The various methods of treatment have been discussed with the patient and family. After consideration of risks, benefits and other options for treatment, the patient has consented to  Procedure(s): COLON RESECTION LAPAROSCOPIC (N/A) as a surgical intervention .  The patient's history has been reviewed, patient examined, no change in status, stable for surgery.  I have reviewed the patient's chart and labs.  Questions were answered to the patient's satisfaction.     Cadon Raczka Lysle Pearl

## 2018-08-23 ENCOUNTER — Encounter: Payer: Self-pay | Admitting: Surgery

## 2018-08-23 LAB — CBC
HCT: 32 % — ABNORMAL LOW (ref 36.0–46.0)
HEMOGLOBIN: 10.3 g/dL — AB (ref 12.0–15.0)
MCH: 32.2 pg (ref 26.0–34.0)
MCHC: 32.2 g/dL (ref 30.0–36.0)
MCV: 100 fL (ref 80.0–100.0)
NRBC: 0 % (ref 0.0–0.2)
PLATELETS: 185 10*3/uL (ref 150–400)
RBC: 3.2 MIL/uL — AB (ref 3.87–5.11)
RDW: 12.2 % (ref 11.5–15.5)
WBC: 6.5 10*3/uL (ref 4.0–10.5)

## 2018-08-23 LAB — BASIC METABOLIC PANEL
ANION GAP: 3 — AB (ref 5–15)
BUN: 12 mg/dL (ref 8–23)
CALCIUM: 8 mg/dL — AB (ref 8.9–10.3)
CHLORIDE: 110 mmol/L (ref 98–111)
CO2: 29 mmol/L (ref 22–32)
Creatinine, Ser: 0.86 mg/dL (ref 0.44–1.00)
GFR calc non Af Amer: >60 mL/min (ref >60)
Glucose, Bld: 141 mg/dL — ABNORMAL HIGH (ref 70–99)
POTASSIUM: 4.1 mmol/L (ref 3.5–5.1)
Sodium: 142 mmol/L (ref 135–145)

## 2018-08-23 LAB — MAGNESIUM: MAGNESIUM: 2 mg/dL (ref 1.7–2.4)

## 2018-08-23 LAB — HEMOGLOBIN AND HEMATOCRIT, BLOOD
HCT: 30.2 % — ABNORMAL LOW (ref 36.0–46.0)
Hemoglobin: 10 g/dL — ABNORMAL LOW (ref 12.0–15.0)

## 2018-08-23 LAB — PHOSPHORUS: PHOSPHORUS: 3.3 mg/dL (ref 2.5–4.6)

## 2018-08-23 MED ORDER — GABAPENTIN 100 MG PO CAPS
200.0000 mg | ORAL_CAPSULE | Freq: Two times a day (BID) | ORAL | Status: DC
  Administered 2018-08-23 – 2018-08-24 (×3): 200 mg via ORAL
  Filled 2018-08-23 (×3): qty 2

## 2018-08-23 MED ORDER — MORPHINE SULFATE (PF) 2 MG/ML IV SOLN
2.0000 mg | INTRAVENOUS | Status: DC | PRN
  Administered 2018-08-23 (×2): 2 mg via INTRAVENOUS
  Filled 2018-08-23 (×3): qty 1

## 2018-08-23 MED ORDER — KCL IN DEXTROSE-NACL 40-5-0.45 MEQ/L-%-% IV SOLN
INTRAVENOUS | Status: DC
  Administered 2018-08-23 – 2018-08-24 (×4): via INTRAVENOUS
  Filled 2018-08-23 (×5): qty 1000

## 2018-08-23 MED ORDER — CELECOXIB 200 MG PO CAPS
200.0000 mg | ORAL_CAPSULE | Freq: Every day | ORAL | Status: DC
  Administered 2018-08-23 – 2018-08-28 (×6): 200 mg via ORAL
  Filled 2018-08-23 (×7): qty 1

## 2018-08-23 NOTE — Op Note (Addendum)
Preoperative diagnosis: Unresectable transverse colon polyp Postoperative diagnosis: same  Procedure: Laparoscopic converted to open transverse colon colectomy with primary anastomosis.   Anesthesia: GETA  Surgeon: Lysle Pearl Assistant: Cintron-Diaz  Wound Classification: Clean Contaminated  Indications: Patient is a 72 y.o. female with  Above.   Specimen: transverse Colon  Complications: None  Estimated Blood Loss:  75 mL  Findings: 1. Dense adhesions between the omentum, colon, and mesentery  2.  Grade 1 splenic laceration, hemostasis controlled with electrocautery and Surgicel.  Details of operation: The patient was brought to the operating room and placed on the operating table in the supine position. The abdomen prepped with 2% chlorhexidine solution, and draped in the standard fashion. Foley placed.  A time-out was completed verifying correct patient, procedure, site, positioning, and implant(s) and/or special equipment prior to beginning this procedure. A vertical 5 cm periumbilical incision was used to access the peritoneal cavity and a Gelport was placed.  Under direct visualization a 7mm RUQ trocar was introduced and pneumoperitoneum was achieved. Another 5 mm trocar was introduced on the RLQ area. The patient was then positioned in 30 reverse Trendelenburg position with the patient's right side down to allow for small bowel to drop clear of the operative field.  With a hand assisted technique, the transverse colon was grasped with the right hand of the operating surgeon and lifted up to tent the mesentary and omentum.  The dissection was then started at the area distal to the distal ink to separate the omentum from the transverse colon.  This proved extremely difficult due to a very large omentum with multiple attachments to the colon, mesentary, stomach, and spleen.  Some dissection was carried out along the hepatic and splenic flexure laparoscopically, but a small spleen laceration  was noted during this portion.  The bleeding was controlled with packing and and dissection attempted laparoscopically, TOTALING OVER 40MIN OF just lysis of adhesions, but lacked progression.  At this point the procedure was converted to an open procedure.   Transverse colon was fully mobilized by taking down the splenic flexure attachments as well as the hepatic flexure attachments.  The Transverse colon then divided using GIA vascular load proximal and distal to the inked region that was noted upon initial entry.  The mesentary was then divided using harmonic and suture ligation of the palpable vessels with 3-0 silk.  Care was noted to not injure the duodenum during this portion.     The specimen was removed off operative field intact with long proximal and short distal sutures.  The proximal ascending colon and the distal descending colon were then aligned, ensuring that the mesentery was not twisted, and a stapled end-to-end anastomosis was created in the usual manner using 84mm stapler.  The new staple line anastamosis was lembert sutured. The mesenteric defect was then closed with Vicryl 2-0 sutures and the bowel reintroduced into the peritoneal cavity.  Reinspection of the splenic laceration noted a 1 cm laceration at the inferior tip of the spleen.  This was controlled with electrocautery and placement of Surgicel.  Abdominal wound extensively irrigated.  No bleeding or injury noted with one last inspection.    Gloves and instruments were switched out and the fascia was closed with running PDS 0 x2.   Exparel infused to incisions prior to closing.  3-0 vicryl used to close subcutaneous layer at the hand port incision prior to closing all skin incisions with stapler.  Wounds then dressed with honeycomb sponge, 2x2, and tegarderm.  The sponge and instrument count  The patient tolerated the procedure well and was transferred to the post-anesthesia care unit in stable condition.

## 2018-08-23 NOTE — Progress Notes (Signed)
Subjective:  CC:  Cassie Carlson is a 72 y.o. female  Hospital stay day 1, 1 Day Post-Op lap converted to open transverse colectomy  HPI: Pt states she is very sore and slept poorly.  No flatus or BM recorded.  ROS:  A 5 point review of systems was performed and pertinent positives and negatives noted in HPI.   Objective:      Temp:  [97.2 F (36.2 C)-98.3 F (36.8 C)] 97.6 F (36.4 C) (11/05 0843) Pulse Rate:  [67-85] 83 (11/05 0843) Resp:  [14-24] 16 (11/05 0843) BP: (107-142)/(49-97) 142/69 (11/05 0843) SpO2:  [94 %-100 %] 94 % (11/05 0843) Weight:  [90 kg] 90 kg (11/05 0411)     Height: 5\' 4"  (162.6 cm) Weight: 90 kg BMI (Calculated): 34.04   Intake/Output this shift:  Total I/O In: 235 [I.V.:235] Out: 350 [Urine:350]       Constitutional :  alert, cooperative, appears stated age and mild distress  Respiratory:  clear to auscultation bilaterally  Cardiovascular:  regular rate and rhythm  Gastrointestinal: diffuse tenderness, but overall still soft, with minimal guarding with distraction.   Skin: Cool and moist.   Psychiatric: Normal affect, non-agitated, not confused       LABS:  CMP Latest Ref Rng & Units 08/23/2018 08/22/2018 01/20/2017  Glucose 70 - 99 mg/dL 141(H) - -  BUN 8 - 23 mg/dL 12 - -  Creatinine 0.44 - 1.00 mg/dL 0.86 0.90 0.90  Sodium 135 - 145 mmol/L 142 - -  Potassium 3.5 - 5.1 mmol/L 4.1 - -  Chloride 98 - 111 mmol/L 110 - -  CO2 22 - 32 mmol/L 29 - -  Calcium 8.9 - 10.3 mg/dL 8.0(L) - -   CBC Latest Ref Rng & Units 08/23/2018 08/22/2018 08/16/2018  WBC 4.0 - 10.5 K/uL 6.5 9.0 3.8(L)  Hemoglobin 12.0 - 15.0 g/dL 10.3(L) 11.1(L) 12.3  Hematocrit 36.0 - 46.0 % 32.0(L) 32.7(L) 38.4  Platelets 150 - 400 K/uL 185 210 230    RADS: n/a Assessment:   S/p lap converted to open transverse colectomy  Will start gabapentin, celebrex in addition to prn morphine.  Back to NPO due to increased pain, will repeat H&H in pm to ensure she is not  continuing to bleed as possible source of her discomfort.  Foley out in the meantime.

## 2018-08-23 NOTE — Anesthesia Postprocedure Evaluation (Signed)
Anesthesia Post Note  Patient: SANAH KRASKA  Procedure(s) Performed: COLON RESECTION LAPAROSCOPIC (N/A Abdomen)  Patient location during evaluation: PACU Anesthesia Type: General Level of consciousness: awake and alert Pain management: pain level controlled Vital Signs Assessment: post-procedure vital signs reviewed and stable Respiratory status: spontaneous breathing, nonlabored ventilation, respiratory function stable and patient connected to nasal cannula oxygen Cardiovascular status: blood pressure returned to baseline and stable Postop Assessment: no apparent nausea or vomiting Anesthetic complications: no     Last Vitals:  Vitals:   08/23/18 0406 08/23/18 0843  BP: (!) 111/97 (!) 142/69  Pulse: 74 83  Resp: 16 16  Temp: 36.8 C 36.4 C  SpO2: 99% 94%    Last Pain:  Vitals:   08/23/18 0843  TempSrc: Oral  PainSc:                  Precious Haws Piscitello

## 2018-08-24 LAB — SURGICAL PATHOLOGY

## 2018-08-24 NOTE — Progress Notes (Signed)
Per MD pt can have a clear liquid diet.

## 2018-08-24 NOTE — Progress Notes (Signed)
Subjective:  CC:  Cassie Carlson is a 72 y.o. female  Hospital stay day 2, 2 Days Post-Op lap converted to open transverse colectomy  HPI: Pt states she is hallucinating and seeing shadows of deceased patients.  Had to orient her to time and place when aroused from sleep.  ROS:  A 5 point review of systems was performed and pertinent positives and negatives noted in HPI.   Objective:      Temp:  [97.6 F (36.4 C)-99.1 F (37.3 C)] 99 F (37.2 C) (11/06 0548) Pulse Rate:  [83-100] 91 (11/06 0548) Resp:  [16-19] 19 (11/06 0548) BP: (112-142)/(56-69) 112/56 (11/06 0548) SpO2:  [89 %-94 %] 93 % (11/06 0548) Weight:  [90.7 kg] 90.7 kg (11/06 0548)     Height: 5\' 4"  (162.6 cm) Weight: 90.7 kg BMI (Calculated): 34.31   Intake/Output this shift:  Total I/O In: 163.8 [I.V.:163.8] Out: -        Constitutional :  alert, cooperative, appears stated age, no distress and oriented after redirection  Respiratory:  clear to auscultation bilaterally  Cardiovascular:  regular rate and rhythm  Gastrointestinal: much improved tenderness where focal tenderness noted within incision line only now.   Skin: Cool and moist.   Psychiatric: Normal affect, non-agitated, not confused       LABS:  CMP Latest Ref Rng & Units 08/23/2018 08/22/2018 01/20/2017  Glucose 70 - 99 mg/dL 141(H) - -  BUN 8 - 23 mg/dL 12 - -  Creatinine 0.44 - 1.00 mg/dL 0.86 0.90 0.90  Sodium 135 - 145 mmol/L 142 - -  Potassium 3.5 - 5.1 mmol/L 4.1 - -  Chloride 98 - 111 mmol/L 110 - -  CO2 22 - 32 mmol/L 29 - -  Calcium 8.9 - 10.3 mg/dL 8.0(L) - -   CBC Latest Ref Rng & Units 08/23/2018 08/23/2018 08/22/2018  WBC 4.0 - 10.5 K/uL - 6.5 9.0  Hemoglobin 12.0 - 15.0 g/dL 10.0(L) 10.3(L) 11.1(L)  Hematocrit 36.0 - 46.0 % 30.2(L) 32.0(L) 32.7(L)  Platelets 150 - 400 K/uL - 185 210    RADS: n/a Assessment:   S/p lap converted to open transverse colectomy  Restart clears, going to stop gabapentin, and the morphine due to  hallucinations.  Will continue to monitor closely, and also order PT to work with her as well.

## 2018-08-24 NOTE — Evaluation (Signed)
Physical Therapy Evaluation Patient Details Name: Cassie Carlson MRN: [id number] DOB: December 03, 1945 Today's Date: 08/24/2018   History of Present Illness  Patient is a 72 year old female admitted following transverse colon colectomy.  PMh includes colon resection 1 mo ago, HA, dementia, depression, arthritis and anxiety.  Clinical Impression  Patient is a 72 year old female who lives in a one story home alone.  She is independent without use of AD at baseline.  Pt in bed and able to perform log roll with min VC's to get to EOB.  She presented with fair/good strength of UE/LE and reported no sensation loss.  Pt able to stand from bedside without physical assist to rise, though she required an increased amount of time.  Pt able to ambulate 40 ft in room with hand held assist or holding to furniture. Pt stated that she felt "shaky" and returned to chair.  Pt ambulated earlier in the day with nursing and required a RW for longer distances for stability and energy conservation.  PT introduced there ex which pt can perform in chair or bed and pt was able to demonstrate quick learning and response.  She reported no higher than 1/10 pain throughout evaluation.  Pt will continue to benefit from skilled PT with focus on tolerance to activity, use of AD and fall prevention and safe functional mobility for pain management.    Follow Up Recommendations Home health PT;Supervision - Intermittent    Equipment Recommendations  Rolling walker with 5" wheels    Recommendations for Other Services       Precautions / Restrictions Precautions Precautions: Fall Restrictions Weight Bearing Restrictions: No      Mobility  Bed Mobility Overal bed mobility: Needs Assistance Bed Mobility: Supine to Sit     Supine to sit: Supervision     General bed mobility comments: Pt able to perform log rolling  Transfers                    Ambulation/Gait                Stairs             Wheelchair Mobility    Modified Rankin (Stroke Patients Only)       Balance Overall balance assessment: Needs assistance Sitting-balance support: Single extremity supported Sitting balance-Leahy Scale: Good     Standing balance support: Single extremity supported Standing balance-Leahy Scale: Fair Standing balance comment: Pt able to walk with hand held assist or while holding furniture.  Pt walked a greater distance with nursing earlier and required a RW for 200 ft of ambulation.                             Pertinent Vitals/Pain Pain Assessment: 0-10 Pain Score: 1  Pain Location: Surgical site with mobility    Home Living Family/patient expects to be discharged to:: Private residence Living Arrangements: Alone Available Help at Discharge: Friend(s);Available 24 hours/day Type of Home: House Home Access: Stairs to enter Entrance Stairs-Rails: Can reach both Entrance Stairs-Number of Steps: 4 Home Layout: One level Home Equipment: None;Tub bench      Prior Function Level of Independence: Independent               Hand Dominance        Extremity/Trunk Assessment   Upper Extremity Assessment Upper Extremity Assessment: Overall WFL for tasks assessed(Grossly 4-/5 bilaterally.  No sensation loss  noted.)    Lower Extremity Assessment Lower Extremity Assessment: Overall WFL for tasks assessed(Grossly 4/5 bilaterally with no sensation loss reported.)    Cervical / Trunk Assessment Cervical / Trunk Assessment: Normal  Communication   Communication: No difficulties  Cognition Arousal/Alertness: Lethargic Behavior During Therapy: WFL for tasks assessed/performed Overall Cognitive Status: History of cognitive impairments - at baseline                                 General Comments: Pt able to communicate and follow commands.  Appears to lose focus intermittently throughout evaluation.  Feels "shaky" and thinks this is because she  hasn't eaten.      General Comments      Exercises General Exercises - Lower Extremity Ankle Circles/Pumps: 20 reps;Seated;Both;AROM;Strengthening Quad Sets: Both;10 reps;Strengthening;Seated Other Exercises Other Exercises: Education regarding use of AD for recovery and working with PT to regain independence.  x8 min   Assessment/Plan    PT Assessment Patient needs continued PT services  PT Problem List Decreased strength;Decreased mobility;Decreased balance;Decreased knowledge of use of DME;Decreased activity tolerance       PT Treatment Interventions DME instruction;Therapeutic activities;Gait training;Therapeutic exercise;Stair training;Balance training;Functional mobility training;Patient/family education    PT Goals (Current goals can be found in the Care Plan section)  Acute Rehab PT Goals Patient Stated Goal: To return to independent mobility enough to work in her garden. PT Goal Formulation: With patient Time For Goal Achievement: 09/14/18 Potential to Achieve Goals: Good    Frequency Min 2X/week   Barriers to discharge        Co-evaluation               AM-PAC PT "6 Clicks" Daily Activity  Outcome Measure Difficulty turning over in bed (including adjusting bedclothes, sheets and blankets)?: A Little Difficulty moving from lying on back to sitting on the side of the bed? : A Lot Difficulty sitting down on and standing up from a chair with arms (e.g., wheelchair, bedside commode, etc,.)?: A Little Help needed moving to and from a bed to chair (including a wheelchair)?: A Little Help needed walking in hospital room?: A Little Help needed climbing 3-5 steps with a railing? : A Little 6 Click Score: 17    End of Session Equipment Utilized During Treatment: Gait belt Activity Tolerance: Patient limited by fatigue(Pt feels "shaky"; has not eaten lunch sitting on table and pt thinks decreased intake is the reason for this.) Patient left: in chair;with chair  alarm set;with call bell/phone within reach Nurse Communication: Mobility status PT Visit Diagnosis: Unsteadiness on feet (R26.81);Muscle weakness (generalized) (M62.81)    Time: 8003-4917 PT Time Calculation (min) (ACUTE ONLY): 19 min   Charges:   PT Evaluation $PT Eval Low Complexity: 1 Low PT Treatments $Therapeutic Activity: 8-22 mins        Roxanne Gates, PT, DPT   Roxanne Gates 08/24/2018, 2:40 PM

## 2018-08-25 ENCOUNTER — Inpatient Hospital Stay: Payer: Medicare Other

## 2018-08-25 LAB — CBC
HEMATOCRIT: 31.7 % — AB (ref 36.0–46.0)
Hemoglobin: 10.1 g/dL — ABNORMAL LOW (ref 12.0–15.0)
MCH: 32.1 pg (ref 26.0–34.0)
MCHC: 31.9 g/dL (ref 30.0–36.0)
MCV: 100.6 fL — AB (ref 80.0–100.0)
NRBC: 0 % (ref 0.0–0.2)
Platelets: 213 10*3/uL (ref 150–400)
RBC: 3.15 MIL/uL — AB (ref 3.87–5.11)
RDW: 12 % (ref 11.5–15.5)
WBC: 6.4 10*3/uL (ref 4.0–10.5)

## 2018-08-25 LAB — BASIC METABOLIC PANEL
ANION GAP: 6 (ref 5–15)
BUN: 6 mg/dL — AB (ref 8–23)
CO2: 27 mmol/L (ref 22–32)
Calcium: 8 mg/dL — ABNORMAL LOW (ref 8.9–10.3)
Chloride: 106 mmol/L (ref 98–111)
Creatinine, Ser: 0.95 mg/dL (ref 0.44–1.00)
GFR calc non Af Amer: 58 mL/min — ABNORMAL LOW (ref >60)
Glucose, Bld: 142 mg/dL — ABNORMAL HIGH (ref 70–99)
Potassium: 4.6 mmol/L (ref 3.5–5.1)
SODIUM: 139 mmol/L (ref 135–145)

## 2018-08-25 MED ORDER — LACTATED RINGERS IV SOLN
INTRAVENOUS | Status: DC
  Administered 2018-08-25 – 2018-08-27 (×4): via INTRAVENOUS

## 2018-08-25 MED ORDER — FAMOTIDINE IN NACL 20-0.9 MG/50ML-% IV SOLN
20.0000 mg | Freq: Two times a day (BID) | INTRAVENOUS | Status: DC
  Administered 2018-08-25 – 2018-08-28 (×6): 20 mg via INTRAVENOUS
  Filled 2018-08-25 (×6): qty 50

## 2018-08-25 NOTE — Progress Notes (Signed)
Subjective:  CC:  Cassie Carlson is a 72 y.o. female  Hospital stay day 3, 3 Days Post-Op lap converted to open transverse colectomy  HPI: Pt states hallucinations resolved.  Has not needed IV meds.  Still no flatus or BM and had episode of nausea late last night, no emesis.  Had clears all day yesterday.   ROS:  A 5 point review of systems was performed and pertinent positives and negatives noted in HPI.   Objective:      Temp:  [97.6 F (36.4 C)-97.8 F (36.6 C)] 97.6 F (36.4 C) (11/07 0448) Pulse Rate:  [82-88] 82 (11/07 0448) Resp:  [18] 18 (11/07 0448) BP: (111-127)/(51-63) 127/60 (11/07 0448) SpO2:  [91 %-96 %] 92 % (11/07 0448) Weight:  [91.6 kg] 91.6 kg (11/07 0500)     Height: 5\' 4"  (162.6 cm) Weight: 91.6 kg BMI (Calculated): 34.65   Intake/Output this shift:  No intake/output data recorded.       Constitutional :  alert, cooperative, appears stated age and no distress  Respiratory:  clear to auscultation bilaterally  Cardiovascular:  regular rate and rhythm  Gastrointestinal:  focal tenderness noted within incision line, no obvious improvement from yesterday.  all staple lines c/d/i.   Skin: Cool and moist.   Psychiatric: Normal affect, non-agitated, not confused       LABS:  CMP Latest Ref Rng & Units 08/23/2018 08/22/2018 01/20/2017  Glucose 70 - 99 mg/dL 141(H) - -  BUN 8 - 23 mg/dL 12 - -  Creatinine 0.44 - 1.00 mg/dL 0.86 0.90 0.90  Sodium 135 - 145 mmol/L 142 - -  Potassium 3.5 - 5.1 mmol/L 4.1 - -  Chloride 98 - 111 mmol/L 110 - -  CO2 22 - 32 mmol/L 29 - -  Calcium 8.9 - 10.3 mg/dL 8.0(L) - -   CBC Latest Ref Rng & Units 08/23/2018 08/23/2018 08/22/2018  WBC 4.0 - 10.5 K/uL - 6.5 9.0  Hemoglobin 12.0 - 15.0 g/dL 10.0(L) 10.3(L) 11.1(L)  Hematocrit 36.0 - 46.0 % 30.2(L) 32.0(L) 32.7(L)  Platelets 150 - 400 K/uL - 185 210    RADS: n/a Assessment:   S/p lap converted to open transverse colectomy  Continue with clears for now.  Will get KUB  for baseline.  The large amount of moprhine needed POD#1 maybe contributing to slow return of bowel function.  Continue encouraging ambulation and OOB to chair.

## 2018-08-25 NOTE — Progress Notes (Addendum)
Notified Dr. Lysle Pearl pt is getting confuse and states she feels sick. Per MD nurse to order STAT CBC and BMP. MD will come to see pt. Also change pt to NPO.

## 2018-08-25 NOTE — Care Management Important Message (Signed)
Copy of signed IM left with patient in room.  

## 2018-08-25 NOTE — Progress Notes (Signed)
Notified MD pt's X ray results.

## 2018-08-25 NOTE — Progress Notes (Signed)
08/25/18  Called by Dr. Lysle Pearl to examine this patient, s/p lap converted to open transverse colectomy.  KUB this morning had showed post-op ileus.  Throughout the day, she has had more nausea and required two doses of IV Zofran per her RN.  She also reports feeling sick and is also confused.  She is also having hiccups.  On exam, she is distended and appropriately tender.  Midline incision is clean and dry.  She is oriented to name, year, president, but not to location, and keeps asking if she's going back to the hospital.    Given her symptoms, I believe her ileus is worsening.  Have made her NPO, started her on IV fluid hydration, and placed order for NG tube placement.  Discussed with Dr. Lysle Pearl who agrees.  She currently has labs pending to make sure her electrolytes are ok and not contributing to her confusion.  Olean Ree, MD

## 2018-08-26 NOTE — Care Management (Addendum)
Patient S/p lap converted to open transverse colectomy.  With increased confusion.  At baseline patient lives at home alone. Daughter Sharyn Lull lives in [place].  RNCM spoke with daughter via phone, due to patient being confused and currently having a telesitter.   Daughter states that baseline patient is independent, completes all ADL's, and drives.  Patient has a friend that lives locally [employer].  PCP [place].  Per daughter there are no issues obtaining medications that she is aware of.   Daughter states that she is catching a [employer] to [place] today, and will have to return to [place] on Tuesday.  RNCM informed daughter that PT has assessed patient and currently recommending home health PT.  Daughter is concerned that if her mothers mental status does not improves she will not be able to return home.    RNCM briefly discussed qualifications for placement, and that currently her mother does not meet criteria.  Discussed option to pay out of pocket for placement and cost.  Discussed PCS services and option for someone to stay with patient in her home in addition to home health services. Per Joelene Millin with [facility] they have received home health orders outpatient and are following the case.  Daughter is aware that [facility] services is an out of pocket expense.  PCS list faxed to daughter at [phone]. Daughter states "if she doesn't get any better she is going to have to come back with me to [place]".  RNCM following

## 2018-08-26 NOTE — Progress Notes (Signed)
Subjective:  CC:  Cassie Carlson is a 72 y.o. female  Hospital stay day 4, 4 Days Post-Op lap converted to open transverse colectomy  HPI: Increased confusion, nausea, and distention yesterday, repeat KUB showed worsening ileus.  Placed on NPO and NG placed. A&Ox3 this am, with NG in place.  Asking if she can eat.  ROS:  A 5 point review of systems was performed and pertinent positives and negatives noted in HPI.   Objective:      Temp:  [97.7 F (36.5 C)-98.5 F (36.9 C)] 98.3 F (36.8 C) (11/08 0746) Pulse Rate:  [81-93] 85 (11/08 0746) Resp:  [16-24] 19 (11/08 0746) BP: (121-132)/(61-67) 121/61 (11/08 0746) SpO2:  [90 %-97 %] 93 % (11/08 0746)     Height: 5\' 4"  (162.6 cm) Weight: 91.6 kg BMI (Calculated): 34.65   Intake/Output this shift:  Total I/O In: -  Out: 2 [Urine:1; Stool:1]       Constitutional :  alert, cooperative, appears stated age and no distress  Respiratory:  clear to auscultation bilaterally  Cardiovascular:  regular rate and rhythm  Gastrointestinal:  focal tenderness noted within incision line, no obvious improvement from yesterday.  all staple lines c/d/i.   Skin: Cool and moist.   Psychiatric: Normal affect, non-agitated, not confused       LABS:  CMP Latest Ref Rng & Units 08/25/2018 08/23/2018 08/22/2018  Glucose 70 - 99 mg/dL 142(H) 141(H) -  BUN 8 - 23 mg/dL 6(L) 12 -  Creatinine 0.44 - 1.00 mg/dL 0.95 0.86 0.90  Sodium 135 - 145 mmol/L 139 142 -  Potassium 3.5 - 5.1 mmol/L 4.6 4.1 -  Chloride 98 - 111 mmol/L 106 110 -  CO2 22 - 32 mmol/L 27 29 -  Calcium 8.9 - 10.3 mg/dL 8.0(L) 8.0(L) -   CBC Latest Ref Rng & Units 08/25/2018 08/23/2018 08/23/2018  WBC 4.0 - 10.5 K/uL 6.4 - 6.5  Hemoglobin 12.0 - 15.0 g/dL 10.1(L) 10.0(L) 10.3(L)  Hematocrit 36.0 - 46.0 % 31.7(L) 30.2(L) 32.0(L)  Platelets 150 - 400 K/uL 213 - 185    RADS: CLINICAL DATA:  NG tube placement  EXAM: ABDOMEN - 1 VIEW  COMPARISON:  KUB of  08/25/2017  FINDINGS: And NG tube is been placed and the tip is coiling in the fundus of the stomach. Both large and small bowel gas remains. There is some opacity medially at the left lung base which may represent atelectasis or pneumonia.  IMPRESSION: NG tube tip coils in the fundus of the stomach. Atelectasis or pneumonia at the medial left lung base.   Electronically Signed   By: Ivar Drape M.D.   On: 08/25/2018 17:14 Assessment:   S/p lap converted to open transverse colectomy  Maintain NPO and NG tube for now, BM noted in chart but RN and patient not aware of any at time of examination.  Chest findings on XR likely atelectasis, since patient is afebrile, no leukocytosis, and no coughing.

## 2018-08-26 NOTE — Progress Notes (Signed)
Pt. has had 4 episodes of intermittent  visual hallucination. Pt also pulled NG tube out this afternoon because pt said it is hurting her throat. No nausea and vomiting so far and had small BM this am. She is  still NPO though. She also walked twice around the nursing station.MD aware.

## 2018-08-27 ENCOUNTER — Inpatient Hospital Stay: Payer: Medicare Other

## 2018-08-27 LAB — MAGNESIUM: MAGNESIUM: 1.8 mg/dL (ref 1.7–2.4)

## 2018-08-27 LAB — CBC
HEMATOCRIT: 27 % — AB (ref 36.0–46.0)
HEMOGLOBIN: 8.7 g/dL — AB (ref 12.0–15.0)
MCH: 31.9 pg (ref 26.0–34.0)
MCHC: 32.2 g/dL (ref 30.0–36.0)
MCV: 98.9 fL (ref 80.0–100.0)
PLATELETS: 245 10*3/uL (ref 150–400)
RBC: 2.73 MIL/uL — ABNORMAL LOW (ref 3.87–5.11)
RDW: 12.1 % (ref 11.5–15.5)
WBC: 5.4 10*3/uL (ref 4.0–10.5)
nRBC: 0 % (ref 0.0–0.2)

## 2018-08-27 LAB — BASIC METABOLIC PANEL
Anion gap: 5 (ref 5–15)
BUN: 9 mg/dL (ref 8–23)
CO2: 27 mmol/L (ref 22–32)
CREATININE: 0.8 mg/dL (ref 0.44–1.00)
Calcium: 7.7 mg/dL — ABNORMAL LOW (ref 8.9–10.3)
Chloride: 107 mmol/L (ref 98–111)
GFR calc Af Amer: >60 mL/min (ref >60)
Glucose, Bld: 97 mg/dL (ref 70–99)
POTASSIUM: 3.7 mmol/L (ref 3.5–5.1)
SODIUM: 139 mmol/L (ref 135–145)

## 2018-08-27 LAB — PHOSPHORUS: Phosphorus: 1.9 mg/dL — ABNORMAL LOW (ref 2.5–4.6)

## 2018-08-27 MED ORDER — SODIUM CHLORIDE 0.9 % IV SOLN
INTRAVENOUS | Status: DC
  Administered 2018-08-27: 11:00:00 via INTRAVENOUS

## 2018-08-27 MED ORDER — POTASSIUM PHOSPHATES 15 MMOLE/5ML IV SOLN
20.0000 mmol | Freq: Once | INTRAVENOUS | Status: AC
  Administered 2018-08-27: 20 mmol via INTRAVENOUS
  Filled 2018-08-27: qty 6.67

## 2018-08-27 NOTE — Progress Notes (Signed)
CC: s/p Transverse  colectomy Subjective: POD # 5 PT apparently had a small BM and passed flatus KUB shows persistent dilation, no free air Apparently some hallucitations Objective: Vital signs in last 24 hours: Temp:  [97.9 F (36.6 C)-99.3 F (37.4 C)] 99.3 F (37.4 C) (11/09 1130) Pulse Rate:  [79-101] 101 (11/09 1130) Resp:  [16-20] 20 (11/09 0437) BP: (126-155)/(62-75) 155/72 (11/09 1130) SpO2:  [92 %-97 %] 94 % (11/09 1130) Weight:  [94.1 kg] 94.1 kg (11/09 0300) Last BM Date: 08/26/18  Intake/Output from previous day: 11/08 0701 - 11/09 0700 In: 1991.4 [I.V.:1891.4; IV Piggyback:100] Out: 2 [Urine:1; Stool:1] Intake/Output this shift: No intake/output data recorded.  Physical exam:  NAD, conversant and appropriate Abd: soft, midly distended w decrease bs. Incisions c/d/i, No peritonitis or infection  Lab Results: CBC  Recent Labs    08/25/18 1614 08/27/18 0425  WBC 6.4 5.4  HGB 10.1* 8.7*  HCT 31.7* 27.0*  PLT 213 245   BMET Recent Labs    08/25/18 1614 08/27/18 0425  NA 139 139  K 4.6 3.7  CL 106 107  CO2 27 27  GLUCOSE 142* 97  BUN 6* 9  CREATININE 0.95 0.80  CALCIUM 8.0* 7.7*   PT/INR No results for input(s): LABPROT, INR in the last 72 hours. ABG No results for input(s): PHART, HCO3 in the last 72 hours.  Invalid input(s): PCO2, PO2  Studies/Results: Dg Abd 1 View  Result Date: 08/25/2018 CLINICAL DATA:  NG tube placement EXAM: ABDOMEN - 1 VIEW COMPARISON:  KUB of 08/25/2017 FINDINGS: And NG tube is been placed and the tip is coiling in the fundus of the stomach. Both large and small bowel gas remains. There is some opacity medially at the left lung base which may represent atelectasis or pneumonia. IMPRESSION: NG tube tip coils in the fundus of the stomach. Atelectasis or pneumonia at the medial left lung base. Electronically Signed   By: Ivar Drape M.D.   On: 08/25/2018 17:14   Dg Abd Portable 2v  Result Date: 08/27/2018 CLINICAL  DATA:  Postoperative ileus.  Colon resection. EXAM: PORTABLE ABDOMEN - 2 VIEW COMPARISON:  08/25/2018 FINDINGS: Moderate amount of gas within small and large bowel, most consistent with ileus pattern. Air/gas in the right abdominal wall is persistent. No visible free intraperitoneal air. IMPRESSION: Persistent ileus pattern. No sign of free air. Air/gas in the right abdominal wall soft tissues is persistent. Electronically Signed   By: Nelson Chimes M.D.   On: 08/27/2018 08:48    Anti-infectives: Anti-infectives (From admission, onward)   Start     Dose/Rate Route Frequency Ordered Stop   08/22/18 0600  cefoTEtan (CEFOTAN) 2 g in sodium chloride 0.9 % 100 mL IVPB     2 g 200 mL/hr over 30 Minutes Intravenous On call to O.R. 08/21/18 2230 08/22/18 1035      Assessment/Plan:  Doing well Ileus resolving will start Clears Decrease ivf Mobilize DC within 48 hrs  Caroleen Hamman, MD, [facility]  08/27/2018

## 2018-08-27 NOTE — Care Management (Signed)
RNCm spoke with patient and daughter about transition of care. Daughter is from [place] and is main contact person. She has been in contact with our team and has been given all the options for discharge planning. She is understanding of the barriers there are and is realistic about what can be set up for her mother at discharge. Her current plan is to get home health via [facility] and hire PCS to cover the times HH is not there. She has been provided a list and is "interviewing" people now. Patient is still not medically cleared for discharge and we will continue to follow for transition of care needs.

## 2018-08-28 LAB — CBC
HCT: 27.4 % — ABNORMAL LOW (ref 36.0–46.0)
HEMOGLOBIN: 9 g/dL — AB (ref 12.0–15.0)
MCH: 31.9 pg (ref 26.0–34.0)
MCHC: 32.8 g/dL (ref 30.0–36.0)
MCV: 97.2 fL (ref 80.0–100.0)
Platelets: 230 10*3/uL (ref 150–400)
RBC: 2.82 MIL/uL — AB (ref 3.87–5.11)
RDW: 11.9 % (ref 11.5–15.5)
WBC: 8.7 10*3/uL (ref 4.0–10.5)
nRBC: 0 % (ref 0.0–0.2)

## 2018-08-28 LAB — PHOSPHORUS: Phosphorus: 2.4 mg/dL — ABNORMAL LOW (ref 2.5–4.6)

## 2018-08-28 LAB — BASIC METABOLIC PANEL
Anion gap: 8 (ref 5–15)
BUN: 6 mg/dL — ABNORMAL LOW (ref 8–23)
CO2: 25 mmol/L (ref 22–32)
CREATININE: 0.81 mg/dL (ref 0.44–1.00)
Calcium: 7.8 mg/dL — ABNORMAL LOW (ref 8.9–10.3)
Chloride: 102 mmol/L (ref 98–111)
GFR calc non Af Amer: >60 mL/min (ref >60)
GLUCOSE: 110 mg/dL — AB (ref 70–99)
Potassium: 3.4 mmol/L — ABNORMAL LOW (ref 3.5–5.1)
SODIUM: 135 mmol/L (ref 135–145)

## 2018-08-28 MED ORDER — POTASSIUM PHOSPHATE MONOBASIC 500 MG PO TABS
500.0000 mg | ORAL_TABLET | Freq: Three times a day (TID) | ORAL | Status: AC
  Administered 2018-08-28 (×3): 500 mg via ORAL
  Filled 2018-08-28 (×3): qty 1

## 2018-08-28 MED ORDER — HALOPERIDOL LACTATE 5 MG/ML IJ SOLN
5.0000 mg | Freq: Once | INTRAMUSCULAR | Status: DC
  Filled 2018-08-28: qty 1

## 2018-08-28 NOTE — Progress Notes (Addendum)
CC: colectomy Subjective: HAd some delirium today. Now she is better. I ordered hadol but pt much improved now and does not need it. Daughter in the room. She is taking PO, + BM AVSS Hb and creat ok, nml wbc  Objective: Vital signs in last 24 hours: Temp:  [98.2 F (36.8 C)-98.4 F (36.9 C)] 98.4 F (36.9 C) (11/10 0543) Pulse Rate:  [91-109] 109 (11/10 0543) Resp:  [20] 20 (11/10 0543) BP: (142-145)/(59-65) 142/65 (11/10 0543) SpO2:  [93 %-100 %] 100 % (11/10 0543) Last BM Date: 08/27/18  Intake/Output from previous day: 11/09 0701 - 11/10 0700 In: 1694.4 [P.O.:240; I.V.:941.2; IV Piggyback:513.2] Out: -  Intake/Output this shift: Total I/O In: 81 [I.V.:31; IV Piggyback:50] Out: -   Physical exam:  NAD, alert Follows commands , oriented to place CN intact, no pronator drift, intact reflexes. No motor or sensory deficits. She is calm Abd: soft, NT, staples in place, no infection. No peritonitis Or infection  Lab Results: CBC  Recent Labs    08/27/18 0425 08/28/18 0443  WBC 5.4 8.7  HGB 8.7* 9.0*  HCT 27.0* 27.4*  PLT 245 230   BMET Recent Labs    08/27/18 0425 08/28/18 0443  NA 139 135  K 3.7 3.4*  CL 107 102  CO2 27 25  GLUCOSE 97 110*  BUN 9 6*  CREATININE 0.80 0.81  CALCIUM 7.7* 7.8*   PT/INR No results for input(s): LABPROT, INR in the last 72 hours. ABG No results for input(s): PHART, HCO3 in the last 72 hours.  Invalid input(s): PCO2, PO2  Studies/Results: Dg Abd Portable 2v  Result Date: 08/27/2018 CLINICAL DATA:  Postoperative ileus.  Colon resection. EXAM: PORTABLE ABDOMEN - 2 VIEW COMPARISON:  08/25/2018 FINDINGS: Moderate amount of gas within small and large bowel, most consistent with ileus pattern. Air/gas in the right abdominal wall is persistent. No visible free intraperitoneal air. IMPRESSION: Persistent ileus pattern. No sign of free air. Air/gas in the right abdominal wall soft tissues is persistent. Electronically Signed   By:  Nelson Chimes M.D.   On: 08/27/2018 08:48    Anti-infectives: Anti-infectives (From admission, onward)   Start     Dose/Rate Route Frequency Ordered Stop   08/22/18 0600  cefoTEtan (CEFOTAN) 2 g in sodium chloride 0.9 % 100 mL IVPB     2 g 200 mL/hr over 30 Minutes Intravenous On call to O.R. 08/21/18 2230 08/22/18 1035      Assessment/Plan:  Delirium, no focal deficits ( suspect related to meds) I have cleaned out the [facility] and DC all narcotics Medical initiative for delirium prevention Mobilize Soft diet Low yield and preclinical probability for surgical complications No indication for head CT or CT a/p Continue to Correct [place], MD, FACS  08/28/2018

## 2018-08-28 NOTE — Progress Notes (Signed)
Per Dr. Birdie Sons for RN to order one time dose of 5 mg IM haldol.

## 2018-08-29 LAB — MAGNESIUM: Magnesium: 1.9 mg/dL (ref 1.7–2.4)

## 2018-08-29 LAB — PHOSPHORUS: PHOSPHORUS: 2.9 mg/dL (ref 2.5–4.6)

## 2018-08-29 LAB — BASIC METABOLIC PANEL
Anion gap: 9 (ref 5–15)
BUN: 7 mg/dL — AB (ref 8–23)
CO2: 26 mmol/L (ref 22–32)
CREATININE: 0.76 mg/dL (ref 0.44–1.00)
Calcium: 8 mg/dL — ABNORMAL LOW (ref 8.9–10.3)
Chloride: 101 mmol/L (ref 98–111)
GFR calc Af Amer: >60 mL/min (ref >60)
GLUCOSE: 111 mg/dL — AB (ref 70–99)
Potassium: 3.5 mmol/L (ref 3.5–5.1)
SODIUM: 136 mmol/L (ref 135–145)

## 2018-08-29 LAB — CBC
HCT: 28 % — ABNORMAL LOW (ref 36.0–46.0)
Hemoglobin: 9.2 g/dL — ABNORMAL LOW (ref 12.0–15.0)
MCH: 31.5 pg (ref 26.0–34.0)
MCHC: 32.9 g/dL (ref 30.0–36.0)
MCV: 95.9 fL (ref 80.0–100.0)
NRBC: 0 % (ref 0.0–0.2)
PLATELETS: 256 10*3/uL (ref 150–400)
RBC: 2.92 MIL/uL — AB (ref 3.87–5.11)
RDW: 11.9 % (ref 11.5–15.5)
WBC: 9.5 10*3/uL (ref 4.0–10.5)

## 2018-08-29 MED ORDER — CELECOXIB 200 MG PO CAPS: 200.0000 mg | ORAL_CAPSULE | Freq: Every day | ORAL | 0 refills | Status: DC | PRN

## 2018-08-29 NOTE — Care Management Important Message (Signed)
Copy of signed IM left with patient in room.  

## 2018-08-29 NOTE — Care Management Note (Signed)
Case Management Note  Patient Details  Name: Cassie Carlson MRN: [id number] Date of Birth: [birth year]   Patient to discharge home today.  Daughter Sharyn Lull at bedside.  Daughter will fly back to [place] on Wednesday.  Joelene Millin with [facility] notified of discharge.  From the hospital only orders for PT and aide were entered, however [facility] has a policy signed from the outpatient office that will include nursing.  RW delivered to room prior to discharge.  Daughter has information on PCS services, and is looking in to [facility]" specifically.  Follow up appointments have been made with patient's friend Altamese Dilling will be able to transport.  Home health to contact friend Altamese Dilling prior to first visit.  Joelene Millin with [facility] notified.  RNCM signing off.    Subjective/Objective:                    Action/Plan:   Expected Discharge Date:  08/29/18               Expected Discharge Plan:  [place]  In-House Referral:     Discharge planning Services  CM Consult  Post Acute Care Choice:  Home Health, Durable Medical Equipment Choice offered to:  Patient, Adult Children  DME Arranged:  Walker rolling DME Agency:  [place] Arranged:  RN, PT, Nurse's Aide [place] Agency:  [facility]  Status of Service:  Completed, signed off  If discussed at [place] of Stay Meetings, dates discussed:    Additional Comments:  Beverly Sessions, RN 08/29/2018, 12:08 PM

## 2018-08-29 NOTE — Discharge Summary (Signed)
Physician Discharge Summary  Patient ID: LASHAWNDRA LAMPKINS MRN: [id number] DOB/AGE: 1945/12/12 72 y.o.  Admit date: 08/22/2018 Discharge date: 08/29/2018  Admission Diagnoses: Unresectable colon polyp  Discharge Diagnoses:  Same as above, postop ileus, altered mental status, medical deconditioning  Discharged Condition: good   Hospital Course: Patient presented for elective transverse colectomy for unresectable colon polyp.  Postoperatively patient had some pain control issues requiring some narcotic use, which likely resulted in some altered mental status and postoperative ileus.  Altered mental status improved over time with PRN medications for anxiety and confusion, and gradual weaning of all narcotics.  Postoperative ileus was treated with NG tube decompression and a period of n.p.o.  At time of discharge, patient was alert and oriented x3, pain controlled, tolerating a soft diet, and having multiple bowel movements throughout the day.  Patient will be discharged home, with home health aide due to generalized deconditioning and PT recommendations for as needed therapy.  Case was discussed in detail with daughter who was in agreement with plan.  Staples will be removed at follow-up appointment in 1 week.  Consults: None  Discharge Exam: Blood pressure 127/60, pulse 92, temperature 98.2 F (36.8 C), temperature source Oral, resp. rate 18, height 5\' 4"  (1.626 m), weight 94.1 kg, SpO2 93 %. General appearance: alert, cooperative and no distress GI: soft, non-tender; bowel sounds normal; no masses,  no organomegaly and staples over incisions c/d/i.  Disposition:  Discharge disposition: 01-Home or Self Care       Discharge Instructions    Discharge patient   Complete by:  As directed    Discharge disposition:  01-Home or Self Care   Discharge patient date:  08/29/2018     Allergies as of 08/29/2018      Reactions   Asa [aspirin] Other (See Comments)   GI distress/irritaion   Codeine Other (See Comments)   "Face turned red like a sunburn"   Ibuprofen Other (See Comments)   GI distress/irritation   Nsaids Other (See Comments)   GI distress/irritation   Other Itching   CHG wipes      Medication List    TAKE these medications   acetaminophen 500 MG tablet Commonly known as:  TYLENOL Take 1,000 mg by mouth every 6 (six) hours as needed for mild pain or headache.   celecoxib 200 MG capsule Commonly known as:  CELEBREX Take 1 capsule (200 mg total) by mouth daily as needed for up to 10 days.   levothyroxine 25 MCG tablet Commonly known as:  SYNTHROID, LEVOTHROID Take 25 mcg by mouth daily before breakfast.   RABEprazole 20 MG tablet Commonly known as:  ACIPHEX Take 20 mg by mouth 2 (two) times daily.   ranitidine 150 MG capsule Commonly known as:  ZANTAC Take 150 mg by mouth 2 (two) times daily.   sucralfate 1 g tablet Commonly known as:  CARAFATE Take 1 g by mouth 2 (two) times daily.   venlafaxine XR 75 MG 24 hr capsule Commonly known as:  EFFEXOR-XR Take 150 mg by mouth daily with breakfast.      Follow-up Information    Esten Dollar, DO Follow up in 1 week(s).   Specialty:  Surgery Contact information: [address] [place] [address] [phone]            Total time spent arranging discharge was >52min. Signed: Benjamine Sprague 08/29/2018, 9:47 AM

## 2018-08-29 NOTE — Progress Notes (Signed)
Physical Therapy Treatment Patient Details Name: Cassie Carlson MRN: [id number] DOB: 1946-04-06 Today's Date: 08/29/2018    History of Present Illness Patient is a 72 year old female admitted following transverse colon colectomy.  PMH includes colon resection 1 mo ago, HA, dementia, depression, arthritis and anxiety.    PT Comments    Pt requiring initial cueing for logrolling technique in/out of bed and also transfer technique with RW use and then pt able to perform on own safely.  Able to ambulate in hallway 280 feet with RW; steady and safe with walker use.  Also navigated 4 stairs with railing use safely with CGA.  Pt appears safe to discharge home with HHPT and intermittent support.    Follow Up Recommendations  Home health PT;Supervision - Intermittent     Equipment Recommendations  Rolling walker with 5" wheels    Recommendations for Other Services       Precautions / Restrictions Precautions Precautions: Fall Precaution Comments: abdominal incision Restrictions Weight Bearing Restrictions: No    Mobility  Bed Mobility Overal bed mobility: Needs Assistance Bed Mobility: Rolling;Sidelying to Sit;Sit to Sidelying Rolling: Supervision Sidelying to sit: Supervision;HOB elevated     Sit to sidelying: Supervision;HOB elevated General bed mobility comments: vc's for logrolling technique initially and then pt able to perform on own; pt reports having adjustable bed at home that she is going to sleep in and keeps HOB elevated (so simulated home set-up during session)  Transfers Overall transfer level: Needs assistance Equipment used: Rolling walker (2 wheeled) Transfers: Sit to/from [facility] Sit to Stand: Supervision Stand pivot transfers: Supervision       General transfer comment: initial vc's for technique and then pt able to perform on own safely without any further cueing  Ambulation/Gait Ambulation/Gait assistance: Min  guard;Supervision Gait Distance (Feet): 280 Feet Assistive device: Rolling walker (2 wheeled) Gait Pattern/deviations: Step-through pattern Gait velocity: decreased   General Gait Details: steady and safe with RW use   Stairs Stairs: Yes Stairs assistance: Min guard Stair Management: One rail Right;Step to pattern;Forwards Number of Stairs: 4 General stair comments: initial vc's for technique and then pt able to perform safely (step down with L LE leading and step up with R LE leading); 2nd assist for safety for stairs   Wheelchair Mobility    Modified Rankin (Stroke Patients Only)       Balance Overall balance assessment: Needs assistance   Sitting balance-Leahy Scale: Normal Sitting balance - Comments: steady sitting reaching outside BOS   Standing balance support: No upper extremity supported Standing balance-Leahy Scale: Good Standing balance comment: steady standing reaching within BOS                            Cognition Arousal/Alertness: Awake/alert Behavior During Therapy: WFL for tasks assessed/performed Overall Cognitive Status: Within Functional Limits for tasks assessed                                        Exercises      General Comments General comments (skin integrity, edema, etc.): abdominal incision intact.  Nursing cleared pt for participation in physical therapy.  Pt agreeable to PT session.  Pt's daughter present during session.  Pt reporting bowel incontinence in brief beginning of session (NT notified and came to assist with clean-up; pt stood with RW safely for clean-up).  Pertinent Vitals/Pain Pain Assessment: No/denies pain Pain Intervention(s): Limited activity within patient's tolerance;Monitored during session;Repositioned  Vitals (HR and O2 on room air) stable and WFL throughout treatment session.    Home Living                      Prior Function            PT Goals (current goals can  now be found in the care plan section) Acute Rehab PT Goals Patient Stated Goal: To return to independent mobility enough to work in her garden. PT Goal Formulation: With patient Time For Goal Achievement: 09/14/18 Potential to Achieve Goals: Good Progress towards PT goals: Progressing toward goals    Frequency    Min 2X/week      PT Plan Current plan remains appropriate    Co-evaluation              AM-PAC PT "6 Clicks" Daily Activity  Outcome Measure  Difficulty turning over in bed (including adjusting bedclothes, sheets and blankets)?: A Little Difficulty moving from lying on back to sitting on the side of the bed? : A Little Difficulty sitting down on and standing up from a chair with arms (e.g., wheelchair, bedside commode, etc,.)?: A Little Help needed moving to and from a bed to chair (including a wheelchair)?: A Little Help needed walking in hospital room?: A Little Help needed climbing 3-5 steps with a railing? : A Little 6 Click Score: 18    End of Session Equipment Utilized During Treatment: Gait belt(up high off of incision) Activity Tolerance: Patient tolerated treatment well Patient left: in bed;with call bell/phone within reach;with bed alarm set;with nursing/sitter in room;with family/visitor present Nurse Communication: Mobility status PT Visit Diagnosis: Unsteadiness on feet (R26.81);Muscle weakness (generalized) (M62.81);Other abnormalities of gait and mobility (R26.89)     Time: 6945-0388 PT Time Calculation (min) (ACUTE ONLY): 38 min  Charges:  $Gait Training: 8-22 mins $Therapeutic Exercise: 8-22 mins $Therapeutic Activity: 8-22 mins                    Leitha Bleak, PT 08/29/18, 11:21 AM [phone]

## 2018-08-29 NOTE — Progress Notes (Signed)
Cassie Carlson  A and O x 4. VSS. Pt tolerating diet well. No complaints of pain or nausea. IV removed intact, prescriptions given. Pt voiced understanding of discharge instructions with no further questions. Pt discharged via wheelchair with NT.    Allergies as of 08/29/2018      Reactions   Asa [aspirin] Other (See Comments)   GI distress/irritaion   Codeine Other (See Comments)   "Face turned red like a sunburn"   Ibuprofen Other (See Comments)   GI distress/irritation   Nsaids Other (See Comments)   GI distress/irritation   Other Itching   CHG wipes      Medication List    TAKE these medications   acetaminophen 500 MG tablet Commonly known as:  TYLENOL Take 1,000 mg by mouth every 6 (six) hours as needed for mild pain or headache.   celecoxib 200 MG capsule Commonly known as:  CELEBREX Take 1 capsule (200 mg total) by mouth daily as needed for up to 10 days.   levothyroxine 25 MCG tablet Commonly known as:  SYNTHROID, LEVOTHROID Take 25 mcg by mouth daily before breakfast.   RABEprazole 20 MG tablet Commonly known as:  ACIPHEX Take 20 mg by mouth 2 (two) times daily.   ranitidine 150 MG capsule Commonly known as:  ZANTAC Take 150 mg by mouth 2 (two) times daily.   sucralfate 1 g tablet Commonly known as:  CARAFATE Take 1 g by mouth 2 (two) times daily.   venlafaxine XR 75 MG 24 hr capsule Commonly known as:  EFFEXOR-XR Take 150 mg by mouth daily with breakfast.            Durable Medical Equipment  (From admission, onward)         Start     Ordered   08/29/18 1050  For home use only DME Walker rolling  Once    Question:  Patient needs a walker to treat with the following condition  Answer:  Weakness   08/29/18 1049          Vitals:   08/29/18 0830 08/29/18 1130  BP: 127/60 131/67  Pulse: 92 86  Resp: 18 16  Temp: 98.2 F (36.8 C) 98.2 F (36.8 C)  SpO2: 93% 99%    Francesco Sor

## 2018-08-29 NOTE — Progress Notes (Signed)
Per MD okay for RN to place shower order.

## 2018-09-03 ENCOUNTER — Other Ambulatory Visit: Payer: Self-pay

## 2018-09-03 ENCOUNTER — Encounter
Admission: EM | Disposition: A | Discharge status: Skilled Nursing Facility | Payer: Self-pay | Source: Home / Self Care | Attending: Surgery

## 2018-09-03 ENCOUNTER — Inpatient Hospital Stay
Admission: EM | Admit: 2018-09-03 | Discharge: 2018-09-21 | DRG: 856 | Disposition: A | Discharge status: Skilled Nursing Facility | Payer: Medicare Other | Attending: Surgery | Admitting: Surgery

## 2018-09-03 ENCOUNTER — Emergency Department: Payer: Medicare Other

## 2018-09-03 ENCOUNTER — Emergency Department: Payer: Medicare Other | Admitting: Anesthesiology

## 2018-09-03 DIAGNOSIS — F0390 Unspecified dementia without behavioral disturbance: Secondary | ICD-10-CM | POA: Diagnosis present

## 2018-09-03 DIAGNOSIS — T8149XA Infection following a procedure, other surgical site, initial encounter: Secondary | ICD-10-CM | POA: Diagnosis present

## 2018-09-03 DIAGNOSIS — Z886 Allergy status to analgesic agent status: Secondary | ICD-10-CM

## 2018-09-03 DIAGNOSIS — T8141XA Infection following a procedure, superficial incisional surgical site, initial encounter: Principal | ICD-10-CM | POA: Diagnosis present

## 2018-09-03 DIAGNOSIS — F064 Anxiety disorder due to known physiological condition: Secondary | ICD-10-CM | POA: Diagnosis present

## 2018-09-03 DIAGNOSIS — K219 Gastro-esophageal reflux disease without esophagitis: Secondary | ICD-10-CM | POA: Diagnosis present

## 2018-09-03 DIAGNOSIS — Z6832 Body mass index (BMI) 32.0-32.9, adult: Secondary | ICD-10-CM

## 2018-09-03 DIAGNOSIS — T8130XA Disruption of wound, unspecified, initial encounter: Secondary | ICD-10-CM

## 2018-09-03 DIAGNOSIS — G4730 Sleep apnea, unspecified: Secondary | ICD-10-CM | POA: Diagnosis present

## 2018-09-03 DIAGNOSIS — T8131XA Disruption of external operation (surgical) wound, not elsewhere classified, initial encounter: Secondary | ICD-10-CM | POA: Diagnosis present

## 2018-09-03 DIAGNOSIS — F329 Major depressive disorder, single episode, unspecified: Secondary | ICD-10-CM | POA: Diagnosis present

## 2018-09-03 DIAGNOSIS — Z981 Arthrodesis status: Secondary | ICD-10-CM

## 2018-09-03 DIAGNOSIS — T8143XA Infection following a procedure, organ and space surgical site, initial encounter: Secondary | ICD-10-CM

## 2018-09-03 DIAGNOSIS — K9189 Other postprocedural complications and disorders of digestive system: Secondary | ICD-10-CM

## 2018-09-03 DIAGNOSIS — Z79899 Other long term (current) drug therapy: Secondary | ICD-10-CM

## 2018-09-03 DIAGNOSIS — Y838 Other surgical procedures as the cause of abnormal reaction of the patient, or of later complication, without mention of misadventure at the time of the procedure: Secondary | ICD-10-CM | POA: Diagnosis present

## 2018-09-03 DIAGNOSIS — Z9049 Acquired absence of other specified parts of digestive tract: Secondary | ICD-10-CM

## 2018-09-03 DIAGNOSIS — E039 Hypothyroidism, unspecified: Secondary | ICD-10-CM | POA: Diagnosis present

## 2018-09-03 DIAGNOSIS — Z885 Allergy status to narcotic agent status: Secondary | ICD-10-CM

## 2018-09-03 DIAGNOSIS — D62 Acute posthemorrhagic anemia: Secondary | ICD-10-CM | POA: Diagnosis present

## 2018-09-03 DIAGNOSIS — K651 Peritoneal abscess: Secondary | ICD-10-CM | POA: Diagnosis present

## 2018-09-03 DIAGNOSIS — Z87891 Personal history of nicotine dependence: Secondary | ICD-10-CM

## 2018-09-03 DIAGNOSIS — G8918 Other acute postprocedural pain: Secondary | ICD-10-CM

## 2018-09-03 DIAGNOSIS — I96 Gangrene, not elsewhere classified: Secondary | ICD-10-CM | POA: Diagnosis present

## 2018-09-03 DIAGNOSIS — Z791 Long term (current) use of non-steroidal anti-inflammatories (NSAID): Secondary | ICD-10-CM

## 2018-09-03 DIAGNOSIS — E669 Obesity, unspecified: Secondary | ICD-10-CM | POA: Diagnosis present

## 2018-09-03 HISTORY — PX: LAPAROTOMY: SHX154

## 2018-09-03 LAB — CBC WITH DIFFERENTIAL/PLATELET
ABS IMMATURE GRANULOCYTES: 0.18 10*3/uL — AB (ref 0.00–0.07)
Basophils Absolute: 0 10*3/uL (ref 0.0–0.1)
Basophils Relative: 0 %
Eosinophils Absolute: 0 10*3/uL (ref 0.0–0.5)
Eosinophils Relative: 0 %
HCT: 26.6 % — ABNORMAL LOW (ref 36.0–46.0)
HEMOGLOBIN: 8.7 g/dL — AB (ref 12.0–15.0)
IMMATURE GRANULOCYTES: 1 %
LYMPHS PCT: 7 %
Lymphs Abs: 1.1 10*3/uL (ref 0.7–4.0)
MCH: 30.7 pg (ref 26.0–34.0)
MCHC: 32.7 g/dL (ref 30.0–36.0)
MCV: 94 fL (ref 80.0–100.0)
MONO ABS: 1.1 10*3/uL — AB (ref 0.1–1.0)
Monocytes Relative: 8 %
NEUTROS ABS: 12.2 10*3/uL — AB (ref 1.7–7.7)
Neutrophils Relative %: 84 %
Platelets: 429 10*3/uL — ABNORMAL HIGH (ref 150–400)
RBC: 2.83 MIL/uL — AB (ref 3.87–5.11)
RDW: 12.3 % (ref 11.5–15.5)
WBC: 14.6 10*3/uL — AB (ref 4.0–10.5)
nRBC: 0 % (ref 0.0–0.2)

## 2018-09-03 LAB — COMPREHENSIVE METABOLIC PANEL
ALK PHOS: 48 U/L (ref 38–126)
ALT: 16 U/L (ref 0–44)
AST: 29 U/L (ref 15–41)
Albumin: 2.3 g/dL — ABNORMAL LOW (ref 3.5–5.0)
Anion gap: 8 (ref 5–15)
BUN: 9 mg/dL (ref 8–23)
CALCIUM: 7.6 mg/dL — AB (ref 8.9–10.3)
CO2: 28 mmol/L (ref 22–32)
CREATININE: 0.71 mg/dL (ref 0.44–1.00)
Chloride: 100 mmol/L (ref 98–111)
GFR calc non Af Amer: >60 mL/min (ref >60)
GLUCOSE: 136 mg/dL — AB (ref 70–99)
Potassium: 2.7 mmol/L — CL (ref 3.5–5.1)
SODIUM: 136 mmol/L (ref 135–145)
Total Bilirubin: 0.8 mg/dL (ref 0.3–1.2)
Total Protein: 5.4 g/dL — ABNORMAL LOW (ref 6.5–8.1)

## 2018-09-03 LAB — TROPONIN I: Troponin I: <0.03 ng/mL (ref <0.03)

## 2018-09-03 LAB — LACTIC ACID, PLASMA: Lactic Acid, Venous: 1.2 mmol/L (ref 0.5–1.9)

## 2018-09-03 LAB — LIPASE, BLOOD: Lipase: 34 U/L (ref 11–51)

## 2018-09-03 SURGERY — LAPAROTOMY, EXPLORATORY
Anesthesia: General | Site: Abdomen

## 2018-09-03 MED ORDER — IOHEXOL 300 MG/ML  SOLN
100.0000 mL | Freq: Once | INTRAMUSCULAR | Status: AC | PRN
  Administered 2018-09-03: 100 mL via INTRAVENOUS

## 2018-09-03 MED ORDER — PIPERACILLIN-TAZOBACTAM 3.375 G IVPB 30 MIN
3.3750 g | Freq: Once | INTRAVENOUS | Status: AC
  Administered 2018-09-03: 3.375 g via INTRAVENOUS
  Filled 2018-09-03: qty 50

## 2018-09-03 MED ORDER — SODIUM CHLORIDE 0.9 % IV SOLN
Freq: Once | INTRAVENOUS | Status: AC
  Administered 2018-09-03: 18:00:00 via INTRAVENOUS

## 2018-09-03 MED ORDER — KCL IN DEXTROSE-NACL 40-5-0.45 MEQ/L-%-% IV SOLN
INTRAVENOUS | Status: DC
  Administered 2018-09-03: 20:00:00 via INTRAVENOUS
  Filled 2018-09-03 (×5): qty 1000

## 2018-09-03 MED ORDER — VANCOMYCIN HCL IN DEXTROSE 1-5 GM/200ML-% IV SOLN
1000.0000 mg | Freq: Once | INTRAVENOUS | Status: AC
  Administered 2018-09-03: 1000 mg via INTRAVENOUS
  Filled 2018-09-03: qty 200

## 2018-09-03 MED ORDER — PROPOFOL 10 MG/ML IV BOLUS
INTRAVENOUS | Status: AC
  Filled 2018-09-03: qty 20

## 2018-09-03 MED ORDER — SUCCINYLCHOLINE CHLORIDE 20 MG/ML IJ SOLN
INTRAMUSCULAR | Status: AC
  Filled 2018-09-03: qty 1

## 2018-09-03 MED ORDER — FENTANYL CITRATE (PF) 100 MCG/2ML IJ SOLN
INTRAMUSCULAR | Status: AC
  Filled 2018-09-03: qty 2

## 2018-09-03 MED ORDER — ROCURONIUM BROMIDE 50 MG/5ML IV SOLN
INTRAVENOUS | Status: AC
  Filled 2018-09-03: qty 1

## 2018-09-03 MED ORDER — LIDOCAINE HCL (PF) 2 % IJ SOLN
INTRAMUSCULAR | Status: AC
  Filled 2018-09-03: qty 10

## 2018-09-03 SURGICAL SUPPLY — 39 items
BULB RESERV EVAC DRAIN JP 100C (MISCELLANEOUS) ×2 IMPLANT
CANISTER SUCT 1200ML W/VALVE (MISCELLANEOUS) ×7 IMPLANT
CHLORAPREP W/TINT 26ML (MISCELLANEOUS) ×1 IMPLANT
COVER WAND RF STERILE (DRAPES) IMPLANT
DRAIN CHANNEL 19F RND (DRAIN) ×3 IMPLANT
DRAPE LAPAROTOMY 100X77 ABD (DRAPES) ×3 IMPLANT
DRSG OPSITE POSTOP 4X10 (GAUZE/BANDAGES/DRESSINGS) ×1 IMPLANT
DRSG OPSITE POSTOP 4X8 (GAUZE/BANDAGES/DRESSINGS) ×1 IMPLANT
DRSG TEGADERM 4X10 (GAUZE/BANDAGES/DRESSINGS) ×1 IMPLANT
DRSG TELFA 3X8 NADH (GAUZE/BANDAGES/DRESSINGS) IMPLANT
ELECT REM PT RETURN 9FT ADLT (ELECTROSURGICAL) ×3
ELECTRODE REM PT RTRN 9FT ADLT (ELECTROSURGICAL) ×1 IMPLANT
GLOVE BIO SURGEON STRL SZ7 (GLOVE) ×8 IMPLANT
GLOVE BIOGEL PI IND STRL 7.0 (GLOVE) ×1 IMPLANT
GLOVE BIOGEL PI INDICATOR 7.0 (GLOVE) ×6
GLOVE SURG SYN 7.0 (GLOVE) ×3 IMPLANT
GLOVE SURG SYN 7.0 PF PI (GLOVE) ×1 IMPLANT
GOWN STRL REUS W/ TWL LRG LVL3 (GOWN DISPOSABLE) ×2 IMPLANT
GOWN STRL REUS W/TWL LRG LVL3 (GOWN DISPOSABLE) ×9
HEMOSTAT ARISTA ABSORB 3G PWDR (MISCELLANEOUS) ×2 IMPLANT
KIT TURNOVER KIT A (KITS) ×3 IMPLANT
LABEL OR SOLS (LABEL) ×3 IMPLANT
MESH PHASIX ST 10X15 (Mesh General) ×2 IMPLANT
NS IRRIG 1000ML POUR BTL (IV SOLUTION) ×5 IMPLANT
PACK BASIN MAJOR ARMC (MISCELLANEOUS) ×3 IMPLANT
PACK COLON CLEAN CLOSURE (MISCELLANEOUS) ×1 IMPLANT
PAD DRESSING TELFA 3X8 NADH (GAUZE/BANDAGES/DRESSINGS) ×1 IMPLANT
SPONGE LAP 18X18 RF (DISPOSABLE) ×3 IMPLANT
SUT ETHILON 3-0 FS-10 30 BLK (SUTURE) ×3
SUT PDS AB 1 TP1 54 (SUTURE) IMPLANT
SUT SILK 2 0 (SUTURE) ×3
SUT SILK 2-0 18XBRD TIE 12 (SUTURE) ×1 IMPLANT
SUT SILK 3 0 (SUTURE)
SUT SILK 3-0 18XBRD TIE 12 (SUTURE) ×1 IMPLANT
SUT VIC AB 3-0 SH 27 (SUTURE) ×3
SUT VIC AB 3-0 SH 27X BRD (SUTURE) ×2 IMPLANT
SUT VICRYL 0 AB UR-6 (SUTURE) ×6 IMPLANT
SUTURE EHLN 3-0 FS-10 30 BLK (SUTURE) ×1 IMPLANT
TRAY FOLEY MTR SLVR 16FR STAT (SET/KITS/TRAYS/PACK) ×3 IMPLANT

## 2018-09-03 NOTE — ED Notes (Signed)
Patient transported to CT 

## 2018-09-03 NOTE — H&P (Signed)
Subjective:   CC: wound infection and dehiscence  HPI:  Cassie Carlson is a 72 y.o. female who was consulted by [facility] for evaluation of  above.  Hx of open transverse colectomy for unresectable colon polyp on 08/23/18.  First noted redness around incision site  2 days ago.  Swelling started a day ago.  Minimal discomfort but increased distention and fever noted at ED.  CT scan revealed large air collection and wound dehiscence.    Past Medical History:  has a past medical history of Anxiety, Arthritis, Dementia ([place]), Depression, Family history of adverse reaction to anesthesia, Family history of breast cancer, Family history of colon cancer, Family history of colon cancer, Family history of prostate cancer, Family history of prostate cancer, Fibroids, GERD (gastroesophageal reflux disease), Headache, Hypothyroidism, PONV (postoperative nausea and vomiting), and Sleep apnea.  Past Surgical History:  has a past surgical history that includes Abdominal hysterectomy; Vein ligation and stripping; Breast surgery; Cholecystectomy; EUS (08/18/2012); Dilation and curettage of uterus; Anterior cervical decomp/discectomy fusion (N/A, 01/01/2015); Augmentation mammaplasty (Bilateral, 1990); Reduction mammaplasty (Bilateral, 1990); Esophagogastroduodenoscopy (egd) with propofol (N/A, 06/14/2018); Colonoscopy with propofol (N/A, 06/14/2018); Shoulder arthroscopy (Left); and Colon resection (N/A, 08/22/2018).  Family History: family history includes Bladder Cancer in her father; Brain cancer in her brother; Breast cancer (age of onset: 69) in her mother; Colon cancer (age of onset: 77) in her father; Leukemia in her paternal aunt; Lung cancer in her brother, mother, and sister; Lymphoma (age of onset: 60) in her other; Prostate cancer in her father; Prostate cancer (age of onset: 79) in her paternal grandfather; Throat cancer in her sister.  Social History:  reports that she quit smoking about 52 years ago. Her smoking  use included cigarettes. She has a 10.00 pack-year smoking history. She has never used smokeless tobacco. She reports that she does not drink alcohol or use drugs.  Current Medications:  acetaminophen (TYLENOL) 500 MG tablet Take 1,000 mg by mouth every 6 (six) hours as needed for mild pain or headache.  [provider] Needs Review  celecoxib (CELEBREX) 200 MG capsule Take 1 capsule (200 mg total) by mouth daily as needed for up to 10 days. Lysle Pearl, Shanekqua Schaper, DO Needs Review  levothyroxine (SYNTHROID, LEVOTHROID) 25 MCG tablet Take 25 mcg by mouth daily before breakfast. [provider] Needs Review  RABEprazole (ACIPHEX) 20 MG tablet Take 20 mg by mouth 2 (two) times daily. [provider] Needs Review  ranitidine (ZANTAC) 150 MG capsule Take 150 mg by mouth 2 (two) times daily.  [provider] Needs Review  sucralfate (CARAFATE) 1 g tablet Take 1 g by mouth 2 (two) times daily. [provider] Needs Review  venlafaxine XR (EFFEXOR-XR) 75 MG 24 hr capsule Take 150 mg by mouth daily with breakfast.  [provider] Needs Review     Allergies:  Allergies  Allergen Reactions  . Asa [Aspirin] Other (See Comments)    GI distress/irritaion  . Codeine Other (See Comments)    "Face turned red like a sunburn"  . Ibuprofen Other (See Comments)    GI distress/irritation  . Nsaids Other (See Comments)    GI distress/irritation  . Other Itching    CHG wipes    ROS:  A 15 point review of systems was performed and pertinent positives and negatives noted in HPI   Objective:     BP (!) 110/53   Pulse (!) 112   Temp (!) 103 F (39.4 C) (Rectal)  Resp 20   Ht 5' 3.5" (1.613 m)   Wt 81.6 kg   SpO2 93%   BMI 31.39 kg/m   Constitutional :  alert, cooperative, appears stated age and no distress  Lymphatics/Throat:  no asymmetry, masses, or scars  Respiratory:  clear to auscultation bilaterally  Cardiovascular:  regular rate and rhythm   Gastrointestinal: erythema with severe swelling along midline incision, where skin and staple are intact, consistent with wound infection.  Musculoskeletal: Steady gait and movement  Skin: Cool and moist  Psychiatric: Normal affect, non-agitated, not confused       LABS:  CMP Latest Ref Rng & Units 09/03/2018 08/29/2018 08/28/2018  Glucose 70 - 99 mg/dL 136(H) 111(H) 110(H)  BUN 8 - 23 mg/dL 9 7(L) 6(L)  Creatinine 0.44 - 1.00 mg/dL 0.71 0.76 0.81  Sodium 135 - 145 mmol/L 136 136 135  Potassium 3.5 - 5.1 mmol/L 2.7(LL) 3.5 3.4(L)  Chloride 98 - 111 mmol/L 100 101 102  CO2 22 - 32 mmol/L 28 26 25   Calcium 8.9 - 10.3 mg/dL 7.6(L) 8.0(L) 7.8(L)  Total Protein 6.5 - 8.1 g/dL 5.4(L) - -  Total Bilirubin 0.3 - 1.2 mg/dL 0.8 - -  Alkaline Phos 38 - 126 U/L 48 - -  AST 15 - 41 U/L 29 - -  ALT 0 - 44 U/L 16 - -   CBC Latest Ref Rng & Units 09/03/2018 08/29/2018 08/28/2018  WBC 4.0 - 10.5 K/uL 14.6(H) 9.5 8.7  Hemoglobin 12.0 - 15.0 g/dL 8.7(L) 9.2(L) 9.0(L)  Hematocrit 36.0 - 46.0 % 26.6(L) 28.0(L) 27.4(L)  Platelets 150 - 400 K/uL 429(H) 256 230    RADS: CLINICAL DATA:  Abdominal distention. Increasing abdominal skin redness. Status post laparoscopic colon resection on 08/22/2018.  EXAM: CT ABDOMEN AND PELVIS WITH CONTRAST  TECHNIQUE: Multidetector CT imaging of the abdomen and pelvis was performed using the standard protocol following bolus administration of intravenous contrast.  CONTRAST:  140mL OMNIPAQUE IOHEXOL 300 MG/ML  SOLN  COMPARISON:  Postoperative abdomen pelvis radiographs dated 08/27/2018. Abdomen and pelvis CT dated 04/14/2018.  FINDINGS: Lower chest: Bilateral breast implants. Associated capsular calcifications on the left. Small bilateral pleural effusions. Mild bilateral lower lobe atelectasis.  Hepatobiliary: Mild diffuse low density of the liver. Cholecystectomy clips.  Pancreas: Unremarkable. No pancreatic ductal dilatation or surrounding  inflammatory changes.  Spleen: Stable small cleft in the periphery of the spleen. The spleen is normal in size.  Adrenals/Urinary Tract: Normal appearing adrenal glands. Small left renal cyst. Normal appearing right kidney, ureters and urinary bladder.  Stomach/Bowel: Transverse colon anastomosis. Adjacent surgical clips inferiorly. Multiple sigmoid colon diverticula. Unremarkable stomach, small bowel and appendix.  Vascular/Lymphatic: Mild atheromatous arterial calcifications. No enlarged lymph nodes.  Reproductive: Prostate is unremarkable.  Other: Anterior to the transverse colon anastomosis and adjacent surgical clips, there is a large predominantly gas-filled cavity extending through an anterior abdominal wall defect into the subcutaneous fat with overlying skin clips. This extends to the skin clips and measures 12 x 10 cm in maximum dimensions in the axial plane and 14.4 cm in length in the sagittal plane. This contains a small amount of dependent fluid and debris. There is mild soft tissue stranding in the adjacent subcutaneous fat. There is also a small amount of postoperative air superficial to the right lateral abdominal wall musculature. There are also multiple small gas or air bubbles inferior to the gas and fluid collection on the left. The anterior abdominal wall defect measures 11.5 cm in length and  5.5 cm in width.  Musculoskeletal: L5-S1 degenerative changes and diffuse disc protrusion. Minimal lower thoracic spine degenerative changes.  IMPRESSION: 1. 14.4 x 12 x 10 cm predominantly gas-filled cavity extending through a large anterior abdominal wall defect into the subcutaneous fat, as described above. This is compatible with a postoperative abscess with some dependent fluid and debris and adjacent gas locules. 2. Small bilateral pleural effusions. 3. Mild bilateral lower lobe atelectasis. 4. Mild diffuse hepatic steatosis. 5. Sigmoid  diverticulosis.   Electronically Signed   By: Claudie Revering M.D.   On: 09/03/2018 19:08   Assessment:     wound infection and dehiscence   Plan:     1.Will proceed with emergent washout, closure.  If abscess is from issue within abdominal cavity, will need to address as needed, including possible ostomy.  Discussed likely wound vac placement as well. Alternatives include continued observation, but likely worsening health and even death without surgical intervention.  Benefits include prevention of such. Discussed the risk of surgery including recurrence, chronic pain, post-op infxn, poor cosmesis, incisional hernia foramtion, poor/delayed wound healing, and possible re-operation to address said risks. The risks of general anesthetic, if used, includes MI, CVA, sudden death or even reaction to anesthetic medications also discussed.  Typical post-op recovery time of 3-5 days with activity restrictions were also discussed. The patient verbalized understanding and all questions were answered to the patient's and daughter's  satisfaction.

## 2018-09-03 NOTE — ED Provider Notes (Addendum)
[facility] Emergency Department Provider Note       Time seen: ----------------------------------------- 5:55 PM on 09/03/2018 -----------------------------------------   I have reviewed the triage vital signs and the nursing notes.  HISTORY   Chief Complaint Post-op Problem    HPI Cassie Carlson is a 72 y.o. female with a history of anxiety, arthritis, depression, GERD, hypothyroidism, polyposis of the colon with recent partial colectomy who presents to the ED for postoperative complication.  Patient had colon resection on November 4.  She states that she has had increasing redness and swelling to the abdomen.  She denies any significant pain at this time, was noted to have a temperature of 103 with tachycardia on arrival concerning for postoperative infection, sepsis or abscess.  Past Medical History:  Diagnosis Date  . Anxiety   . Arthritis   . Dementia ([place])   . Depression   . Family history of adverse reaction to anesthesia    "siister had issues with heart racing during lung biopsy"  . Family history of breast cancer   . Family history of colon cancer   . Family history of colon cancer   . Family history of prostate cancer   . Family history of prostate cancer   . Fibroids    excessive vaginal bleeding  . GERD (gastroesophageal reflux disease)   . Headache   . Hypothyroidism   . PONV (postoperative nausea and vomiting)   . Sleep apnea     Patient Active Problem List   Diagnosis Date Noted  . Colon cancer ([place]) 08/22/2018  . Genetic testing 07/29/2018  . Polyposis of colon 07/20/2018  . Family history of breast cancer   . Family history of colon cancer   . Family history of prostate cancer   . Cervical radiculopathy 01/01/2015  . Nonspecific (abnormal) findings on radiological and other examination of gastrointestinal tract 08/18/2012    Past Surgical History:  Procedure Laterality Date  . ABDOMINAL HYSTERECTOMY    . ANTERIOR  CERVICAL DECOMP/DISCECTOMY FUSION N/A 01/01/2015   Procedure: ANTERIOR CERVICAL DECOMPRESSION/DISCECTOMY FUSION CERVICAL FIVE-SIX,CERVICAL SIX-SEVEN;  Surgeon: Karie Chimera, MD;  Location: [place] [facility];  Service: Neurosurgery;  Laterality: N/A;  . AUGMENTATION MAMMAPLASTY Bilateral 1990  . BREAST SURGERY    . CHOLECYSTECTOMY    . COLON RESECTION N/A 08/22/2018   Procedure: COLON RESECTION LAPAROSCOPIC;  Surgeon: Benjamine Sprague, DO;  Location: [facility] ORS;  Service: General;  Laterality: N/A;  . COLONOSCOPY WITH PROPOFOL N/A 06/14/2018   Procedure: COLONOSCOPY WITH PROPOFOL;  Surgeon: Lollie Sails, MD;  Location: [facility] ENDOSCOPY;  Service: Endoscopy;  Laterality: N/A;  . DILATION AND CURETTAGE OF UTERUS    . ESOPHAGOGASTRODUODENOSCOPY (EGD) WITH PROPOFOL N/A 06/14/2018   Procedure: ESOPHAGOGASTRODUODENOSCOPY (EGD) WITH PROPOFOL;  Surgeon: Lollie Sails, MD;  Location: [facility] ENDOSCOPY;  Service: Endoscopy;  Laterality: N/A;  . EUS  08/18/2012   Procedure: UPPER ENDOSCOPIC ULTRASOUND (EUS) LINEAR;  Surgeon: Milus Banister, MD;  Location: [facility] ENDOSCOPY;  Service: Endoscopy;  Laterality: N/A;  . REDUCTION MAMMAPLASTY Bilateral 1990  . SHOULDER ARTHROSCOPY Left   . VEIN LIGATION AND STRIPPING      Allergies Asa [aspirin]; Codeine; Ibuprofen; Nsaids; and Other  Social History Social History   Tobacco Use  . Smoking status: Former Smoker    Packs/day: 2.00    Years: 5.00    Pack years: 10.00    Types: Cigarettes    Last attempt to quit: 10/19/1965    Years since quitting: 52.9  .  Smokeless tobacco: Never Used  Substance Use Topics  . Alcohol use: No  . Drug use: Never   Review of Systems Constitutional: Positive for fever Cardiovascular: Negative for chest pain. Respiratory: Negative for shortness of breath. Gastrointestinal: Positive for abdominal swelling Musculoskeletal: Negative for back pain. Skin: Positive for skin erythema Neurological: Negative for headaches, focal  weakness or numbness.  All systems negative/normal/unremarkable except as stated in the HPI  ____________________________________________   PHYSICAL EXAM:  VITAL SIGNS: ED Triage Vitals  Enc Vitals Group     BP 09/03/18 1736 (!) 131/112     Pulse Rate 09/03/18 1736 (!) 121     Resp 09/03/18 1736 20     Temp 09/03/18 1736 (!) 103 F (39.4 C)     Temp Source 09/03/18 1736 Rectal     SpO2 09/03/18 1736 94 %     Weight 09/03/18 1735 180 lb (81.6 kg)     Height 09/03/18 1735 5' 3.5" (1.613 m)     Head Circumference --      Peak Flow --      Pain Score --      Pain Loc --      Pain Edu? --      Excl. in [place]? --    Constitutional: She appears dehydrated and weak, no distress ENT   Head: Normocephalic and atraumatic.   Nose: No congestion/rhinnorhea.   Mouth/Throat: Mucous membranes are moist.   Neck: No stridor. Cardiovascular: Normal rate, regular rhythm. No murmurs, rubs, or gallops. Respiratory: Normal respiratory effort without tachypnea nor retractions. Breath sounds are clear and equal bilaterally. No wheezes/rales/rhonchi. Gastrointestinal: Distended abdomen with laparotomy staples in place.  The upper portion of the abdomen and surgical site appears distended with tenderness.  There are hypoactive bowel sounds.  Extensive erythema around the upper aspect of the laparotomy incision Musculoskeletal: Nontender with normal range of motion in extremities. No lower extremity tenderness nor edema. Neurologic:  Normal speech and language. No gross focal neurologic deficits are appreciated.  Skin: Skin erythema in the upper abdomen Psychiatric: Mood and affect are normal. Speech and behavior are normal.  ___________________________________________  ED COURSE:  As part of my medical decision making, I reviewed the following data within the [place] History obtained from family if available, nursing notes, old chart and ekg, as well as notes from prior  ED visits. Patient presented for postoperative infection, we will assess with labs and imaging as indicated at this time.   Procedures ____________________________________________   LABS (pertinent positives/negatives)  Labs Reviewed  CBC WITH DIFFERENTIAL/PLATELET - Abnormal; Notable for the following components:      Result Value   WBC 14.6 (*)    RBC 2.83 (*)    Hemoglobin 8.7 (*)    HCT 26.6 (*)    Platelets 429 (*)    Neutro Abs 12.2 (*)    Monocytes Absolute 1.1 (*)    Abs Immature Granulocytes 0.18 (*)    All other components within normal limits  COMPREHENSIVE METABOLIC PANEL - Abnormal; Notable for the following components:   Potassium 2.7 (*)    Glucose, Bld 136 (*)    Calcium 7.6 (*)    Total Protein 5.4 (*)    Albumin 2.3 (*)    All other components within normal limits  CULTURE, BLOOD (ROUTINE X 2)  CULTURE, BLOOD (ROUTINE X 2)  LIPASE, BLOOD  TROPONIN I  LACTIC ACID, PLASMA  URINALYSIS, COMPLETE (UACMP) WITH MICROSCOPIC    RADIOLOGY Images were  viewed by me  CT the abdomen pelvis with contrast IMPRESSION: 1. 14.4 x 12 x 10 cm predominantly gas-filled cavity extending through a large anterior abdominal wall defect into the subcutaneous fat, as described above. This is compatible with a postoperative abscess with some dependent fluid and debris and adjacent gas locules. 2. Small bilateral pleural effusions. 3. Mild bilateral lower lobe atelectasis. 4. Mild diffuse hepatic steatosis. 5. Sigmoid diverticulosis. ____________________________________________  CRITICAL CARE Performed by: Laurence Aly   Total critical care time: 30 minutes  Critical care time was exclusive of separately billable procedures and treating other patients.  Critical care was necessary to treat or prevent imminent or life-threatening deterioration.  Critical care was time spent personally by me on the following activities: development of treatment plan with patient  and/or surrogate as well as nursing, discussions with consultants, evaluation of patient's response to treatment, examination of patient, obtaining history from patient or surrogate, ordering and performing treatments and interventions, ordering and review of laboratory studies, ordering and review of radiographic studies, pulse oximetry and re-evaluation of patient's condition.   DIFFERENTIAL DIAGNOSIS   Postoperative infection, cellulitis, abscess, sepsis  FINAL ASSESSMENT AND PLAN  Postoperative abscess   Plan: The patient had presented for abdominal distention with erythema as well as fever. Patient's labs did indicate significant leukocytosis and hypokalemia but no lactic acidosis.  Nonetheless due to the extent of her wound she was given vancomycin and Zosyn and we obtained cultures patient's imaging really large intra-abdominal gas-filled area consistent with abscess.  I have discussed with general surgery who will evaluate the patient at the bedside.   Laurence Aly, MD   Note: This note was generated in part or whole with voice recognition software. Voice recognition is usually quite accurate but there are transcription errors that can and very often do occur. I apologize for any typographical errors that were not detected and corrected.     Earleen Newport, MD 09/03/18 [id number]    Earleen Newport, MD 09/03/18 [id number]

## 2018-09-03 NOTE — ED Triage Notes (Signed)
Patient arrives with c/o post op problem. Patient had colon resection. Home health called EMS today for increasing redness to abdomen. Patient denies pain at this time. axox4  HR: 98-105 BP 129/78

## 2018-09-03 NOTE — Anesthesia Preprocedure Evaluation (Signed)
Anesthesia Evaluation  Patient identified by MRN, date of birth, ID band Patient awake    Reviewed: Allergy & Precautions, H&P , NPO status , Patient's Chart, lab work & pertinent test results, reviewed documented beta blocker date and time   History of Anesthesia Complications (+) PONV, Family history of anesthesia reaction and history of anesthetic complications  Airway Mallampati: III  TM Distance: >3 FB Neck ROM: full    Dental  (+) Teeth Intact   Pulmonary sleep apnea , former smoker,    Pulmonary exam normal        Cardiovascular Exercise Tolerance: Good negative cardio ROS Normal cardiovascular exam Rhythm:regular Rate:Normal     Neuro/Psych  Headaches, PSYCHIATRIC DISORDERS Anxiety Depression Dementia  Neuromuscular disease    GI/Hepatic Neg liver ROS, GERD  Medicated,  Endo/Other  Hypothyroidism   Renal/GU negative Renal ROS  negative genitourinary   Musculoskeletal   Abdominal   Peds  Hematology negative hematology ROS (+)   Anesthesia Other Findings Past Medical History: No date: Anxiety No date: Arthritis No date: Dementia ([place]) No date: Depression No date: Family history of adverse reaction to anesthesia     Comment:  "siister had issues with heart racing during lung               biopsy" No date: Family history of breast cancer No date: Family history of colon cancer No date: Family history of colon cancer No date: Family history of prostate cancer No date: Family history of prostate cancer No date: Fibroids     Comment:  excessive vaginal bleeding No date: GERD (gastroesophageal reflux disease) No date: Headache No date: Hypothyroidism No date: PONV (postoperative nausea and vomiting) No date: Sleep apnea Past Surgical History: No date: ABDOMINAL HYSTERECTOMY 01/01/2015: ANTERIOR CERVICAL DECOMP/DISCECTOMY FUSION; N/A     Comment:  Procedure: ANTERIOR CERVICAL DECOMPRESSION/DISCECTOMY               FUSION CERVICAL FIVE-SIX,CERVICAL SIX-SEVEN;  Surgeon:               Karie Chimera, MD;  Location: [facility] ORS;  Service:               Neurosurgery;  Laterality: N/A; 1990: AUGMENTATION MAMMAPLASTY; Bilateral No date: BREAST SURGERY No date: CHOLECYSTECTOMY 08/22/2018: COLON RESECTION; N/A     Comment:  Procedure: COLON RESECTION LAPAROSCOPIC;  Surgeon:               Benjamine Sprague, DO;  Location: [facility] ORS;  Service: General;              Laterality: N/A; 06/14/2018: COLONOSCOPY WITH PROPOFOL; N/A     Comment:  Procedure: COLONOSCOPY WITH PROPOFOL;  Surgeon:               Lollie Sails, MD;  Location: [facility] ENDOSCOPY;                Service: Endoscopy;  Laterality: N/A; No date: DILATION AND CURETTAGE OF UTERUS 06/14/2018: ESOPHAGOGASTRODUODENOSCOPY (EGD) WITH PROPOFOL; N/A     Comment:  Procedure: ESOPHAGOGASTRODUODENOSCOPY (EGD) WITH               PROPOFOL;  Surgeon: Lollie Sails, MD;  Location:               [facility] ENDOSCOPY;  Service: Endoscopy;  Laterality: N/A; 08/18/2012: EUS     Comment:  Procedure: UPPER ENDOSCOPIC ULTRASOUND (EUS) LINEAR;  Surgeon: Milus Banister, MD;  Location: [facility]OS[facility];                Service: Endoscopy;  Laterality: N/A; 1990: REDUCTION MAMMAPLASTY; Bilateral No date: SHOULDER ARTHROSCOPY; Left No date: VEIN LIGATION AND STRIPPING BMI    Body Mass Index:  31.39 kg/m     Reproductive/Obstetrics negative OB ROS                             Anesthesia Physical Anesthesia Plan  ASA: III and emergent  Anesthesia Plan: General ETT   Post-op Pain Management:    Induction:   PONV Risk Score and Plan: 4 or greater  Airway Management Planned:   Additional Equipment:   Intra-op Plan:   Post-operative Plan:   Informed Consent: I have reviewed the patients History and Physical, chart, labs and discussed the procedure including the risks, benefits and alternatives for the proposed  anesthesia with the patient or authorized representative who has indicated his/her understanding and acceptance.   Dental Advisory Given  Plan Discussed with: CRNA  Anesthesia Plan Comments:         Anesthesia Quick Evaluation

## 2018-09-04 ENCOUNTER — Other Ambulatory Visit: Payer: Self-pay

## 2018-09-04 DIAGNOSIS — T8130XA Disruption of wound, unspecified, initial encounter: Secondary | ICD-10-CM | POA: Diagnosis not present

## 2018-09-04 DIAGNOSIS — Z9049 Acquired absence of other specified parts of digestive tract: Secondary | ICD-10-CM | POA: Diagnosis not present

## 2018-09-04 DIAGNOSIS — E039 Hypothyroidism, unspecified: Secondary | ICD-10-CM | POA: Diagnosis present

## 2018-09-04 DIAGNOSIS — T8131XA Disruption of external operation (surgical) wound, not elsewhere classified, initial encounter: Secondary | ICD-10-CM | POA: Diagnosis present

## 2018-09-04 DIAGNOSIS — Z87891 Personal history of nicotine dependence: Secondary | ICD-10-CM | POA: Diagnosis not present

## 2018-09-04 DIAGNOSIS — B9620 Unspecified Escherichia coli [E. coli] as the cause of diseases classified elsewhere: Secondary | ICD-10-CM | POA: Diagnosis not present

## 2018-09-04 DIAGNOSIS — T8149XA Infection following a procedure, other surgical site, initial encounter: Secondary | ICD-10-CM | POA: Diagnosis present

## 2018-09-04 DIAGNOSIS — E669 Obesity, unspecified: Secondary | ICD-10-CM | POA: Diagnosis present

## 2018-09-04 DIAGNOSIS — K651 Peritoneal abscess: Secondary | ICD-10-CM | POA: Diagnosis present

## 2018-09-04 DIAGNOSIS — Z791 Long term (current) use of non-steroidal anti-inflammatories (NSAID): Secondary | ICD-10-CM | POA: Diagnosis not present

## 2018-09-04 DIAGNOSIS — D62 Acute posthemorrhagic anemia: Secondary | ICD-10-CM | POA: Diagnosis present

## 2018-09-04 DIAGNOSIS — Y838 Other surgical procedures as the cause of abnormal reaction of the patient, or of later complication, without mention of misadventure at the time of the procedure: Secondary | ICD-10-CM | POA: Diagnosis present

## 2018-09-04 DIAGNOSIS — G4730 Sleep apnea, unspecified: Secondary | ICD-10-CM | POA: Diagnosis present

## 2018-09-04 DIAGNOSIS — I96 Gangrene, not elsewhere classified: Secondary | ICD-10-CM | POA: Diagnosis present

## 2018-09-04 DIAGNOSIS — F0390 Unspecified dementia without behavioral disturbance: Secondary | ICD-10-CM | POA: Diagnosis present

## 2018-09-04 DIAGNOSIS — F064 Anxiety disorder due to known physiological condition: Secondary | ICD-10-CM | POA: Diagnosis present

## 2018-09-04 DIAGNOSIS — K219 Gastro-esophageal reflux disease without esophagitis: Secondary | ICD-10-CM | POA: Diagnosis present

## 2018-09-04 DIAGNOSIS — Z79899 Other long term (current) drug therapy: Secondary | ICD-10-CM | POA: Diagnosis not present

## 2018-09-04 DIAGNOSIS — F329 Major depressive disorder, single episode, unspecified: Secondary | ICD-10-CM | POA: Diagnosis present

## 2018-09-04 DIAGNOSIS — Z981 Arthrodesis status: Secondary | ICD-10-CM | POA: Diagnosis not present

## 2018-09-04 DIAGNOSIS — Z886 Allergy status to analgesic agent status: Secondary | ICD-10-CM | POA: Diagnosis not present

## 2018-09-04 DIAGNOSIS — T8141XA Infection following a procedure, superficial incisional surgical site, initial encounter: Secondary | ICD-10-CM | POA: Diagnosis present

## 2018-09-04 DIAGNOSIS — B9689 Other specified bacterial agents as the cause of diseases classified elsewhere: Secondary | ICD-10-CM | POA: Diagnosis not present

## 2018-09-04 DIAGNOSIS — Z6832 Body mass index (BMI) 32.0-32.9, adult: Secondary | ICD-10-CM | POA: Diagnosis not present

## 2018-09-04 DIAGNOSIS — Z885 Allergy status to narcotic agent status: Secondary | ICD-10-CM | POA: Diagnosis not present

## 2018-09-04 LAB — PHOSPHORUS: Phosphorus: 2.6 mg/dL (ref 2.5–4.6)

## 2018-09-04 LAB — BASIC METABOLIC PANEL
ANION GAP: 8 (ref 5–15)
BUN: 8 mg/dL (ref 8–23)
CO2: 25 mmol/L (ref 22–32)
Calcium: 7.1 mg/dL — ABNORMAL LOW (ref 8.9–10.3)
Chloride: 103 mmol/L (ref 98–111)
Creatinine, Ser: 0.69 mg/dL (ref 0.44–1.00)
GFR calc Af Amer: >60 mL/min (ref >60)
Glucose, Bld: 154 mg/dL — ABNORMAL HIGH (ref 70–99)
POTASSIUM: 3.2 mmol/L — AB (ref 3.5–5.1)
Sodium: 136 mmol/L (ref 135–145)

## 2018-09-04 LAB — CBC
HCT: 23.9 % — ABNORMAL LOW (ref 36.0–46.0)
HEMOGLOBIN: 7.7 g/dL — AB (ref 12.0–15.0)
MCH: 31.3 pg (ref 26.0–34.0)
MCHC: 32.2 g/dL (ref 30.0–36.0)
MCV: 97.2 fL (ref 80.0–100.0)
NRBC: 0 % (ref 0.0–0.2)
PLATELETS: 384 10*3/uL (ref 150–400)
RBC: 2.46 MIL/uL — ABNORMAL LOW (ref 3.87–5.11)
RDW: 12.7 % (ref 11.5–15.5)
WBC: 15.8 10*3/uL — ABNORMAL HIGH (ref 4.0–10.5)

## 2018-09-04 LAB — MAGNESIUM: MAGNESIUM: 1.7 mg/dL (ref 1.7–2.4)

## 2018-09-04 MED ORDER — LACTATED RINGERS IV SOLN
INTRAVENOUS | Status: DC | PRN
  Administered 2018-09-03: via INTRAVENOUS

## 2018-09-04 MED ORDER — SUCCINYLCHOLINE CHLORIDE 20 MG/ML IJ SOLN
INTRAMUSCULAR | Status: DC | PRN
  Administered 2018-09-03: 100 mg via INTRAVENOUS

## 2018-09-04 MED ORDER — VENLAFAXINE HCL ER 75 MG PO CP24
150.0000 mg | ORAL_CAPSULE | Freq: Every day | ORAL | Status: DC
  Administered 2018-09-04 – 2018-09-21 (×16): 150 mg via ORAL
  Filled 2018-09-04 (×17): qty 2

## 2018-09-04 MED ORDER — ONDANSETRON HCL 4 MG/2ML IJ SOLN: 4.0000 mg | Freq: Once | INTRAMUSCULAR | Status: DC | PRN

## 2018-09-04 MED ORDER — BUPIVACAINE HCL (PF) 0.5 % IJ SOLN
INTRAMUSCULAR | Status: AC
  Filled 2018-09-04: qty 30

## 2018-09-04 MED ORDER — ONDANSETRON 4 MG PO TBDP
4.0000 mg | ORAL_TABLET | Freq: Four times a day (QID) | ORAL | Status: DC | PRN
  Administered 2018-09-18: 4 mg via ORAL
  Filled 2018-09-04: qty 1

## 2018-09-04 MED ORDER — BUPIVACAINE LIPOSOME 1.3 % IJ SUSP
INTRAMUSCULAR | Status: DC | PRN
  Administered 2018-09-04: 50 mL

## 2018-09-04 MED ORDER — SODIUM CHLORIDE 0.9 % IV SOLN
INTRAVENOUS | Status: DC | PRN
  Administered 2018-09-04: 250 mL via INTRAVENOUS
  Administered 2018-09-06: 08:00:00 via INTRAVENOUS
  Administered 2018-09-10 – 2018-09-18 (×4): 250 mL via INTRAVENOUS

## 2018-09-04 MED ORDER — LACTATED RINGERS IV BOLUS
500.0000 mL | Freq: Once | INTRAVENOUS | Status: AC
  Administered 2018-09-04: 500 mL via INTRAVENOUS

## 2018-09-04 MED ORDER — ACETAMINOPHEN 10 MG/ML IV SOLN
INTRAVENOUS | Status: AC
  Filled 2018-09-04: qty 100

## 2018-09-04 MED ORDER — LACTATED RINGERS IV SOLN
Freq: Once | INTRAVENOUS | Status: AC
  Administered 2018-09-04: 02:00:00 via INTRAVENOUS

## 2018-09-04 MED ORDER — ACETAMINOPHEN 10 MG/ML IV SOLN
INTRAVENOUS | Status: DC | PRN
  Administered 2018-09-04: 1000 mg via INTRAVENOUS

## 2018-09-04 MED ORDER — LIDOCAINE HCL (CARDIAC) PF 100 MG/5ML IV SOSY
PREFILLED_SYRINGE | INTRAVENOUS | Status: DC | PRN
  Administered 2018-09-03: 60 mg via INTRAVENOUS

## 2018-09-04 MED ORDER — INFLUENZA VAC SPLIT HIGH-DOSE 0.5 ML IM SUSY
0.5000 mL | PREFILLED_SYRINGE | INTRAMUSCULAR | Status: DC
  Filled 2018-09-04: qty 0.5

## 2018-09-04 MED ORDER — PROPOFOL 10 MG/ML IV BOLUS
INTRAVENOUS | Status: DC | PRN
  Administered 2018-09-03: 130 mg via INTRAVENOUS

## 2018-09-04 MED ORDER — SUGAMMADEX SODIUM 200 MG/2ML IV SOLN
INTRAVENOUS | Status: DC | PRN
  Administered 2018-09-04: 200 mg via INTRAVENOUS

## 2018-09-04 MED ORDER — BUPIVACAINE LIPOSOME 1.3 % IJ SUSP
INTRAMUSCULAR | Status: AC
  Filled 2018-09-04: qty 20

## 2018-09-04 MED ORDER — PIPERACILLIN-TAZOBACTAM 3.375 G IVPB
3.3750 g | Freq: Three times a day (TID) | INTRAVENOUS | Status: DC
  Administered 2018-09-04 – 2018-09-19 (×46): 3.375 g via INTRAVENOUS
  Filled 2018-09-04 (×46): qty 50

## 2018-09-04 MED ORDER — ENOXAPARIN SODIUM 40 MG/0.4ML ~~LOC~~ SOLN
40.0000 mg | SUBCUTANEOUS | Status: DC
  Administered 2018-09-05 – 2018-09-06 (×2): 40 mg via SUBCUTANEOUS
  Filled 2018-09-04 (×2): qty 0.4

## 2018-09-04 MED ORDER — SUGAMMADEX SODIUM 200 MG/2ML IV SOLN
INTRAVENOUS | Status: AC
  Filled 2018-09-04: qty 2

## 2018-09-04 MED ORDER — FENTANYL CITRATE (PF) 100 MCG/2ML IJ SOLN
INTRAMUSCULAR | Status: DC | PRN
  Administered 2018-09-03 – 2018-09-04 (×4): 50 ug via INTRAVENOUS

## 2018-09-04 MED ORDER — ROCURONIUM BROMIDE 100 MG/10ML IV SOLN
INTRAVENOUS | Status: DC | PRN
  Administered 2018-09-03: 30 mg via INTRAVENOUS
  Administered 2018-09-03: 5 mg via INTRAVENOUS
  Administered 2018-09-04: 10 mg via INTRAVENOUS

## 2018-09-04 MED ORDER — PNEUMOCOCCAL VAC POLYVALENT 25 MCG/0.5ML IJ INJ: 0.5000 mL | INJECTION | INTRAMUSCULAR | Status: DC

## 2018-09-04 MED ORDER — LEVOTHYROXINE SODIUM 50 MCG PO TABS
25.0000 ug | ORAL_TABLET | Freq: Every day | ORAL | Status: DC
  Administered 2018-09-04 – 2018-09-21 (×18): 25 ug via ORAL
  Filled 2018-09-04 (×18): qty 1

## 2018-09-04 MED ORDER — FENTANYL CITRATE (PF) 100 MCG/2ML IJ SOLN
INTRAMUSCULAR | Status: AC
  Filled 2018-09-04: qty 2

## 2018-09-04 MED ORDER — CELECOXIB 200 MG PO CAPS
200.0000 mg | ORAL_CAPSULE | Freq: Every day | ORAL | Status: DC
  Administered 2018-09-04 – 2018-09-15 (×10): 200 mg via ORAL
  Filled 2018-09-04 (×13): qty 1

## 2018-09-04 MED ORDER — TRAMADOL HCL 50 MG PO TABS
50.0000 mg | ORAL_TABLET | Freq: Four times a day (QID) | ORAL | Status: DC | PRN
  Administered 2018-09-04 – 2018-09-21 (×10): 50 mg via ORAL
  Filled 2018-09-04 (×10): qty 1

## 2018-09-04 MED ORDER — KCL IN DEXTROSE-NACL 40-5-0.45 MEQ/L-%-% IV SOLN
INTRAVENOUS | Status: AC
  Administered 2018-09-04 – 2018-09-09 (×9): via INTRAVENOUS
  Filled 2018-09-04 (×13): qty 1000

## 2018-09-04 MED ORDER — VANCOMYCIN HCL IN DEXTROSE 750-5 MG/150ML-% IV SOLN
750.0000 mg | Freq: Two times a day (BID) | INTRAVENOUS | Status: DC
  Administered 2018-09-04 – 2018-09-08 (×9): 750 mg via INTRAVENOUS
  Filled 2018-09-04 (×9): qty 150

## 2018-09-04 MED ORDER — PANTOPRAZOLE SODIUM 40 MG IV SOLR
40.0000 mg | Freq: Every day | INTRAVENOUS | Status: DC
  Administered 2018-09-04 – 2018-09-20 (×17): 40 mg via INTRAVENOUS
  Filled 2018-09-04 (×17): qty 40

## 2018-09-04 MED ORDER — ONDANSETRON HCL 4 MG/2ML IJ SOLN
4.0000 mg | Freq: Four times a day (QID) | INTRAMUSCULAR | Status: DC | PRN
  Administered 2018-09-11 – 2018-09-13 (×3): 4 mg via INTRAVENOUS
  Filled 2018-09-04 (×3): qty 2

## 2018-09-04 MED ORDER — FENTANYL CITRATE (PF) 100 MCG/2ML IJ SOLN: 25.0000 ug | INTRAMUSCULAR | Status: DC | PRN

## 2018-09-04 MED ORDER — PHENYLEPHRINE HCL 10 MG/ML IJ SOLN
INTRAMUSCULAR | Status: DC | PRN
  Administered 2018-09-04 (×2): 150 ug via INTRAVENOUS
  Administered 2018-09-04 (×2): 100 ug via INTRAVENOUS

## 2018-09-04 MED ORDER — ONDANSETRON HCL 4 MG/2ML IJ SOLN
INTRAMUSCULAR | Status: DC | PRN
  Administered 2018-09-04: 4 mg via INTRAVENOUS

## 2018-09-04 MED ORDER — HYDROMORPHONE HCL 1 MG/ML IJ SOLN
0.5000 mg | INTRAMUSCULAR | Status: DC | PRN
  Administered 2018-09-06 – 2018-09-13 (×5): 0.5 mg via INTRAVENOUS
  Filled 2018-09-04 (×6): qty 0.5

## 2018-09-04 MED ORDER — ACETAMINOPHEN 325 MG PO TABS
650.0000 mg | ORAL_TABLET | Freq: Four times a day (QID) | ORAL | Status: DC | PRN
  Administered 2018-09-05 – 2018-09-19 (×6): 650 mg via ORAL
  Filled 2018-09-04 (×6): qty 2

## 2018-09-04 NOTE — Progress Notes (Signed)
Pt A&Ox4 this shift. Conversation with daughter, asked to hold pain medication, only give Tylenol. JP drain.

## 2018-09-04 NOTE — Op Note (Addendum)
Preoperative diagnosis: Incisional abscess and wound dehiscence Postoperative diagnosis: same and anastomotic leak  Procedure: Exploratory laparotomy incisional abscess washout, primary closure of anastomosis leak, placement of phasix mesh  Anesthesia: GETA  Surgeon: Lysle Pearl Assistant: Tama High, MD  Wound Classification: Contaminated  Indications: Patient is a 72 y.o. female  presented with incisional abscess with dehiscence.  See H&P for further details.  Specimen: Wound culture  Complications: None  Estimated Blood Loss: 71mL  Findings:  1.  Extensive necrosis around the abscess cavity with a 6 cm x 8 cm fascial defect 2. purulent secretions drained and cultured 3. Adequate hemostasis.  4.  Subcentimeter leak noted within the staple line of the anastomosis   Description of procedure: The patient was placed in the supine position and GETA anesthesia was induced. The area was prepped and draped in the usual sterile fashion. A timeout was completed verifying correct patient, procedure, site, positioning, and implant(s) and/or special equipment prior to beginning this procedure.  As midline staples were removed, sound of gas escaping from the midline incision was noted, along with desufflation of the swelling.  Opening of the midline wound noted extensive necrotic tissue within the abscess cavity, including a wound dehiscence at the fascial layer, measuring 6 cm x 8 cm.  Deep to the fascia there was some purulent drainage noted for which this was cultured.  Inspection of the area noted extensive scarring of the adjacent bowel, and a slightly visible staple line in the area consistent with the location of the previous colon anastomosis.  From this area, small amounts of feculent material was noted and upon further examination there was a subcentimeter pinpoint hole at the staple line.  This hole was easily sealed with a figure-of-eight 3-0 silk x1.   Extensive exploration of the  surrounding area noted the abscess cavity to be well contained, with no further tunneling beyond the visible operative field.  This included the deeper portions within the peritoneal cavity, due to the extensive scarring that has essentially created a solid abscess cavity floor.  After extensive discussion and assessment of the extent of the infection, as well as the amount of friable tissue and scar tissue already in place, decision was made to forego pursuing a ostomy creation due to the fear of creating more iatrogenic injury attempting to break down the scar tissue.    The fascia was too friable at this point to primary close, therefore a phasix mesh was placed as a onlay fashion to facilitate secondary closure of the fascial defect.  The mesh was secured into place using 0 Vicryl in an interrupted fashion specifically in areas where there was enough healthy fascia in order to keep the suture and mesh in place.  Mesh has adequate overlap along the fascial defect edges to ensure no herniation from the sides.  Prior to mesh placement a large 19Fr Blake drain was placed underneath the mesh.  The drain was secured to the skin with 3-0 nylon.  After extensive irrigation and removal of as much necrotic tissue is possible down to healthy bleeding tissue, Arixtra powder was infused the general area to obtain adequate hemostasis.    Since the drain was allowing adequate drainage of the entire area including through the porous mesh, decision was made to loosely approximate the overlying skin to help facilitate healing.  3-0 Vicryl was used to approximate the subdermal layer in an interrupted fashion and skin staples were used to close to the wound .  Wound was then dressed  with honeycomb dressing and drain sponge. The patient tolerated the procedure well, extubated and was taken to the postanesthesia care unit in satisfactory condition.  Foley placed prior to the procedure will be continued upon transfer to the  floor.

## 2018-09-04 NOTE — Progress Notes (Signed)
Dr. Andree Elk called and notified of pt heart rate in the 115's, BP low, and low urine out put after 1250 mL's of crystalloids. Order for 750 mL bolus of LR. Cassie Carlson E 2:16 AM 09/04/2018

## 2018-09-04 NOTE — Progress Notes (Signed)
Subjective:  CC:  Cassie Carlson is a 72 y.o. female  Hospital stay day 0, 1 Day Post-Op exlap, wound washout, Phasix mesh placement over dehiscence.  HPI: No complaints.  A&Ox3.  No lapses in memory.  Comfortable, not requiring narcotics.  No flatus or BM.    ROS:  A 5 point review of systems was performed and pertinent positives and negatives noted in HPI.   Objective:      Temp:  [97.5 F (36.4 C)-99 F (37.2 C)] 97.6 F (36.4 C) (11/17 2006) Pulse Rate:  [83-116] 83 (11/17 2006) Resp:  [18-24] 18 (11/17 2006) BP: (93-113)/(47-89) 99/53 (11/17 2006) SpO2:  [91 %-98 %] 97 % (11/17 2006)     Height: 5' 3.5" (161.3 cm) Weight: 81.6 kg BMI (Calculated): 31.38   Intake/Output this shift:  Total I/O In: 388.1 [I.V.:238.1; IV Piggyback:150] Out: -        Constitutional :  alert, cooperative, appears stated age and no distress  Respiratory:  clear to auscultation bilaterally  Cardiovascular:  regular rate and rhythm  Gastrointestinal: midline dressing noted to be slightly tented, with no obvious discharge.  Blake drain with dark hematoma like discharge.  non-feculant, no obvious odor.  Sightly focal tenderness in left lateral edge of erythema that has improved significantly since operation but still present along incision line.  staples intact..   Skin: Cool and moist.   Psychiatric: Normal affect, non-agitated, not confused       LABS:  CMP Latest Ref Rng & Units 09/04/2018 09/03/2018 08/29/2018  Glucose 70 - 99 mg/dL 154(H) 136(H) 111(H)  BUN 8 - 23 mg/dL 8 9 7(L)  Creatinine 0.44 - 1.00 mg/dL 0.69 0.71 0.76  Sodium 135 - 145 mmol/L 136 136 136  Potassium 3.5 - 5.1 mmol/L 3.2(L) 2.7(LL) 3.5  Chloride 98 - 111 mmol/L 103 100 101  CO2 22 - 32 mmol/L 25 28 26   Calcium 8.9 - 10.3 mg/dL 7.1(L) 7.6(L) 8.0(L)  Total Protein 6.5 - 8.1 g/dL - 5.4(L) -  Total Bilirubin 0.3 - 1.2 mg/dL - 0.8 -  Alkaline Phos 38 - 126 U/L - 48 -  AST 15 - 41 U/L - 29 -  ALT 0 - 44 U/L - 16 -    CBC Latest Ref Rng & Units 09/04/2018 09/03/2018 08/29/2018  WBC 4.0 - 10.5 K/uL 15.8(H) 14.6(H) 9.5  Hemoglobin 12.0 - 15.0 g/dL 7.7(L) 8.7(L) 9.2(L)  Hematocrit 36.0 - 46.0 % 23.9(L) 26.6(L) 28.0(L)  Platelets 150 - 400 K/uL 384 429(H) 256    RADS: N/A Assessment:   exlap, wound washout, Phasix mesh placement over dehiscence from lap converted to open transverse colectomy for unresectable colon polyp.  Due to the slight tenting, dressing removed and every other staple removed.  Small amount of evacuated gas audible,  Probing of the wound did not reveal any additional purulent drainage, no pain.  Wound packed with 4x4 and covered with paper tape.  Drain still noted to keep suction after packing wound.    Since patient likely will not have any issues with confusion and possible intolerance to a wound vac secondary to the anticipated confusion, may consider bedside placement of wound vac tomorrow if she continues to clinically remain stable and/or improve.  Still will need very low threshold for re-exploration due to the extensive gas formation on initial presentation and amount of necrotic tissue requiring excision as well.  Will monitor with serial abdominal exams, repeat labs.  Keep foley overnight for low urine output and  strict I&O.  Bolus given.   Pt verbalized understanding

## 2018-09-04 NOTE — Consult Note (Signed)
Pharmacy Antibiotic Note  Cassie Carlson is a 72 y.o. female admitted on 09/03/2018 with incisional abscess and wound dehiscence. Patient with has recent transverse colectomy. Pharmacy has been consulted for vancomycin and zosyn dosing. Patient received vancomycin 1g IV and Zosyn 3.375g IV x 1 dose in ED prior exploratory laparotomy incisional abscess washout.  Plan: Ke: 0.063  Vd: 45.3  T1/2: 11  DW: 64.7 kg  Start Vancomycin 750 IV every 12 hours.  Goal trough 15-20 mcg/mL. Calculated trough @ Css is 15. Trough level ordered prior to 4th dose. Will monitor renal function and adjust dose as needed.    Start Zosyn 3.375g IV q8h (4 hour infusion).  Height: 5' 3.5" (161.3 cm) Weight: 180 lb (81.6 kg) IBW/kg (Calculated) : 53.55  Temp (24hrs), Avg:99.9 F (37.7 C), Min:97.6 F (36.4 C), Max:103 F (39.4 C)  Recent Labs  Lab 08/28/18 0443 08/29/18 0457 09/03/18 1739 09/03/18 1740  WBC 8.7 9.5 14.6*  --   CREATININE 0.81 0.76 0.71  --   LATICACIDVEN  --   --   --  1.2    Estimated Creatinine Clearance: 63.1 mL/min (by C-G formula based on SCr of 0.71 mg/dL).    Allergies  Allergen Reactions  . Asa [Aspirin] Other (See Comments)    GI distress/irritaion  . Codeine Other (See Comments)    "Face turned red like a sunburn"  . Ibuprofen Other (See Comments)    GI distress/irritation  . Nsaids Other (See Comments)    GI distress/irritation  . Other Itching    CHG wipes    Antimicrobials this admission: 11/16 Zosyn  >>  11/16 Vancomycin >>   Microbiology results: 11/16 BCx: Pending 11/16 Wound Cx: pending   Thank you for allowing pharmacy to be a part of this patient's care.  Pernell Dupre, PharmD, BCPS Clinical Pharmacist 09/04/2018 2:25 AM

## 2018-09-04 NOTE — Transfer of Care (Signed)
Immediate Anesthesia Transfer of Care Note  Patient: Cassie Carlson  Procedure(s) Performed: EXPLORATORY LAPAROTOMY (N/A Abdomen)  Patient Location: PACU  Anesthesia Type:General  Level of Consciousness: awake, alert  and oriented  Airway & Oxygen Therapy: Patient Spontanous Breathing and Patient connected to nasal cannula oxygen  Post-op Assessment: Report given to RN and Post -op Vital signs reviewed and stable  Post vital signs: Reviewed and stable  Last Vitals:  Vitals Value Taken Time  BP    Temp    Pulse 118 09/04/2018  1:23 AM  Resp 17 09/04/2018  1:23 AM  SpO2 97 % 09/04/2018  1:23 AM  Vitals shown include unvalidated device data.  Last Pain:  Vitals:   09/03/18 1736  TempSrc: Rectal         Complications: No apparent anesthesia complications

## 2018-09-04 NOTE — Anesthesia Post-op Follow-up Note (Signed)
Anesthesia QCDR form completed.        

## 2018-09-04 NOTE — Anesthesia Postprocedure Evaluation (Signed)
Anesthesia Post Note  Patient: Cassie Carlson  Procedure(s) Performed: EXPLORATORY LAPAROTOMY (N/A Abdomen)  Patient location during evaluation: PACU Anesthesia Type: General Level of consciousness: awake and alert Pain management: pain level controlled Vital Signs Assessment: post-procedure vital signs reviewed and stable Respiratory status: spontaneous breathing, nonlabored ventilation, respiratory function stable and patient connected to nasal cannula oxygen Cardiovascular status: blood pressure returned to baseline and stable Postop Assessment: no apparent nausea or vomiting Anesthetic complications: no     Last Vitals:  Vitals:   09/03/18 2130 09/04/18 0121  BP: (!) 110/53 (!) 93/57  Pulse: (!) 112 (!) 115  Resp: 20 18  Temp:  37.2 C  SpO2: 93% 98%    Last Pain:  Vitals:   09/04/18 0121  TempSrc:   PainSc: 0-No pain                 Molli Barrows

## 2018-09-04 NOTE — Anesthesia Procedure Notes (Signed)
Procedure Name: Intubation Date/Time: 09/03/2018 11:40 PM Performed by: Sherrine Maples, CRNA Pre-anesthesia Checklist: Patient identified, Patient being monitored, Timeout performed, Emergency Drugs available and Suction available Patient Re-evaluated:Patient Re-evaluated prior to induction Oxygen Delivery Method: Circle system utilized Preoxygenation: Pre-oxygenation with 100% oxygen Induction Type: IV induction and Rapid sequence Laryngoscope Size: Miller and 2 Grade View: Grade II Tube type: Oral Tube size: 7.0 mm Number of attempts: 1 Airway Equipment and Method: Stylet Placement Confirmation: ETT inserted through vocal cords under direct vision,  positive ETCO2 and breath sounds checked- equal and bilateral Secured at: 21 cm Tube secured with: Tape Dental Injury: Teeth and Oropharynx as per pre-operative assessment

## 2018-09-05 ENCOUNTER — Encounter: Payer: Self-pay | Admitting: Surgery

## 2018-09-05 LAB — URINALYSIS, COMPLETE (UACMP) WITH MICROSCOPIC
BILIRUBIN URINE: NEGATIVE
Glucose, UA: NEGATIVE mg/dL
HGB URINE DIPSTICK: NEGATIVE
Ketones, ur: NEGATIVE mg/dL
NITRITE: NEGATIVE
PROTEIN: 100 mg/dL — AB
Specific Gravity, Urine: 1.033 — ABNORMAL HIGH (ref 1.005–1.030)
pH: 5 (ref 5.0–8.0)

## 2018-09-05 LAB — BASIC METABOLIC PANEL
ANION GAP: 3 — AB (ref 5–15)
BUN: 9 mg/dL (ref 8–23)
CO2: 28 mmol/L (ref 22–32)
Calcium: 7 mg/dL — ABNORMAL LOW (ref 8.9–10.3)
Chloride: 107 mmol/L (ref 98–111)
Creatinine, Ser: 0.87 mg/dL (ref 0.44–1.00)
GFR calc non Af Amer: >60 mL/min (ref >60)
GLUCOSE: 143 mg/dL — AB (ref 70–99)
POTASSIUM: 3.5 mmol/L (ref 3.5–5.1)
Sodium: 138 mmol/L (ref 135–145)

## 2018-09-05 LAB — CBC
HCT: 23.6 % — ABNORMAL LOW (ref 36.0–46.0)
HEMOGLOBIN: 7.4 g/dL — AB (ref 12.0–15.0)
MCH: 31.1 pg (ref 26.0–34.0)
MCHC: 31.4 g/dL (ref 30.0–36.0)
MCV: 99.2 fL (ref 80.0–100.0)
Platelets: 398 10*3/uL (ref 150–400)
RBC: 2.38 MIL/uL — AB (ref 3.87–5.11)
RDW: 12.9 % (ref 11.5–15.5)
WBC: 12.4 10*3/uL — AB (ref 4.0–10.5)
nRBC: 0 % (ref 0.0–0.2)

## 2018-09-05 LAB — VANCOMYCIN, TROUGH: Vancomycin Tr: 15 ug/mL (ref 15–20)

## 2018-09-05 LAB — MAGNESIUM: Magnesium: 1.8 mg/dL (ref 1.7–2.4)

## 2018-09-05 LAB — PHOSPHORUS: PHOSPHORUS: 2.5 mg/dL (ref 2.5–4.6)

## 2018-09-05 NOTE — Consult Note (Signed)
[place] Nurse wound consult note Patient in [facility].  No family present.   Reason for Consult: NPWT to abdominal wound that includes white and black foam.  I learned from supply chain employee, John, that the white foam needed to initiate therapy is not present in this building nor at off-site.  He will have to get it delivered via courier.  He will page me when he finds out a time frame for delivery.  I have provided the patient with multiple pieces of printed materials from [place] pertaining to an ileostomy.  She has agreed to read these so that when I return I can answer any questions she may have.  I have also had a lengthy conversation with her about ileostomy surgery, and have answered her questions to her expressed satisfaction. Val Riles, RN, MSN, CWOCN, CNS-BC, pager [phone]

## 2018-09-05 NOTE — Progress Notes (Signed)
   09/05/18 1100  Clinical Encounter Type  Visited With Patient  Visit Type Initial;Spiritual support  Recommendations Follow-up if requested.  Spiritual Encounters  Spiritual Needs Other (Comment) (Patient has a strong Christian faith.)  Stress Factors  Patient Stress Factors Health changes (Concerns about health decline.)    Patient is concerned about the declining progression of her health. She shared thoughts on her health care team but has turned the matter/outcome over to God. Patient freely testifies to her faith and belief that God is in control. Still, she is fatigued, frustrated, and unsure about the next steps towards healing. She relies on her daughter for assistance understanding and communicating about her treatment options. The patient is hopeful that today her health will improve. Chaplain provided active listening, empathetic responses, and a pastoral presence to help decease the patient's anxiety.

## 2018-09-05 NOTE — Progress Notes (Signed)
Subjective:  CC:  Cassie Carlson is a 72 y.o. female  Hospital stay day 1, 2 Days Post-Op exlap, wound washout, Phasix mesh placement over dehiscence.  HPI: Complains of slight nausea this am.  A&Ox3.  No lapses in memory.  Comfortable, not requiring narcotics.  No flatus or BM.    ROS:  A 5 point review of systems was performed and pertinent positives and negatives noted in HPI.   Objective:      Temp:  [97.5 F (36.4 C)-98.2 F (36.8 C)] 98.2 F (36.8 C) (11/18 0407) Pulse Rate:  [70-90] 70 (11/18 0407) Resp:  [18] 18 (11/18 0407) BP: (99-103)/(41-53) 100/41 (11/18 0407) SpO2:  [95 %-100 %] 100 % (11/18 0407)     Height: 5' 3.5" (161.3 cm) Weight: 81.6 kg BMI (Calculated): 31.38   Intake/Output this shift:  Total I/O In: 200.1 [I.V.:100.7; IV Piggyback:99.3] Out: -        Constitutional :  alert, cooperative, appears stated age and no distress  Respiratory:  clear to auscultation bilaterally  Cardiovascular:  regular rate and rhythm  Gastrointestinal: midline with moderate amount of serosanguinous drainage.  Blake drain with dark brown discharge.  non-feculant, no obvious odor.  Sightly focal tenderness in left lateral edge of erythema, less erythematous but overall size not changed since 12hrs ago.   Skin: Cool and moist.   Psychiatric: Normal affect, non-agitated, not confused       LABS:  CMP Latest Ref Rng & Units 09/05/2018 09/04/2018 09/03/2018  Glucose 70 - 99 mg/dL 143(H) 154(H) 136(H)  BUN 8 - 23 mg/dL 9 8 9   Creatinine 0.44 - 1.00 mg/dL 0.87 0.69 0.71  Sodium 135 - 145 mmol/L 138 136 136  Potassium 3.5 - 5.1 mmol/L 3.5 3.2(L) 2.7(LL)  Chloride 98 - 111 mmol/L 107 103 100  CO2 22 - 32 mmol/L 28 25 28   Calcium 8.9 - 10.3 mg/dL 7.0(L) 7.1(L) 7.6(L)  Total Protein 6.5 - 8.1 g/dL - - 5.4(L)  Total Bilirubin 0.3 - 1.2 mg/dL - - 0.8  Alkaline Phos 38 - 126 U/L - - 48  AST 15 - 41 U/L - - 29  ALT 0 - 44 U/L - - 16   CBC Latest Ref Rng & Units 09/05/2018  09/04/2018 09/03/2018  WBC 4.0 - 10.5 K/uL 12.4(H) 15.8(H) 14.6(H)  Hemoglobin 12.0 - 15.0 g/dL 7.4(L) 7.7(L) 8.7(L)  Hematocrit 36.0 - 46.0 % 23.6(L) 23.9(L) 26.6(L)  Platelets 150 - 400 K/uL 398 384 429(H)    RADS: N/A Assessment:   exlap, wound washout, Phasix mesh placement over dehiscence from lap converted to open transverse colectomy for unresectable colon polyp.  Packing removed and noted to have more fluid laying within wound, so additional staples removed for better exposure. Serosanguinous fluid laying on top of mesh, all drained.  Wound packed with kerlex roll.  Due to amount of fluid buildup, and quality of blake drain output, I had an extensive discussion with patient about possible placement of loop ileostomy through a separate RLQ incision to further facilitate healing of midline wound.  I explained to her my fear of persistent increased output in the general area causing delayed wound healing or persistent infection, especially once we start resuming a diet.  I did emphasize that this is not a emergent matter at this point since she is clinically stable, with a grossly clean wound, as well as a decreasing white count.  I did state to her that we can potentially wait a day or 2  before she makes a final decision, since she was very hesitant on the idea of a ileostomy.  In the meantime she has requested a second opinion from a Museum/gallery conservator.  I have agreed to make arrangements for such a discussion and also recommended a discussion of what an ostomy entails with the wound care nurse.    We will keep n.p.o. for now, Lovenox is resumed, will also ask the wound care nurses to place a VAC as well.  Still will need very low threshold for re-exploration due to the extensive gas formation on initial presentation and amount of necrotic tissue requiring excision as well.  Will monitor with serial abdominal exams, repeat labs.  We will continue Foley for marginal urine output and strict  I&O.  Pt verbalized understanding.

## 2018-09-05 NOTE — Care Management (Signed)
Patient status post exlap, wound washout, Phasix mesh placement over dehiscence from lap converted to open transverse colectomy .  Patient recently discharged from this facility.  Patient lives at home alone.  Patient has one friend Cassie Carlson who lives locally for support.  Cassie Carlson is also available to provide transportation.  Patient's daughter lives in [place].  She was here visiting last week, however she flew back to [place] prior to the readmission.   Patient is open with Cassie Carlson.  Cassie Carlson with Cassie aware of admission. RNCM following.  Patient will potentially need VAC at discharge.  Requesting second opinion.  Will need PT consult when medically stable

## 2018-09-05 NOTE — Progress Notes (Signed)
Initial Nutrition Assessment  DOCUMENTATION CODES:   Obesity unspecified  INTERVENTION:   If patient is expected to remain NPO or have additional surgeries; recommend TPN initiation   Pt likely at high refeeding risk; recommend monitor K, Mg and P labs daily until stable  NUTRITION DIAGNOSIS:   Increased nutrient needs related to wound healing as evidenced by increased estimated needs.  GOAL:   Patient will meet greater than or equal to 90% of their needs  MONITOR:   Diet advancement, Labs, Weight trends, Skin, I & O's  REASON FOR ASSESSMENT:   Malnutrition Screening Tool    ASSESSMENT:   72 y/o female with h/o transverse colectomy for unresectable colon polyp on 08/23/18 now admitted for incisional abscess and wound dehiscence. Pt s/p Exploratory laparotomy incisional abscess washout, primary closure of anastomosis leak, placement of phasix mesh 11/17   Met with pt in room today. Pt reports poor appetite and oral intake for the past 12 days. Pt reports nausea and poor appetite making it difficult for her to get food down. Pt has been drinking Ensure mixed with skim milk at home as she reports the Ensure itself is just too sweet. Pt reports her UBW is ~180lbs. Per chart, pt has lost ~9lbs(5%) over the past 2 weeks; this is significant. Pt reports hair loss and has flag sign of hair. Suspect pt with acute protein malnutrition; unable to diagnose at this time as pt does not meet official criteria and RD is unsure of true oral intake. Pt NPO today. Pt awaiting wound VAC placement. Pt is willing to drink chocolate Premier protein (served with ice) and juven when diet is advanced. If patient is expected to have additional surgeries or remain NPO, recommend TPN initiation. TPN would be beneficial for wound healing. If diet expected to be advanced, recommend supplements and vitamins to encourage wound healing.   Medications reviewed and include: lovenox, synthroid, protonix, NaCl with KCl  + 5% dextrose '@100ml'$ /hr, zosyn, vancomycin, tramadol   Labs reviewed: K 3.5 wnl, P 2.5 wnl, Mg 1.8 wnl Wbc 12.4(H), Hgb 7.4(L), Hct 23.6(L)  NUTRITION - FOCUSED PHYSICAL EXAM:    Most Recent Value  Orbital Region  Moderate depletion  Upper Arm Region  No depletion  Thoracic and Lumbar Region  No depletion  Buccal Region  No depletion  Temple Region  No depletion  Clavicle Bone Region  Mild depletion  Clavicle and Acromion Bone Region  Mild depletion  Scapular Bone Region  No depletion  Dorsal Hand  No depletion  Patellar Region  No depletion  Anterior Thigh Region  No depletion  Posterior Calf Region  No depletion  Edema (RD Assessment)  None  Hair  Reviewed [pt reports hair loss and noted to have flag sign of hair ]  Eyes  Reviewed  Mouth  Reviewed  Skin  Reviewed [ecchymosis]  Nails  Reviewed     Diet Order:   Diet Order            Diet NPO time specified Except for: Ice Chips, Sips with Meds  Diet effective now             EDUCATION NEEDS:   Education needs have been addressed  Skin:  Skin Assessment: Reviewed RN Assessment(incision abdomen )  Last BM:  11/15  Height:   Ht Readings from Last 1 Encounters:  09/03/18 5' 3.5" (1.613 m)    Weight:   Wt Readings from Last 1 Encounters:  09/03/18 81.6 kg    Ideal  Body Weight:  53.4 kg  BMI:  Body mass index is 31.39 kg/m.  Estimated Nutritional Needs:   Kcal:  1700-2000kcal/day   Protein:  90-97g/day   Fluid:  >1.6L/day   Koleen Distance MS, RD, LDN Pager #- [phone] Office#- [phone] After Hours Pager: [phone]

## 2018-09-05 NOTE — Consult Note (Signed)
Pharmacy Antibiotic Note  Cassie Carlson is a 72 y.o. female admitted on 09/03/2018 with incisional abscess and wound dehiscence. Patient with has recent transverse colectomy. Pharmacy has been consulted for vancomycin and zosyn dosing. Patient received vancomycin 1g IV and Zosyn 3.375g IV x 1 dose in ED prior exploratory laparotomy incisional abscess washout.  Plan: Ke: 0.063  Vd: 45.3  T1/2: 11  DW: 64.7 kg  Start Vancomycin 750 IV every 12 hours.  Goal trough 15-20 mcg/mL. Calculated trough @ Css is 15. Trough level ordered prior to 4th dose. Will monitor renal function and adjust dose as needed.    11/18 Vanco trough - 15.  Will continue current regimen of Vancomycin 750 mg every 12 hours.  Start Zosyn 3.375g IV q8h (4 hour infusion).  Height: 5' 3.5" (161.3 cm) Weight: 180 lb (81.6 kg) IBW/kg (Calculated) : 53.55  Temp (24hrs), Avg:97.8 F (36.6 C), Min:97.6 F (36.4 C), Max:98.2 F (36.8 C)  Recent Labs  Lab 09/03/18 1739 09/03/18 1740 09/04/18 0921 09/05/18 0449 09/05/18 1622  WBC 14.6*  --  15.8* 12.4*  --   CREATININE 0.71  --  0.69 0.87  --   LATICACIDVEN  --  1.2  --   --   --   VANCOTROUGH  --   --   --   --  15    Estimated Creatinine Clearance: 58 mL/min (by C-G formula based on SCr of 0.87 mg/dL).    Allergies  Allergen Reactions  . Asa [Aspirin] Other (See Comments)    GI distress/irritaion  . Codeine Other (See Comments)    "Face turned red like a sunburn"  . Ibuprofen Other (See Comments)    GI distress/irritation  . Nsaids Other (See Comments)    GI distress/irritation  . Other Itching    CHG wipes    Antimicrobials this admission: 11/16 Zosyn  >>  11/16 Vancomycin >>   Microbiology results: 11/16 BCx: Pending 11/16 Wound Cx: pending   Thank you for allowing pharmacy to be a part of this patient's care.  Forrest Moron, PharmD, BCPS Clinical Pharmacist 09/05/2018 7:11 PM

## 2018-09-05 NOTE — Consult Note (Signed)
[place] Nurse wound consult note All supplies, including the white foam, are available and ready for placement into the abdominal wound.  The patient is not wanting the [facility] dressing placed until Dr. Lysle Pearl, her daughter who lives in [place], and a second physician willing to give their input, reviews the ordered treatments and discusses her plan of care.  I told the patient I will return tomorrow morning to find out if she will allow placement at that time.  Reason for Consult: VAC dressing to abdominal wound. Wound type: Surgical    Val Riles, RN, MSN, [facility], CNS-BC, pager [phone]

## 2018-09-06 ENCOUNTER — Inpatient Hospital Stay: Payer: Medicare Other | Admitting: Certified Registered Nurse Anesthetist

## 2018-09-06 ENCOUNTER — Encounter
Admission: EM | Disposition: A | Discharge status: Skilled Nursing Facility | Payer: Self-pay | Source: Home / Self Care | Attending: Surgery

## 2018-09-06 ENCOUNTER — Encounter: Payer: Self-pay | Admitting: Surgery

## 2018-09-06 HISTORY — PX: LAPAROTOMY: SHX154

## 2018-09-06 HISTORY — PX: ILEOSTOMY: SHX1783

## 2018-09-06 LAB — CBC
HCT: 25 % — ABNORMAL LOW (ref 36.0–46.0)
Hemoglobin: 7.8 g/dL — ABNORMAL LOW (ref 12.0–15.0)
MCH: 31 pg (ref 26.0–34.0)
MCHC: 31.2 g/dL (ref 30.0–36.0)
MCV: 99.2 fL (ref 80.0–100.0)
Platelets: 441 10*3/uL — ABNORMAL HIGH (ref 150–400)
RBC: 2.52 MIL/uL — ABNORMAL LOW (ref 3.87–5.11)
RDW: 13.2 % (ref 11.5–15.5)
WBC: 9.2 10*3/uL (ref 4.0–10.5)
nRBC: 0 % (ref 0.0–0.2)

## 2018-09-06 LAB — BASIC METABOLIC PANEL
Anion gap: 4 — ABNORMAL LOW (ref 5–15)
BUN: 7 mg/dL — AB (ref 8–23)
CALCIUM: 7.2 mg/dL — AB (ref 8.9–10.3)
CO2: 25 mmol/L (ref 22–32)
CREATININE: 0.78 mg/dL (ref 0.44–1.00)
Chloride: 109 mmol/L (ref 98–111)
GFR calc non Af Amer: >60 mL/min (ref >60)
GLUCOSE: 125 mg/dL — AB (ref 70–99)
Potassium: 4.2 mmol/L (ref 3.5–5.1)
Sodium: 138 mmol/L (ref 135–145)

## 2018-09-06 LAB — TYPE AND SCREEN
ABO/RH(D): O POS
ANTIBODY SCREEN: NEGATIVE

## 2018-09-06 LAB — GLUCOSE, CAPILLARY: Glucose-Capillary: 88 mg/dL (ref 70–99)

## 2018-09-06 LAB — MAGNESIUM: Magnesium: 1.9 mg/dL (ref 1.7–2.4)

## 2018-09-06 LAB — PHOSPHORUS: Phosphorus: 2.7 mg/dL (ref 2.5–4.6)

## 2018-09-06 SURGERY — LAPAROTOMY, EXPLORATORY
Anesthesia: General | Site: Abdomen

## 2018-09-06 MED ORDER — MEPERIDINE HCL 50 MG/ML IJ SOLN: 6.2500 mg | INTRAMUSCULAR | Status: DC | PRN

## 2018-09-06 MED ORDER — ONDANSETRON HCL 4 MG/2ML IJ SOLN
INTRAMUSCULAR | Status: AC
  Filled 2018-09-06: qty 2

## 2018-09-06 MED ORDER — ENOXAPARIN SODIUM 40 MG/0.4ML ~~LOC~~ SOLN
40.0000 mg | SUBCUTANEOUS | Status: DC
  Administered 2018-09-07 – 2018-09-21 (×15): 40 mg via SUBCUTANEOUS
  Filled 2018-09-06 (×15): qty 0.4

## 2018-09-06 MED ORDER — PHENYLEPHRINE 8 MG IN D5W 100 ML (0.08MG/ML) PREMIX OPTIME
INJECTION | INTRAVENOUS | Status: DC | PRN
  Administered 2018-09-06: 25 ug/min via INTRAVENOUS

## 2018-09-06 MED ORDER — FENTANYL CITRATE (PF) 100 MCG/2ML IJ SOLN
INTRAMUSCULAR | Status: DC | PRN
  Administered 2018-09-06 (×5): 50 ug via INTRAVENOUS

## 2018-09-06 MED ORDER — PROPOFOL 10 MG/ML IV BOLUS
INTRAVENOUS | Status: DC | PRN
  Administered 2018-09-06: 20 mg via INTRAVENOUS
  Administered 2018-09-06: 120 mg via INTRAVENOUS

## 2018-09-06 MED ORDER — ACETAMINOPHEN 10 MG/ML IV SOLN
INTRAVENOUS | Status: DC | PRN
  Administered 2018-09-06: 1000 mg via INTRAVENOUS

## 2018-09-06 MED ORDER — ROCURONIUM BROMIDE 50 MG/5ML IV SOLN
INTRAVENOUS | Status: AC
  Filled 2018-09-06: qty 3

## 2018-09-06 MED ORDER — POLYVINYL ALCOHOL 1.4 % OP SOLN
1.0000 [drp] | OPHTHALMIC | Status: DC | PRN
  Administered 2018-09-06: 1 [drp] via OPHTHALMIC
  Filled 2018-09-06: qty 15

## 2018-09-06 MED ORDER — PHENYLEPHRINE HCL 10 MG/ML IJ SOLN
INTRAMUSCULAR | Status: DC | PRN
  Administered 2018-09-06 (×4): 100 ug via INTRAVENOUS

## 2018-09-06 MED ORDER — SUGAMMADEX SODIUM 200 MG/2ML IV SOLN
INTRAVENOUS | Status: DC | PRN
  Administered 2018-09-06: 200 mg via INTRAVENOUS

## 2018-09-06 MED ORDER — PHENYLEPHRINE HCL 10 MG/ML IJ SOLN
INTRAMUSCULAR | Status: AC
  Filled 2018-09-06: qty 1

## 2018-09-06 MED ORDER — SUCCINYLCHOLINE CHLORIDE 20 MG/ML IJ SOLN
INTRAMUSCULAR | Status: AC
  Filled 2018-09-06: qty 1

## 2018-09-06 MED ORDER — DEXAMETHASONE SODIUM PHOSPHATE 10 MG/ML IJ SOLN
INTRAMUSCULAR | Status: AC
  Filled 2018-09-06: qty 1

## 2018-09-06 MED ORDER — SUGAMMADEX SODIUM 200 MG/2ML IV SOLN
INTRAVENOUS | Status: AC
  Filled 2018-09-06: qty 2

## 2018-09-06 MED ORDER — ENOXAPARIN SODIUM 40 MG/0.4ML ~~LOC~~ SOLN: 40.0000 mg | SUBCUTANEOUS | Status: DC

## 2018-09-06 MED ORDER — LACTATED RINGERS IV SOLN
INTRAVENOUS | Status: DC | PRN
  Administered 2018-09-06: 08:00:00 via INTRAVENOUS

## 2018-09-06 MED ORDER — FENTANYL CITRATE (PF) 100 MCG/2ML IJ SOLN
25.0000 ug | INTRAMUSCULAR | Status: DC | PRN
  Administered 2018-09-06: 50 ug via INTRAVENOUS
  Administered 2018-09-06: 25 ug via INTRAVENOUS

## 2018-09-06 MED ORDER — FENTANYL CITRATE (PF) 100 MCG/2ML IJ SOLN
INTRAMUSCULAR | Status: AC
  Administered 2018-09-06: 50 ug via INTRAVENOUS
  Filled 2018-09-06: qty 2

## 2018-09-06 MED ORDER — LIDOCAINE HCL (PF) 2 % IJ SOLN
INTRAMUSCULAR | Status: AC
  Filled 2018-09-06: qty 10

## 2018-09-06 MED ORDER — DEXAMETHASONE SODIUM PHOSPHATE 10 MG/ML IJ SOLN
INTRAMUSCULAR | Status: DC | PRN
  Administered 2018-09-06: 5 mg via INTRAVENOUS

## 2018-09-06 MED ORDER — LIDOCAINE-EPINEPHRINE 1 %-1:100000 IJ SOLN
INTRAMUSCULAR | Status: AC
  Filled 2018-09-06: qty 1

## 2018-09-06 MED ORDER — SUCCINYLCHOLINE CHLORIDE 20 MG/ML IJ SOLN
INTRAMUSCULAR | Status: DC | PRN
  Administered 2018-09-06: 100 mg via INTRAVENOUS

## 2018-09-06 MED ORDER — ARTIFICIAL TEARS OPHTHALMIC OINT
TOPICAL_OINTMENT | OPHTHALMIC | Status: DC | PRN
  Filled 2018-09-06: qty 3.5

## 2018-09-06 MED ORDER — ONDANSETRON HCL 4 MG/2ML IJ SOLN
INTRAMUSCULAR | Status: DC | PRN
  Administered 2018-09-06: 4 mg via INTRAVENOUS

## 2018-09-06 MED ORDER — LIDOCAINE HCL (CARDIAC) PF 100 MG/5ML IV SOSY
PREFILLED_SYRINGE | INTRAVENOUS | Status: DC | PRN
  Administered 2018-09-06: 100 mg via INTRAVENOUS

## 2018-09-06 MED ORDER — ACETAMINOPHEN 10 MG/ML IV SOLN
INTRAVENOUS | Status: AC
  Filled 2018-09-06: qty 100

## 2018-09-06 MED ORDER — FENTANYL CITRATE (PF) 250 MCG/5ML IJ SOLN
INTRAMUSCULAR | Status: AC
  Filled 2018-09-06: qty 5

## 2018-09-06 MED ORDER — EPHEDRINE SULFATE 50 MG/ML IJ SOLN
INTRAMUSCULAR | Status: AC
  Filled 2018-09-06: qty 1

## 2018-09-06 MED ORDER — ROCURONIUM BROMIDE 100 MG/10ML IV SOLN
INTRAVENOUS | Status: DC | PRN
  Administered 2018-09-06: 20 mg via INTRAVENOUS
  Administered 2018-09-06: 10 mg via INTRAVENOUS
  Administered 2018-09-06: 50 mg via INTRAVENOUS
  Administered 2018-09-06: 10 mg via INTRAVENOUS

## 2018-09-06 MED ORDER — PROMETHAZINE HCL 25 MG/ML IJ SOLN: 6.2500 mg | INTRAMUSCULAR | Status: DC | PRN

## 2018-09-06 MED ORDER — BUPIVACAINE HCL (PF) 0.25 % IJ SOLN
INTRAMUSCULAR | Status: AC
  Filled 2018-09-06: qty 30

## 2018-09-06 SURGICAL SUPPLY — 47 items
BARRIER SKIN 2 1/4 (WOUND CARE) ×2 IMPLANT
BARRIER SKIN 2 1/4INCH (WOUND CARE) ×1
BARRIER SKIN OD1.75 2 1/4 FLNG (WOUND CARE) IMPLANT
BLADE BOVIE TIP EXT 4 (BLADE) ×2 IMPLANT
BRR SKN FLT 1.75X2.25 2 PC (WOUND CARE) ×1
BULB RESERV EVAC DRAIN JP 100C (MISCELLANEOUS) ×2 IMPLANT
CANISTER SUCT 1200ML W/VALVE (MISCELLANEOUS) ×3 IMPLANT
CATH ROBINSON RED A/P 18FR (CATHETERS) ×2 IMPLANT
CHLORAPREP W/TINT 26ML (MISCELLANEOUS) ×3 IMPLANT
COVER WAND RF STERILE (DRAPES) ×3 IMPLANT
DRAIN CHANNEL 28F RND 3/8 FF (WOUND CARE) ×2 IMPLANT
DRAPE LAPAROTOMY 100X77 ABD (DRAPES) ×3 IMPLANT
DRAPE SHEET LG 3/4 BI-LAMINATE (DRAPES) ×2 IMPLANT
DRSG OPSITE POSTOP 4X10 (GAUZE/BANDAGES/DRESSINGS) ×3 IMPLANT
DRSG OPSITE POSTOP 4X8 (GAUZE/BANDAGES/DRESSINGS) ×3 IMPLANT
DRSG TEGADERM 4X10 (GAUZE/BANDAGES/DRESSINGS) ×3 IMPLANT
DRSG TELFA 3X8 NADH (GAUZE/BANDAGES/DRESSINGS) ×3 IMPLANT
ELECT REM PT RETURN 9FT ADLT (ELECTROSURGICAL) ×3
ELECTRODE REM PT RTRN 9FT ADLT (ELECTROSURGICAL) ×1 IMPLANT
GAUZE 4X4 16PLY RFD (DISPOSABLE) ×2 IMPLANT
GLOVE BIOGEL PI IND STRL 7.0 (GLOVE) ×1 IMPLANT
GLOVE BIOGEL PI INDICATOR 7.0 (GLOVE) ×2
GLOVE SURG SYN 7.0 (GLOVE) ×3 IMPLANT
GLOVE SURG SYN 7.0 PF PI (GLOVE) ×1 IMPLANT
GOWN STRL REUS W/ TWL LRG LVL3 (GOWN DISPOSABLE) ×2 IMPLANT
GOWN STRL REUS W/TWL LRG LVL3 (GOWN DISPOSABLE) ×6
KIT TURNOVER KIT A (KITS) ×3 IMPLANT
LABEL OR SOLS (LABEL) ×3 IMPLANT
NS IRRIG 1000ML POUR BTL (IV SOLUTION) ×3 IMPLANT
PACK BASIN MAJOR ARMC (MISCELLANEOUS) ×3 IMPLANT
PACK COLON CLEAN CLOSURE (MISCELLANEOUS) ×3 IMPLANT
PAD DRESSING TELFA 3X8 NADH (GAUZE/BANDAGES/DRESSINGS) ×1 IMPLANT
PULSAVAC PLUS IRRIG FAN TIP (DISPOSABLE) ×3
SPONGE LAP 18X18 RF (DISPOSABLE) ×3 IMPLANT
STAPLER SKIN PROX 35W (STAPLE) ×2 IMPLANT
SUT PDS AB 1 TP1 54 (SUTURE) ×6 IMPLANT
SUT SILK 2 0 (SUTURE) ×3
SUT SILK 2-0 18XBRD TIE 12 (SUTURE) ×1 IMPLANT
SUT SILK 3 0 (SUTURE) ×3
SUT SILK 3-0 18XBRD TIE 12 (SUTURE) ×1 IMPLANT
SUT VIC AB 2-0 SH 27 (SUTURE) ×3
SUT VIC AB 2-0 SH 27XBRD (SUTURE) IMPLANT
SUT VIC AB 3-0 SH 27 (SUTURE) ×9
SUT VIC AB 3-0 SH 27X BRD (SUTURE) ×2 IMPLANT
SUT VIC AB 3-0 SH 8-18 (SUTURE) ×3 IMPLANT
TIP FAN IRRIG PULSAVAC PLUS (DISPOSABLE) IMPLANT
TRAY FOLEY MTR SLVR 16FR STAT (SET/KITS/TRAYS/PACK) ×3 IMPLANT

## 2018-09-06 NOTE — Progress Notes (Signed)
Per MD okay for RN to order eye drops PRN. Pt is complaning of pain in her eyes after surgery. MD has already assess pt. Will continue to assess and monitor pt.

## 2018-09-06 NOTE — OR Nursing (Signed)
Previously implanted mesh, explanted, discarded in biohazard per surgeon.

## 2018-09-06 NOTE — Anesthesia Post-op Follow-up Note (Signed)
Anesthesia QCDR form completed.        

## 2018-09-06 NOTE — Transfer of Care (Signed)
Immediate Anesthesia Transfer of Care Note  Patient: Cassie Carlson  Procedure(s) Performed: EXPLORATORY LAPAROTOMY (N/A Abdomen) ILEOSTOMY (N/A Abdomen)  Patient Location: PACU  Anesthesia Type:General  Level of Consciousness: awake, drowsy and patient cooperative  Airway & Oxygen Therapy: Patient Spontanous Breathing and Patient connected to face mask oxygen  Post-op Assessment: Report given to RN, Post -op Vital signs reviewed and stable and Patient moving all extremities  Post vital signs: Reviewed and stable  Last Vitals:  Vitals Value Taken Time  BP 115/57 09/06/2018 12:35 PM  Temp    Pulse 99 09/06/2018 12:40 PM  Resp 25 09/06/2018 12:40 PM  SpO2 98 % 09/06/2018 12:40 PM  Vitals shown include unvalidated device data.  Last Pain:  Vitals:   09/06/18 0732  TempSrc:   PainSc: 0-No pain      Patients Stated Pain Goal: 1 (50/53/97 6734)  Complications: No apparent anesthesia complications

## 2018-09-06 NOTE — Anesthesia Procedure Notes (Signed)
Procedure Name: Intubation Date/Time: 09/06/2018 8:25 AM Performed by: Lowry Bowl, CRNA Pre-anesthesia Checklist: Patient identified, Emergency Drugs available, Suction available, Patient being monitored and Timeout performed Patient Re-evaluated:Patient Re-evaluated prior to induction Oxygen Delivery Method: Circle system utilized Preoxygenation: Pre-oxygenation with 100% oxygen Induction Type: IV induction, Cricoid Pressure applied and Rapid sequence Ventilation: Mask ventilation without difficulty Laryngoscope Size: Mac and 3 Grade View: Grade II Tube type: Oral Tube size: 7.0 mm Number of attempts: 1 Airway Equipment and Method: Stylet Placement Confirmation: ETT inserted through vocal cords under direct vision,  positive ETCO2 and breath sounds checked- equal and bilateral Secured at: 21 cm Tube secured with: Tape Dental Injury: Teeth and Oropharynx as per pre-operative assessment

## 2018-09-06 NOTE — Op Note (Signed)
Preoperative diagnosis: Incisional abscess and wound dehiscence, anastomotic leak Postoperative diagnosis: same  Procedure: Exploratory laparotomy , abdominal wound washout, phasix mesh removal, loop ileostomy creation, wound vac placement Anesthesia: GETA  Surgeon: Lysle Pearl Assistant: Windell Moment, MD  Wound Classification: dirty  Indications: Patient is a 72 y.o. female  presented with incisional abscess with dehiscence.  Status post ex lap abdominal washout primary closure of anastomotic leak and phasic mesh placement.  Initial hope was to avoid a loop ileostomy which the patient was hesitant about as well as the concerns for postoperative confusion noted on her initial surgery that may prohibit any continuous and efficient ostomy care.  Vitals remained stable and leukocytosis was resolving, however there was feculent drainage from the midline wound as well as within the [place] drain on clinical exam this morning so was emergently taken back for above procedure since the current treatment did not contain the perforation.  I explained to the patient that at this point there is no way to avoid a loop ileostomy.  Patient verbalized understanding and daughter was notified prior to the procedure regarding the clinical update, and she also agreed to proceed with a loop ileostomy creation.  Specimen: none  Complications: None  Estimated Blood Loss: 29mL  Findings:  1.  Feculent drainage from the failed primary repair of the original anastomotic leak.   2.  Abdominal wound still well contained within the original abscess cavity.  Extensive scar tissue buildup within the bowel and the surrounding tissue making it too unsafe to resect the anastomosis  3.  Loop ileostomy creation and phasic mesh removal.  Clear output from both the distal and proximal side.   The proximal and is on the medial aspect, distal end on lateral aspect of wound.        4.  Adequate hemostasis. 5.  No further feculent drainage  from the area at end of procedure  Description of procedure: The patient was placed in the supine position and GETA anesthesia was induced. The area was prepped and draped in the usual sterile fashion. A timeout was completed verifying correct patient, procedure, site, positioning, and implant(s) and/or special equipment prior to beginning this procedure.  Remaining staples removed and feculant drainage all removed.  The previously placed phasix mesh also removed completely.  Inspection of the area noted extensive scarring of the adjacent bowel, and a slightly visible staple line in the area consistent with the location of the previous colon anastomosis and primary repair.  This area re-opened and roughly 1cm x 1cm opening was noted.   No other areas of leakage noted.  Extensive exploration of the surrounding area again noted the wound to be well contained, with no further tunneling beyond the visible operative field.  This included the deeper portions within the peritoneal cavity, due to the extensive scarring that has essentially created a solid abscess cavity floor.  After extensive discussion and assessment of the extent of the infection, as well as the amount of friable tissue and scar tissue already in place, decision was made to forego attempted resection of the injured bowel due to fears of creating injuries and excessive bleeding.  Wound edges cleaned with additional debridement down to healthy tissue and then extensively irrigated with pulse irrigator.  No bleeding or unhealthy tissue left at end of this portion of exam.  Due to the recurrent drainage, decision made to proceed with loop ileostomy creation as discussed with patient and daughter prior to the procedure.  A RLQ incision made and  carried down to fascia.  Peritoneal cavity entered and noted to have minimal scarring.  Initial attempt at locating terminal ileum proved to be difficult with small incision lateral the epigastric vessels, so  had to be extended and epigastrics suture ligated to obtain better view within operative field.   Cecum located and the terminal ileum was traced back to areas of adhesions closer to the midline wound.  The adhesions removed with sharp dissection.  Additional dissection carried along retropertioneal area to further release the terminal ileum until it was able to be brought to abdominal wall under minimal tension.  Inspection of the loop noted subcentimeter small serosal tear at base of mesentery, which was suture repaired with 3-0 vicryl.  The RLQ incision was partially closed with 2-0 vicry and the lateral end left open just enough to fit the loop ileostomy through.  3-0 vicryl used to close subcutaneous layer and skin closed with staples.  Foley catheter placed under the loop ileum as a bridge and sutured to skin using 3-0 vicryl.  An enterotomy was made and the double barrel loop ileostomy matured using multiple 3-0 vicryl in a brooke fashion.  Care was taken to ensure to minimize and opening between skin and bowel.  The matured ostomy then dressed with ostomy appliance after confirming no bleeding and patent proximal and distal ends.  The proximal and is on the medial aspect, distal end on lateral aspect of wound.       21Fr Keenan Bachelor drain was placed within the midline and    secured to the skin with 3-0 nylon.  White sponge placed to cover the drain and bowel, then black sponge placed in wound cavity and wound vac applied with adequate suction.  Blake drain noted to have adequate suction as well.  The patient tolerated the procedure well, extubated and was taken to the postanesthesia care unit in satisfactory condition.

## 2018-09-06 NOTE — Anesthesia Preprocedure Evaluation (Signed)
Anesthesia Evaluation  Patient identified by MRN, date of birth, ID band Patient awake    Reviewed: Allergy & Precautions, NPO status , Patient's Chart, lab work & pertinent test results  History of Anesthesia Complications (+) PONV and history of anesthetic complications  Airway Mallampati: III  TM Distance: >3 FB Neck ROM: Full    Dental  (+) Poor Dentition   Pulmonary sleep apnea (does not tolerate CPAP) , neg COPD, former smoker,    breath sounds clear to auscultation- rhonchi (-) wheezing      Cardiovascular (-) hypertension(-) CAD, (-) Past MI, (-) Cardiac Stents and (-) CABG  Rhythm:Regular Rate:Normal - Systolic murmurs and - Diastolic murmurs    Neuro/Psych  Headaches, PSYCHIATRIC DISORDERS Anxiety Depression Dementia    GI/Hepatic Neg liver ROS, GERD  ,  Endo/Other  neg diabetesHypothyroidism   Renal/GU negative Renal ROS     Musculoskeletal  (+) Arthritis ,   Abdominal (+) + obese,   Peds  Hematology negative hematology ROS (+)   Anesthesia Other Findings Past Medical History: No date: Anxiety No date: Arthritis No date: Dementia ([place]) No date: Depression No date: Family history of adverse reaction to anesthesia     Comment:  "siister had issues with heart racing during lung               biopsy" No date: Family history of breast cancer No date: Family history of colon cancer No date: Family history of colon cancer No date: Family history of prostate cancer No date: Family history of prostate cancer No date: Fibroids     Comment:  excessive vaginal bleeding No date: GERD (gastroesophageal reflux disease) No date: Headache No date: Hypothyroidism No date: PONV (postoperative nausea and vomiting) No date: Sleep apnea   Reproductive/Obstetrics                             Anesthesia Physical Anesthesia Plan  ASA: III and emergent  Anesthesia Plan: General   Post-op  Pain Management:    Induction: Intravenous  PONV Risk Score and Plan: 3 and Ondansetron and Dexamethasone  Airway Management Planned: Oral ETT  Additional Equipment:   Intra-op Plan:   Post-operative Plan: Extubation in OR and Possible Post-op intubation/ventilation  Informed Consent: I have reviewed the patients History and Physical, chart, labs and discussed the procedure including the risks, benefits and alternatives for the proposed anesthesia with the patient or authorized representative who has indicated his/her understanding and acceptance.   Dental advisory given  Plan Discussed with: CRNA and Anesthesiologist  Anesthesia Plan Comments: (Pt has DNR order, we discussed plan for suspension of DNR order during periop period due to possible need for respiratory support, BP support and potential reactions to medications during the procedure. )        Anesthesia Quick Evaluation

## 2018-09-06 NOTE — Progress Notes (Signed)
   09/06/18 1400  Clinical Encounter Type  Visited With Patient  Visit Type Follow-up;Spiritual support  Recommendations Follow-up as requested.  Spiritual Encounters  Spiritual Needs Other (Comment);Prayer  Stress Factors  Patient Stress Factors Health changes   Chaplain offered silent and energetic prayer while the patient was in PACU. Chaplain will follow-up during unit rounding.

## 2018-09-06 NOTE — Anesthesia Procedure Notes (Signed)
Arterial Line Insertion Start/End8/15/2019 8:56 AM, 06/12/2018 8:56 AM Performed by: Emmie Niemann, MD, anesthesiologist  Patient location: Pre-op. Preanesthetic checklist: patient identified, IV checked, site marked, risks and benefits discussed, surgical consent, monitors and equipment checked, pre-op evaluation, timeout performed and anesthesia consent radial was placed Catheter size: 20 Fr Hand hygiene performed , maximum sterile barriers used  and Seldinger technique used  Attempts: 1 Procedure performed without using ultrasound guided technique. Following insertion, dressing applied and Biopatch. Post procedure assessment: normal and unchanged  Patient tolerated the procedure well with no immediate complications.

## 2018-09-06 NOTE — Progress Notes (Signed)
Pt pulled out NG tube at this time, Dr. Lysle Pearl notified of NG tube no longer inserted and states leave NG tube out and not to insert another.

## 2018-09-06 NOTE — Anesthesia Postprocedure Evaluation (Signed)
Anesthesia Post Note  Patient: Cassie Carlson  Procedure(s) Performed: EXPLORATORY LAPAROTOMY (N/A Abdomen) ILEOSTOMY (N/A Abdomen)  Patient location during evaluation: PACU Anesthesia Type: General Level of consciousness: awake and alert and oriented Pain management: pain level controlled Vital Signs Assessment: post-procedure vital signs reviewed and stable Respiratory status: spontaneous breathing, nonlabored ventilation and respiratory function stable Cardiovascular status: blood pressure returned to baseline and stable Postop Assessment: no signs of nausea or vomiting Anesthetic complications: no   Pt complaining of sandy feeling in both eyes, no obvious injury on exam, discussed with pt that this may be corneal abrasion and would treat with lubrication  Last Vitals:  Vitals:   09/06/18 1335 09/06/18 1450  BP:  118/60  Pulse: 90   Resp: 20   Temp:    SpO2: 96%     Last Pain:  Vitals:   09/06/18 1320  TempSrc:   PainSc: Asleep                 Heinz Eckert

## 2018-09-07 ENCOUNTER — Encounter: Payer: Self-pay | Admitting: Surgery

## 2018-09-07 LAB — BASIC METABOLIC PANEL
Anion gap: 4 — ABNORMAL LOW (ref 5–15)
BUN: 7 mg/dL — ABNORMAL LOW (ref 8–23)
CALCIUM: 7.5 mg/dL — AB (ref 8.9–10.3)
CO2: 25 mmol/L (ref 22–32)
Chloride: 112 mmol/L — ABNORMAL HIGH (ref 98–111)
Creatinine, Ser: 0.78 mg/dL (ref 0.44–1.00)
GFR calc Af Amer: >60 mL/min (ref >60)
GLUCOSE: 157 mg/dL — AB (ref 70–99)
Potassium: 4.8 mmol/L (ref 3.5–5.1)
Sodium: 141 mmol/L (ref 135–145)

## 2018-09-07 LAB — CBC
HCT: 25.9 % — ABNORMAL LOW (ref 36.0–46.0)
Hemoglobin: 8 g/dL — ABNORMAL LOW (ref 12.0–15.0)
MCH: 30.5 pg (ref 26.0–34.0)
MCHC: 30.9 g/dL (ref 30.0–36.0)
MCV: 98.9 fL (ref 80.0–100.0)
NRBC: 0 % (ref 0.0–0.2)
PLATELETS: 475 10*3/uL — AB (ref 150–400)
RBC: 2.62 MIL/uL — ABNORMAL LOW (ref 3.87–5.11)
RDW: 13.2 % (ref 11.5–15.5)
WBC: 10.6 10*3/uL — ABNORMAL HIGH (ref 4.0–10.5)

## 2018-09-07 LAB — MAGNESIUM: MAGNESIUM: 1.8 mg/dL (ref 1.7–2.4)

## 2018-09-07 LAB — PHOSPHORUS: Phosphorus: 2.7 mg/dL (ref 2.5–4.6)

## 2018-09-07 MED ORDER — VITAMIN C 500 MG PO TABS
250.0000 mg | ORAL_TABLET | Freq: Two times a day (BID) | ORAL | Status: DC
  Administered 2018-09-07 – 2018-09-09 (×5): 250 mg via ORAL
  Filled 2018-09-07 (×5): qty 1

## 2018-09-07 MED ORDER — OCUVITE-LUTEIN PO CAPS
1.0000 | ORAL_CAPSULE | Freq: Every day | ORAL | Status: DC
  Administered 2018-09-07 – 2018-09-08 (×2): 1 via ORAL
  Filled 2018-09-07 (×3): qty 1

## 2018-09-07 MED ORDER — JUVEN PO PACK
1.0000 | PACK | Freq: Two times a day (BID) | ORAL | Status: DC
  Administered 2018-09-07 – 2018-09-08 (×3): 1 via ORAL

## 2018-09-07 MED ORDER — BOOST / RESOURCE BREEZE PO LIQD CUSTOM
1.0000 | Freq: Three times a day (TID) | ORAL | Status: DC
  Administered 2018-09-07 – 2018-09-08 (×4): 1 via ORAL

## 2018-09-07 NOTE — Care Management Important Message (Signed)
Copy of signed IM left with patient in room.  

## 2018-09-07 NOTE — Consult Note (Signed)
Patient seen in [facility].  No family present.  Patient a bit sleepy and slightly slow to think of questions she wanted to ask; but, conversation is appropriate for this stage of her recovery.  Explained role of ostomy nurse and creation of stoma  Explained stoma characteristics (budded, care) Demonstrated pouch change (cutting new skin barrier, measuring stoma, cleaning peristomal skin and stoma, use of barrier ring) Operative pouch removed.  Flat, 2 piece with barrier ring placed.  There is a portion of a catheter sutured in place as a retention rod.  The RLQ ileostomy stoma has produced a small amount of dark green effluent in the operative pouch.   Stoma Color:  Red, oval shaped.  Slightly less than 1" top to bottom; 1 1/4 inches side to side Texture:  Moist    Location: RLQ Type:  Ileostomy Height:  Budded above skin level  Output:  Liquid, Thin,Green  Sutures:  Intact circumferentially with sutured retention rod Peristomal skin:  Intact   Pouching instructions: 1. Clean peristomal skin with water only; pat dry. 2.   Crust with stoma powder and skin prep pads to treat superficial skin irritation (if present); allow to air dry completely before going to next step. 3.   Apply a barrier ring around the stoma prior to placing a new pouching system. 4.   Measure the stoma using the measuring template. 5.   Transfer the size to the pouch barrier, then cut the opening. 6.   Remove the barrier backing and place it on a completely dry surface. 7.   If using a two piece pouching system, attach the pouch to the barrier.  Plan is to return tomorrow and continue patient teaching.  Val Riles, RN, MSN, CWOCN, CNS-BC, pager [phone]

## 2018-09-07 NOTE — Progress Notes (Signed)
PT Cancellation Note  Patient Details Name: Cassie Carlson MRN: [id number] DOB: 07/16/1946   Cancelled Treatment:    Reason Eval/Treat Not Completed: Patient declined, no reason specified(Consult received and chart reviewed. Patient politely declined session at this time, stating "it's been a rough day".  Will re-attempt next date as medically appropriate and available.)   Elza Sortor H. Owens Shark, PT, DPT, NCS 09/07/18, 2:16 PM [phone]

## 2018-09-07 NOTE — Progress Notes (Signed)
Per MD okay for pt to have clear liquids diet and remove foley catheter.

## 2018-09-07 NOTE — Progress Notes (Signed)
Subjective:  CC:  Cassie Carlson is a 72 y.o. female  Hospital stay day 3, 1 Day Post-Op exlap, wound washout, Phasix mesh placement over dehiscence.  HPI: Slight discomfort in the new ostomy site.  A&Ox3.  No lapses in memory.     ROS:  A 5 point review of systems was performed and pertinent positives and negatives noted in HPI.   Objective:      Temp:  [97.2 F (36.2 C)-98.3 F (36.8 C)] 98.3 F (36.8 C) (11/20 0539) Pulse Rate:  [67-106] 76 (11/20 0539) Resp:  [16-20] 18 (11/20 0539) BP: (114-129)/(54-67) 122/64 (11/20 0539) SpO2:  [90 %-100 %] 100 % (11/20 0539)     Height: 5' 3.5" (161.3 cm) Weight: 81.6 kg BMI (Calculated): 31.38   Intake/Output this shift:  Total I/O In: -  Out: 50 [Stool:50]       Constitutional :  alert, cooperative, appears stated age and no distress  Respiratory:  clear to auscultation bilaterally  Cardiovascular:  regular rate and rhythm  Gastrointestinal: soft, wound vac in place with some brown discharge.  no output from blake drain.  ostomy noted to have green output 52ml according to records..   Skin: Cool and moist.   Psychiatric: Normal affect, non-agitated, not confused       LABS:  CMP Latest Ref Rng & Units 09/07/2018 09/06/2018 09/05/2018  Glucose 70 - 99 mg/dL 157(H) 125(H) 143(H)  BUN 8 - 23 mg/dL 7(L) 7(L) 9  Creatinine 0.44 - 1.00 mg/dL 0.78 0.78 0.87  Sodium 135 - 145 mmol/L 141 138 138  Potassium 3.5 - 5.1 mmol/L 4.8 4.2 3.5  Chloride 98 - 111 mmol/L 112(H) 109 107  CO2 22 - 32 mmol/L 25 25 28   Calcium 8.9 - 10.3 mg/dL 7.5(L) 7.2(L) 7.0(L)  Total Protein 6.5 - 8.1 g/dL - - -  Total Bilirubin 0.3 - 1.2 mg/dL - - -  Alkaline Phos 38 - 126 U/L - - -  AST 15 - 41 U/L - - -  ALT 0 - 44 U/L - - -   CBC Latest Ref Rng & Units 09/07/2018 09/06/2018 09/05/2018  WBC 4.0 - 10.5 K/uL 10.6(H) 9.2 12.4(H)  Hemoglobin 12.0 - 15.0 g/dL 8.0(L) 7.8(L) 7.4(L)  Hematocrit 36.0 - 46.0 % 25.9(L) 25.0(L) 23.6(L)  Platelets 150 - 400  K/uL 475(H) 441(H) 398    RADS: N/A Assessment:   exlap, wound washout, Phasix mesh placement over dehiscence from lap converted to open transverse colectomy for unresectable colon polyp.  Noted to have increased feculant drainage POD#3, so returned to OR for wound washout, phasix mesh removal, and loop ileostomy creation.  Wound output low.  Ostomy productive.  Will advance to clears and start PT.  Foley out.  Continue IV abx due to large amount of contamination.  Increased wbc likely reactive but will continue to monitor closely.  Clinically not septic and exam is reassuring today.  Appreciate ancillary support.

## 2018-09-08 ENCOUNTER — Inpatient Hospital Stay: Payer: Self-pay

## 2018-09-08 DIAGNOSIS — Z932 Ileostomy status: Secondary | ICD-10-CM

## 2018-09-08 DIAGNOSIS — T8131XA Disruption of external operation (surgical) wound, not elsewhere classified, initial encounter: Secondary | ICD-10-CM

## 2018-09-08 DIAGNOSIS — B9620 Unspecified Escherichia coli [E. coli] as the cause of diseases classified elsewhere: Secondary | ICD-10-CM

## 2018-09-08 DIAGNOSIS — Z886 Allergy status to analgesic agent status: Secondary | ICD-10-CM

## 2018-09-08 DIAGNOSIS — B9689 Other specified bacterial agents as the cause of diseases classified elsewhere: Secondary | ICD-10-CM

## 2018-09-08 DIAGNOSIS — Z91048 Other nonmedicinal substance allergy status: Secondary | ICD-10-CM

## 2018-09-08 DIAGNOSIS — K651 Peritoneal abscess: Secondary | ICD-10-CM

## 2018-09-08 DIAGNOSIS — T8149XA Infection following a procedure, other surgical site, initial encounter: Secondary | ICD-10-CM

## 2018-09-08 DIAGNOSIS — D649 Anemia, unspecified: Secondary | ICD-10-CM

## 2018-09-08 DIAGNOSIS — Z86010 Personal history of colonic polyps: Secondary | ICD-10-CM

## 2018-09-08 DIAGNOSIS — Z9049 Acquired absence of other specified parts of digestive tract: Secondary | ICD-10-CM

## 2018-09-08 DIAGNOSIS — Z87891 Personal history of nicotine dependence: Secondary | ICD-10-CM

## 2018-09-08 DIAGNOSIS — E039 Hypothyroidism, unspecified: Secondary | ICD-10-CM

## 2018-09-08 DIAGNOSIS — Z79890 Hormone replacement therapy (postmenopausal): Secondary | ICD-10-CM

## 2018-09-08 DIAGNOSIS — Z885 Allergy status to narcotic agent status: Secondary | ICD-10-CM

## 2018-09-08 DIAGNOSIS — Z978 Presence of other specified devices: Secondary | ICD-10-CM

## 2018-09-08 LAB — CBC
HEMATOCRIT: 25.4 % — AB (ref 36.0–46.0)
HEMOGLOBIN: 7.7 g/dL — AB (ref 12.0–15.0)
MCH: 30.8 pg (ref 26.0–34.0)
MCHC: 30.3 g/dL (ref 30.0–36.0)
MCV: 101.6 fL — AB (ref 80.0–100.0)
NRBC: 0 % (ref 0.0–0.2)
PLATELETS: 476 10*3/uL — AB (ref 150–400)
RBC: 2.5 MIL/uL — ABNORMAL LOW (ref 3.87–5.11)
RDW: 13.3 % (ref 11.5–15.5)
WBC: 8.2 10*3/uL (ref 4.0–10.5)

## 2018-09-08 LAB — BASIC METABOLIC PANEL
Anion gap: 3 — ABNORMAL LOW (ref 5–15)
BUN: 10 mg/dL (ref 8–23)
CALCIUM: 7.6 mg/dL — AB (ref 8.9–10.3)
CO2: 26 mmol/L (ref 22–32)
Chloride: 108 mmol/L (ref 98–111)
Creatinine, Ser: 0.84 mg/dL (ref 0.44–1.00)
Glucose, Bld: 103 mg/dL — ABNORMAL HIGH (ref 70–99)
POTASSIUM: 4.4 mmol/L (ref 3.5–5.1)
SODIUM: 137 mmol/L (ref 135–145)

## 2018-09-08 LAB — CULTURE, BLOOD (ROUTINE X 2)
Culture: NO GROWTH
Culture: NO GROWTH
SPECIAL REQUESTS: ADEQUATE

## 2018-09-08 LAB — MAGNESIUM: MAGNESIUM: 1.9 mg/dL (ref 1.7–2.4)

## 2018-09-08 LAB — VANCOMYCIN, TROUGH: Vancomycin Tr: 21 ug/mL (ref 15–20)

## 2018-09-08 LAB — PHOSPHORUS: PHOSPHORUS: 2.3 mg/dL — AB (ref 2.5–4.6)

## 2018-09-08 MED ORDER — K PHOS MONO-SOD PHOS DI & MONO 155-852-130 MG PO TABS
500.0000 mg | ORAL_TABLET | ORAL | Status: AC
  Administered 2018-09-08 (×2): 500 mg via ORAL
  Filled 2018-09-08 (×2): qty 2

## 2018-09-08 MED ORDER — VANCOMYCIN HCL 500 MG IV SOLR
500.0000 mg | Freq: Two times a day (BID) | INTRAVENOUS | Status: DC
  Administered 2018-09-08: 500 mg via INTRAVENOUS
  Filled 2018-09-08 (×3): qty 500

## 2018-09-08 MED ORDER — PRO-STAT SUGAR FREE PO LIQD
30.0000 mL | Freq: Four times a day (QID) | ORAL | Status: DC
  Administered 2018-09-08: 30 mL via ORAL

## 2018-09-08 MED ORDER — SODIUM PHOSPHATES 45 MMOLE/15ML IV SOLN
10.0000 mmol | Freq: Once | INTRAVENOUS | Status: DC
  Filled 2018-09-08: qty 3.33

## 2018-09-08 NOTE — Progress Notes (Addendum)
Pt was able to be assisted to stand and walk but has difficulty with controlling her balance and has low endurance with confusion that seems new.  Follow acutely for strength and to increase safety with all mobility as well as her transition to SNF for progressive rehab.   09/08/18 1200  PT Visit Information  Last PT Received On 09/08/18  Assistance Needed +1  History of Present Illness 72 yo female with transverse colectomy after polyp removal presented with fecal drainage from her wounds, with dehiscense and abcess formation.  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  PMHx:  colon resection, anxiety, dementia, depression, cholecystectomy, hypothyroidism, d&c uterus, cervical spine fusion, L shoulder arthroscopy,   Precautions  Precautions Fall  Precaution Comments abdominal incision  Restrictions  Weight Bearing Restrictions No  Home Living  Family/patient expects to be discharged to: Unsure  Living Arrangements Alone  Available Help at Discharge Friend(s);Available 24 hours/day  Type of Home House  Home Access Stairs to enter  Entrance Stairs-Number of Steps 4  Entrance Stairs-Rails Can reach both  Home Layout One level  Tax adviser - 2 wheels  Additional Comments walked with no AD per pt  Prior Function  Level of Independence Independent  Communication  Communication No difficulties  Pain Assessment  Pain Score 5  Pain Location Surgical site with mobility  Pain Descriptors / Indicators Operative site guarding  Pain Intervention(s) Limited activity within patient's tolerance;Monitored during session;Premedicated before session;Repositioned  Cognition  Arousal/Alertness Awake/alert  Behavior During Therapy WFL for tasks assessed/performed  Overall Cognitive Status No family/caregiver present to determine baseline cognitive functioning  General Comments pt is able to  follow directions but has poor history information and is confused about details, keeps forgetting she is in hospital  Upper Extremity Assessment  Upper Extremity Assessment Generalized weakness  Lower Extremity Assessment  Lower Extremity Assessment Generalized weakness  Cervical / Trunk Assessment  Cervical / Trunk Assessment Normal  Bed Mobility  Overal bed mobility Needs Assistance  Bed Mobility Sidelying to Sit;Sit to Sidelying  Rolling Min assist  Sidelying to sit Mod assist  Supine to sit Min assist  Sit to sidelying Min assist  General bed mobility comments requires bedrail  Transfers  Overall transfer level Needs assistance  Equipment used Rolling walker (2 wheeled)  Transfers Sit to/from Stand;Stand Pivot Transfers  Sit to Stand Mod assist  Stand pivot transfers Mod assist  General transfer comment cues for hand placement and to follow through on controlling standing balance  Ambulation/Gait  Ambulation/Gait assistance Mod assist  Gait Distance (Feet) 2 Feet  Assistive device Rolling walker (2 wheeled)  Gait Pattern/deviations Step-to pattern;Wide base of support;Trunk flexed  General Gait Details at side of bed with IV pole, unsteady and feels off in her  head  Gait velocity decreased  Gait velocity interpretation <1.8 ft/sec, indicate of risk for recurrent falls  Balance  Overall balance assessment Needs assistance  Sitting-balance support Feet supported  Sitting balance-Leahy Scale Fair  Standing balance support Bilateral upper extremity supported;During functional activity  Standing balance-Leahy Scale Poor  General Comments  General comments (skin integrity, edema, etc.) pt is wearing a wound vac on surgery site, in pain and confused about where she is with mult reminders she is in the hospital  Exercises  Exercises Other exercises (pt had 4- LE strength)  PT - End of Session  Equipment Utilized During Treatment Gait belt  Activity Tolerance Patient tolerated  treatment well;Patient limited by fatigue  Patient left in bed;with call bell/phone within reach;with bed alarm set  Nurse Communication Mobility status  PT Assessment  PT Recommendation/Assessment Patient needs continued PT services  PT Visit Diagnosis Unsteadiness on feet (R26.81);Muscle weakness (generalized) (M62.81);Other abnormalities of gait and mobility (R26.89)  PT Problem List Decreased strength;Decreased range of motion;Decreased activity tolerance;Decreased balance;Decreased mobility;Decreased coordination;Decreased cognition;Decreased knowledge of use of DME;Decreased safety awareness;Obesity;Decreased skin integrity;Pain  Barriers to Discharge Inaccessible home environment;Decreased caregiver support  Barriers to Discharge Comments lives alone with stairs to enter house  PT Plan  PT Frequency (ACUTE ONLY) Min 2X/week  PT Treatment/Interventions (ACUTE ONLY) DME instruction;Gait training;Stair training;Functional mobility training;Therapeutic activities;Therapeutic exercise;Balance training;Neuromuscular re-education;Patient/family education  AM-PAC PT "6 Clicks" Daily Activity Outcome Measure  Difficulty turning over in bed (including adjusting bedclothes, sheets and blankets)? 1  Difficulty moving from lying on back to sitting on the side of the bed?  1  Difficulty sitting down on and standing up from a chair with arms (e.g., wheelchair, bedside commode, etc,.)? 1  Help needed moving to and from a bed to chair (including a wheelchair)? 2  Help needed walking in hospital room? 3  Help needed climbing 3-5 steps with a railing?  1  6 Click Score 9  Mobility G Code  CL  PT Recommendation  Follow Up Recommendations SNF  PT equipment Rolling walker with 5" wheels  Individuals Consulted  Consulted and Agree with Results and Recommendations Patient  Acute Rehab PT Goals  Patient Stated Goal to get home and to feel better with her head  PT Goal Formulation With patient  Time For  Goal Achievement 09/22/18  Potential to Achieve Goals Good  PT Time Calculation  PT Start Time (ACUTE ONLY) 1214  PT Stop Time (ACUTE ONLY) 1246  PT Time Calculation (min) (ACUTE ONLY) 32 min  PT General Charges  $$ ACUTE PT VISIT 1 Visit  PT Evaluation  $PT Eval Moderate Complexity 1 Mod  PT Treatments  $Therapeutic Activity 8-22 mins    Mee Hives, PT MS Acute Rehab Dept. Number: [place]

## 2018-09-08 NOTE — Progress Notes (Signed)
Spoke with RN Elita Quick about the patients current vascular access. At this time the patient has had 6 peripheral IVs since the time of admission and according to the Nurse the patient will be here another week at least getting IV antibiotics and other iv medications. I reached out to MD Lysle Pearl about the possibility of PICC placement. Currently awaiting a response.

## 2018-09-08 NOTE — Consult Note (Signed)
NAME: Cassie Carlson  DOB: [birth year]  MRN: [id number]  Date/Time: 09/08/2018 4:41 PM  sakai Subjective:  REASON FOR CONSULT: surgical wound infection Pt is a poor historian, chart reviewed ? NAKAYA MISHKIN is a 72 y.o. female with a history of polyp of transverse colon which was unresectable by colonoscopy underwent on 08/22/18 laparoscopy which was converted to laparotomy with transverse colon colectomy and primary anastomosis. During that procedure dense adhesions between the omentum colon and mesentery noted and was released. A small splenic laceration was noted and bleeding controlled with packing. Postoperatively patient had some pain control issues requiring some narcotic use, which likely resulted in some altered mental status and postoperative ileus.  Altered mental status improved over time with PRN medications for anxiety and confusion, and gradual weaning of all narcotics.  Postoperative ileus was treated with NG tube decompression and a period of n.p.o.Patient was discharged home on 08/29/18 in stable condition with home health.  On 11/16 patient was brought back to the ED as redness was noted at the surgical site by [facility] and EMS was called. In the ED temp was 103. WBC was 14.6. CT abdomen revealed a 14.4 x 12 x 10 cm predominantly gas-filled cavity extending through a large anterior abdominal wall defect into the subcutaneous fat,  On 11/17  she underwent  Exploratory laparotomy incisional abscess washout, primary closure of anastomosis leak, placement of phasix mesh. The surgeon note is below Opening of the midline wound noted extensive necrotic tissue within the abscess cavity, including a wound dehiscence at the fascial layer, measuring 6 cm x 8 cm.  Deep to the fascia there was some purulent drainage noted for which this was cultured.  Inspection of the area noted extensive scarring of the adjacent bowel, and a slightly visible staple line in the area consistent with the location of the  previous colon anastomosis.  From this area, small amounts of feculent material was noted and upon further examination there was a subcentimeter pinpoint hole at the staple line.  This hole was easily sealed with a figure-of-eight 3-0 silk x1.   Surgical culture grew e.coli, enterobacter and it is still being reincubated for anerobes. No fever since admission   On 11/19 she went back for Exploratory laparotomy , abdominal wound washout, phasix mesh removal, loop ileostomy creation, wound vac placement. Surgeon note   1.Feculent drainage from the failed primary repair of the original anastomotic leak.   2.  Abdominal wound still well contained within the original abscess cavity.  Extensive scar tissue buildup within the bowel and the surrounding tissue making it too unsafe to resect the anastomosis  3.  Loop ileostomy creation and phasic mesh removal.  Clear output from both the distal and proximal side.   The proximal and is on the medial aspect, distal end on lateral aspect of wound.        4.  Adequate hemostasis. 5.  No further feculent drainage from the area at end of procedure   Wbc now is 8.2   PMH Gall stones Colon polyp Depression Gastritis GERD Migraine Hypothyroidism  Surgical history ANTERIOR FUSION CERVICAL SPINE 2016  . ARTHROSCOPIC ROTATOR CUFF REPAIR Left  . CHOLECYSTECTOMY  . COLONOSCOPY 02/13/10  Tubular Adenoma Repeat 01/2013  . COLONOSCOPY 03/13/14  . COLONOSCOPY 06/14/2018  Tubular adenoma of the colon/Hyperplastic colon polyp/Repeat 42yr/MUS  . COMBINED AUGMENTATION MAMMAPLASTY AND ABDOMINOPLASTY 1991  . EGD 03/13/14  . EGD 06/14/2018  Gastritis/Fundic gland polyp/Repeat 65yr/MUS  . Face Lift 01/2002  .  FUNCTIONAL ENDOSCOPIC SINUS SURGERY 1991  . HYSTERECTOMY 1992  BSO-menorrhagia-fibroids  . left rotator cuff repair 1990  . OTHER SURGERY Bilateral 1991  Vein stripping  . OTHER SURGERY  Sinus surgery  . TONSILLECTOMY 1953  . UPPER GASTROINTESTINAL  ENDOSCOPY 07/12/12  no repeat per MUS      SH Former smoker   Family History  Problem Relation Age of Onset  . Breast cancer Mother 63  . Lung cancer Mother   . Prostate cancer Father        dx 28's  . Bladder Cancer Father   . Colon cancer Father 85  . Throat cancer Sister   . Lung cancer Sister   . Lung cancer Brother        agent orange, hx of smoking  . Brain cancer Brother   . Leukemia Paternal Aunt   . Prostate cancer Paternal Grandfather 43  . Lymphoma Other 35   Allergies  Allergen Reactions  . Asa [Aspirin] Other (See Comments)    GI distress/irritaion  . Codeine Other (See Comments)    "Face turned red like a sunburn"  . Ibuprofen Other (See Comments)    GI distress/irritation  . Nsaids Other (See Comments)    GI distress/irritation  . Other Itching    CHG wipes    ? Current Facility-Administered Medications  Medication Dose Route Frequency Provider Last Rate Last Dose  . 0.9 %  sodium chloride infusion   Intravenous PRN Lysle Pearl, Isami, DO   Stopped at 09/06/18 1218  . acetaminophen (TYLENOL) tablet 650 mg  650 mg Oral Q6H PRN Sakai, Isami, DO   650 mg at 09/05/18 1523  . artificial tears (LACRILUBE) ophthalmic ointment   Both Eyes Q3H PRN [place], Amy, MD      . celecoxib (CELEBREX) capsule 200 mg  200 mg Oral Daily Sakai, Isami, DO   200 mg at 09/08/18 0833  . dextrose 5 % and 0.45 % NaCl with KCl 40 mEq/L infusion   Intravenous Continuous Dallie Piles, RPH 50 mL/hr at 09/08/18 1040    . enoxaparin (LOVENOX) injection 40 mg  40 mg Subcutaneous Q24H Sakai, Isami, DO   40 mg at 09/08/18 0834  . feeding supplement (BOOST / RESOURCE BREEZE) liquid 1 Container  1 Container Oral TID BM Sakai, Isami, DO   1 Container at 09/08/18 1310  . feeding supplement (PRO-STAT SUGAR FREE 64) liquid 30 mL  30 mL Oral QID Sakai, Isami, DO   30 mL at 09/08/18 1440  . HYDROmorphone (DILAUDID) injection 0.5 mg  0.5 mg Intravenous Q3H PRN Sakai, Isami, DO   0.5 mg at 09/08/18  1039  . Influenza vac split quadrivalent PF (FLUZONE HIGH-DOSE) injection 0.5 mL  0.5 mL Intramuscular Tomorrow-1000 Sakai, Isami, DO      . levothyroxine (SYNTHROID, LEVOTHROID) tablet 25 mcg  25 mcg Oral QAC breakfast Sakai, Isami, DO   25 mcg at 09/08/18 3086  . multivitamin-lutein (OCUVITE-LUTEIN) capsule 1 capsule  1 capsule Oral Daily Sakai, Isami, DO   1 capsule at 09/08/18 1129  . nutrition supplement (JUVEN) (JUVEN) powder packet 1 packet  1 packet Oral BID BM Sakai, Isami, DO   1 packet at 09/08/18 1310  . ondansetron (ZOFRAN-ODT) disintegrating tablet 4 mg  4 mg Oral Q6H PRN Lysle Pearl, Isami, DO       Or  . ondansetron (ZOFRAN) injection 4 mg  4 mg Intravenous Q6H PRN Sakai, Isami, DO      . pantoprazole (PROTONIX) injection 40  mg  40 mg Intravenous QHS Sakai, Isami, DO   40 mg at 09/07/18 2221  . phosphorus (K PHOS NEUTRAL) tablet 500 mg  500 mg Oral Q4H Dallie Piles, RPH      . piperacillin-tazobactam (ZOSYN) IVPB 3.375 g  3.375 g Intravenous Q8H Sakai, Isami, DO 12.5 mL/hr at 09/08/18 1313 3.375 g at 09/08/18 1313  . pneumococcal 23 valent vaccine (PNU-IMMUNE) injection 0.5 mL  0.5 mL Intramuscular Tomorrow-1000 Sakai, Isami, DO      . polyvinyl alcohol (LIQUIFILM TEARS) 1.4 % ophthalmic solution 1 drop  1 drop Both Eyes PRN Lysle Pearl, Isami, DO   1 drop at 09/06/18 2208  . traMADol (ULTRAM) tablet 50 mg  50 mg Oral Q6H PRN Sakai, Isami, DO   50 mg at 09/07/18 0401  . vancomycin (VANCOCIN) 500 mg in sodium chloride 0.9 % 100 mL IVPB  500 mg Intravenous Q12H Sakai, Isami, DO      . venlafaxine XR (EFFEXOR-XR) 24 hr capsule 150 mg  150 mg Oral Q breakfast Sakai, Isami, DO   150 mg at 09/08/18 0833  . vitamin C (ASCORBIC ACID) tablet 250 mg  250 mg Oral BID Sakai, Isami, DO   250 mg at 09/08/18 3500     Abtx:  Anti-infectives (From admission, onward)   Start     Dose/Rate Route Frequency Ordered Stop   09/08/18 1700  vancomycin (VANCOCIN) 500 mg in sodium chloride 0.9 % 100 mL IVPB      500 mg 100 mL/hr over 60 Minutes Intravenous Every 12 hours 09/08/18 0533     09/04/18 0500  vancomycin (VANCOCIN) IVPB 750 mg/150 ml premix  Status:  Discontinued     750 mg 150 mL/hr over 60 Minutes Intravenous Every 12 hours 09/04/18 0215 09/08/18 0533   09/04/18 0300  piperacillin-tazobactam (ZOSYN) IVPB 3.375 g     3.375 g 12.5 mL/hr over 240 Minutes Intravenous Every 8 hours 09/04/18 0215     09/03/18 1830  vancomycin (VANCOCIN) IVPB 1000 mg/200 mL premix     1,000 mg 200 mL/hr over 60 Minutes Intravenous  Once 09/03/18 1816 09/03/18 2019   09/03/18 1830  piperacillin-tazobactam (ZOSYN) IVPB 3.375 g     3.375 g 100 mL/hr over 30 Minutes Intravenous  Once 09/03/18 1816 09/03/18 1919      REVIEW OF SYSTEMS:  Const: negative fever, negative chills, negative weight loss Eyes: negative diplopia or visual changes, negative eye pain ENT: negative coryza, negative sore throat Resp: negative cough, hemoptysis, dyspnea Cards: negative for chest pain, palpitations, lower extremity edema GU: negative for frequency, dysuria and hematuria GI: some  abdominal pain, no diarrhea, bleeding, constipation Skin: negative for rash and pruritus Heme: negative for easy bruising and gum/nose bleeding MS:  muscle weakness Neurolo:negative for headaches, dizziness, vertigo, some memory problems  Psych: negative for feelings of anxiety, depression  Endocrine: no polyuria or polydipsia Allergy/Immunology- negative for any medication or food allergies ? Pertinent Positives include : Objective:  VITALS:  BP (!) 113/55 (BP Location: Left Arm)   Pulse 75   Temp 98.3 F (36.8 C) (Axillary)   Resp 16   Ht 5' 3.5" (1.613 m)   Wt 86.6 kg   SpO2 100%   BMI 33.29 kg/m  PHYSICAL EXAM:  General: Flat affect, pale Alert, cooperative, no distress,  Head: Normocephalic, without obvious abnormality, atraumatic. Eyes: Conjunctivae clear, anicteric sclerae. Pupils are equal ENT Nares normal. No drainage or  sinus tenderness. Lips, mucosa, and tongue normal. No Thrush Neck:  Supple, symmetrical, no adenopathy, thyroid: non tender no carotid bruit and no JVD. Back: did not examine Lungs: b/l air entry Heart: s1s2 Abdomen: Soft, wound vac covering the abd wall dehiscence ileostomy Extremities: atraumatic, no cyanosis. No edema. No clubbing Skin: No rashes or lesions. Or bruising Lymph: Cervical, supraclavicular normal. Neurologic: Grossly non-focal Pertinent Labs Lab Results CBC    Component Value Date/Time   WBC 8.2 09/08/2018 0434   RBC 2.50 (L) 09/08/2018 0434   HGB 7.7 (L) 09/08/2018 0434   HCT 25.4 (L) 09/08/2018 0434   PLT 476 (H) 09/08/2018 0434   MCV 101.6 (H) 09/08/2018 0434   MCH 30.8 09/08/2018 0434   MCHC 30.3 09/08/2018 0434   RDW 13.3 09/08/2018 0434   LYMPHSABS 1.1 09/03/2018 1739   MONOABS 1.1 (H) 09/03/2018 1739   EOSABS 0.0 09/03/2018 1739   BASOSABS 0.0 09/03/2018 1739    CMP Latest Ref Rng & Units 09/08/2018 09/07/2018 09/06/2018  Glucose 70 - 99 mg/dL 103(H) 157(H) 125(H)  BUN 8 - 23 mg/dL 10 7(L) 7(L)  Creatinine 0.44 - 1.00 mg/dL 0.84 0.78 0.78  Sodium 135 - 145 mmol/L 137 141 138  Potassium 3.5 - 5.1 mmol/L 4.4 4.8 4.2  Chloride 98 - 111 mmol/L 108 112(H) 109  CO2 22 - 32 mmol/L 26 25 25   Calcium 8.9 - 10.3 mg/dL 7.6(L) 7.5(L) 7.2(L)  Total Protein 6.5 - 8.1 g/dL - - -  Total Bilirubin 0.3 - 1.2 mg/dL - - -  Alkaline Phos 38 - 126 U/L - - -  AST 15 - 41 U/L - - -  ALT 0 - 44 U/L - - -      Microbiology: Recent Results (from the past 240 hour(s))  Blood culture (routine x 2)     Status: None   Collection Time: 09/03/18  5:37 PM  Result Value Ref Range Status   Specimen Description BLOOD BLOOD RIGHT WRIST  Final   Special Requests   Final    BOTTLES DRAWN AEROBIC AND ANAEROBIC Blood Culture adequate volume   Culture   Final    NO GROWTH 5 DAYS Performed at [facility], [address] [place], [place] [address]    Report Status  09/08/2018 FINAL  Final  Blood culture (routine x 2)     Status: None   Collection Time: 09/03/18  5:39 PM  Result Value Ref Range Status   Specimen Description BLOOD LEFT ANTECUBITAL  Final   Special Requests   Final    BOTTLES DRAWN AEROBIC AND ANAEROBIC Blood Culture results may not be optimal due to an excessive volume of blood received in culture bottles   Culture   Final    NO GROWTH 5 DAYS Performed at [facility], [address], [place], [place] [address]    Report Status 09/08/2018 FINAL  Final  Aerobic/Anaerobic Culture (surgical/deep wound)     Status: None (Preliminary result)   Collection Time: 09/03/18 11:58 PM  Result Value Ref Range Status   Specimen Description   Final    WOUND ABDOMEN Performed at [facility], [address] [place], [place] [address]    Special Requests PATIENT ON FOLLOWING VANC AND ZOSYN  Final   Gram Stain   Final    NO WBC SEEN FEW GRAM POSITIVE COCCI IN PAIRS MODERATE GRAM VARIABLE ROD    Culture   Final    MODERATE ESCHERICHIA COLI MODERATE ENTEROBACTER CLOACAE HOLDING FOR POSSIBLE ANAEROBE CULTURE REINCUBATED FOR BETTER GROWTH Performed at  [facility], [place] [address], [place], [place] [address]    Report Status PENDING  Incomplete   Organism ID, Bacteria ESCHERICHIA COLI  Final   Organism ID, Bacteria ENTEROBACTER CLOACAE  Final      Susceptibility   Enterobacter cloacae - MIC*    CEFAZOLIN >=64 RESISTANT Resistant     CEFEPIME <=1 SENSITIVE Sensitive     CEFTAZIDIME <=1 SENSITIVE Sensitive     CEFTRIAXONE <=1 SENSITIVE Sensitive     CIPROFLOXACIN <=0.25 SENSITIVE Sensitive     GENTAMICIN <=1 SENSITIVE Sensitive     IMIPENEM 4 SENSITIVE Sensitive     TRIMETH/SULFA <=20 SENSITIVE Sensitive     PIP/TAZO <=4 SENSITIVE Sensitive     * MODERATE ENTEROBACTER CLOACAE   Escherichia coli - MIC*    AMPICILLIN <=2 SENSITIVE Sensitive     CEFAZOLIN <=4 SENSITIVE Sensitive     CEFEPIME <=1 SENSITIVE  Sensitive     CEFTAZIDIME <=1 SENSITIVE Sensitive     CEFTRIAXONE <=1 SENSITIVE Sensitive     CIPROFLOXACIN <=0.25 SENSITIVE Sensitive     GENTAMICIN <=1 SENSITIVE Sensitive     IMIPENEM <=0.25 SENSITIVE Sensitive     TRIMETH/SULFA <=20 SENSITIVE Sensitive     AMPICILLIN/SULBACTAM <=2 SENSITIVE Sensitive     PIP/TAZO <=4 SENSITIVE Sensitive     Extended ESBL NEGATIVE Sensitive     * MODERATE ESCHERICHIA COLI   IMAGING RESULTS: ? Impression/Recommendation 72 y.o. female with a history of polyp of transverse colon which was unresectable by colonoscopy underwent on 08/22/18 laparoscopy which was converted to laparotomy with transverse colon colectomy and primary anastomosis. During that procedure dense adhesions between the omentum colon and mesentery noted and was released. A small splenic laceration was noted and bleeding controlled with packing. Postoperatively patient had some pain control issues requiring some narcotic use, which likely resulted in some altered mental status and postoperative ileus.  Altered mental status improved over time with PRN medications for anxiety and confusion, and gradual weaning of all narcotics.  Postoperative ileus was treated with NG tube decompression and a period of n.p.o.Patient was discharged home on 08/29/18 in stable condition with home health.  On 11/16 patient was brought back to the ED as redness was noted at the surgical site by [facility] and EMS was called. In the ED temp was 103. WBC was 14.6. CT abdomen revealed a 14.4 x 12 x 10 cm predominantly gas-filled cavity extending through a large anterior abdominal wall defect into the subcutaneous fat,  On 11/17  she underwent  Exploratory laparotomy incisional abscess washout, primary closure of anastomosis leak, placement of phasix mesh? On 11/19 she went back for Exploratory laparotomy , abdominal wound washout, phasix mesh removal, loop ileostomy creation, wound vac placement. ? ?Intra-abdominal abscess with   abdominal wall dehiscence and  leaking from the transverse colon anastomosis site with fecal soilage of peritoneum  S/p surgeries as above and has a wound vac E.coli and enterobacter and anerobes in the culture. These are stool bacteria. Currently on vanco and zosyn- DC vanco.  She has no fever or leucocytosis.Need to make sure  there is adequate source control before we switch to Po antibiotics. Would like to see the wound when the vac is being changed   Anemia -post surgery  Hypothyroidism on synthroid   ___________________________________________________ Discussed with patient, requesting provider

## 2018-09-08 NOTE — Progress Notes (Signed)
Nutrition Follow Up Note   DOCUMENTATION CODES:   Obesity unspecified  INTERVENTION:   Boost Breeze po TID, each supplement provides 250 kcal and 9 grams of protein  Prostat liquid protein PO 30 ml QID with meals, each supplement provides 100 kcal, 15 grams protein.  Juven Fruit Punch BID, each serving provides 95kcal and 2.5g of protein (amino acids glutamine and arginine)  Ocuvite daily for wound healing (provides zinc, vitamin A, vitamin C, Vitamin E, copper, and selenium)  Vitamin C 246m po BID  Pt at refeeding risk; recommend monitor K, Mg and P labs daily until stable.   NUTRITION DIAGNOSIS:   Increased nutrient needs related to wound healing as evidenced by increased estimated needs.  GOAL:   Patient will meet greater than or equal to 90% of their needs  MONITOR:   Diet advancement, Labs, Weight trends, Skin, I & O's  REASON FOR ASSESSMENT:   Malnutrition Screening Tool    ASSESSMENT:   72y/o female with h/o transverse colectomy for unresectable colon polyp on 08/23/18 now admitted for incisional abscess and wound dehiscence. Pt s/p Exploratory laparotomy incisional abscess washout, primary closure of anastomosis leak, placement of phasix mesh 11/17   Met with pt in room today. Pt still slightly confused today. Pt reports continued poor appetite and oral intake. Pt advanced to clear liquid diet yesterday; pt reports eating some broth and jello for breakfast today. Pt's lunch was untouched on her side table. Pt did drink a few sips of the Boost Breeze and Juven today. RD discussed with pt the importance of adequate protein intake needed for wound healing. RD will order Prostat at this requires drinking less volume. Goal is for patient to aim for 100g/day of protein. If pt's appetite does not improve, pt may benefit from nutrition support to allow for wound healing. At this point, pt could likely have NGT placement with gastric feedings as long as surgery is ok with  using the gut for feedings; otherwise pt may require TPN. Vitamins ordered to encourage wound healing. No new weight since admit; will order weekly weights. Phosphorus slightly low today. Pt at refeeding risk; recommend monitor K, Mg and P labs daily until stable.   Medications reviewed and include: lovenox, synthroid, protonix, NaCl with KCl + 5% dextrose _0 /hr, zosyn, vancomycin  Labs reviewed: K 4.4 wnl, P 2.3(L), Mg 1.9 wnl Hgb 7.7(L), Hct 25.4(L), MCV 101.6(H)  Diet Order:   Diet Order            Diet clear liquid Room service appropriate? Yes; Fluid consistency: Thin  Diet effective now             EDUCATION NEEDS:   Education needs have been addressed  Skin:  Skin Assessment: Reviewed RN Assessment(incision abdomen )  Last BM:  11/21- 582mvia ostomy   Height:   Ht Readings from Last 1 Encounters:  09/03/18 5' 3.5" (1.613 m)    Weight:   Wt Readings from Last 1 Encounters:  09/03/18 81.6 kg    Ideal Body Weight:  53.4 kg  BMI:  Body mass index is 31.39 kg/m.  Estimated Nutritional Needs:   Kcal:  1700-2000kcal/day   Protein:  90-97g/day   Fluid:  >1.6L/day   CaKoleen DistanceS, RD, LDN Pager #- [phone]#- [id number] Hours Pager: [phone]

## 2018-09-08 NOTE — Consult Note (Signed)
[place] Nurse wound consult note Patient receiving care in [facility]. I spoke with Dr. Lysle Pearl via telephone to discuss NPWT and VAC dressing changes to abdominal surgical wound.  Specifically, with this being the first dressing change post surgery on 09/06/18, that a member of the [place] team would need to be there.  Dr. Lysle Pearl explained he is in surgery all day, and at some point today he will see the patient and remove the VAC dressing placed in the OR.  It was agreed that at that time, a saline moistened gauze will be placed by him or the primary RN, and I will be paged to come and place the new VAC dressing.  I have provided this information to the patient's primary RN, Elita Quick, along with my pager number.  White foam dressings were in the patient's supply drawer in her room yesterday. Val Riles, RN, MSN, CWOCN, CNS-BC, pager [phone]

## 2018-09-08 NOTE — Consult Note (Signed)
Pharmacy Antibiotic Note  Cassie Carlson is a 72 y.o. female admitted on 09/03/2018 with incisional abscess and wound dehiscence. Patient with has recent transverse colectomy. Pharmacy has been consulted for vancomycin and zosyn dosing. Patient received vancomycin 1g IV and Zosyn 3.375g IV x 1 dose in ED prior exploratory laparotomy incisional abscess washout.  Plan: 11/21 @ 0430 VT 21.0 mcg/mL drawn appriopriately. Patient's Scr 0.78 >> 0.84 trended up a bit. Will decrease dose to vanc 500 mg IV q12h starting 11/21 @ 1700 to prevent accumulation. Css 15 mcg/mL. Will check vanc trough 11/23 @ 0400 prior to 4th dose.  Height: 5' 3.5" (161.3 cm) Weight: 180 lb (81.6 kg) IBW/kg (Calculated) : 53.55  Temp (24hrs), Avg:98.1 F (36.7 C), Min:97.7 F (36.5 C), Max:98.3 F (36.8 C)  Recent Labs  Lab 09/03/18 1740 09/04/18 0921 09/05/18 0449 09/05/18 1622 09/06/18 0414 09/07/18 0336 09/08/18 0434  WBC  --  15.8* 12.4*  --  9.2 10.6* 8.2  CREATININE  --  0.69 0.87  --  0.78 0.78 0.84  LATICACIDVEN 1.2  --   --   --   --   --   --   VANCOTROUGH  --   --   --  15  --   --  21*    Estimated Creatinine Clearance: 60.1 mL/min (by C-G formula based on SCr of 0.84 mg/dL).    Allergies  Allergen Reactions  . Asa [Aspirin] Other (See Comments)    GI distress/irritaion  . Codeine Other (See Comments)    "Face turned red like a sunburn"  . Ibuprofen Other (See Comments)    GI distress/irritation  . Nsaids Other (See Comments)    GI distress/irritation  . Other Itching    CHG wipes    Antimicrobials this admission: 11/16 Zosyn  >>  11/16 Vancomycin >>   Microbiology results: 11/16 BCx: Pending 11/16 Wound Cx: pending   Thank you for allowing pharmacy to be a part of this patient's care.  Tobie Lords, PharmD, BCPS Clinical Pharmacist 09/08/2018 5:34 AM

## 2018-09-08 NOTE — Progress Notes (Addendum)
Subjective:  CC:  Cassie Carlson is a 72 y.o. female  Hospital stay day 4, 2 Days Post-Op wound washout, phasix mesh removal, and loop ileostomy creation.  S/p exlap, wound washout, Phasix mesh placement over dehiscence from lap converted to open transverse colectomy for unresectable colon polyp.  Noted to have increased feculant drainage POD#3 so returned to OR for above.   HPI: A&Ox3.  No specific complaints.  Slightly confused about progrnosis re: wound care and the type of surgery she underwent.     ROS:  A 5 point review of systems was performed and pertinent positives and negatives noted in HPI.   Objective:      Temp:  [98.1 F (36.7 C)] 98.1 F (36.7 C) (11/21 0455) Pulse Rate:  [74-75] 74 (11/21 0455) Resp:  [20] 20 (11/21 0455) BP: (123-139)/(56-65) 139/65 (11/21 0455) SpO2:  [97 %-100 %] 100 % (11/21 0455)     Height: 5' 3.5" (161.3 cm) Weight: 81.6 kg BMI (Calculated): 31.38   Intake/Output this shift:  Total I/O In: -  Out: 1150 [Urine:1000; Drains:50; Stool:100]       Constitutional :  alert, cooperative, appears stated age and no distress  Respiratory:  clear to auscultation bilaterally  Cardiovascular:  regular rate and rhythm  Gastrointestinal: soft, wound vac in place with some brown discharge.  similar output from blake drain now.  ostomy noted to have yellow -green output 42ml according to records. With some leakage from the staple side.   Skin: Cool and moist.   Psychiatric: Normal affect, non-agitated, not confused       LABS:  CMP Latest Ref Rng & Units 09/08/2018 09/07/2018 09/06/2018  Glucose 70 - 99 mg/dL 103(H) 157(H) 125(H)  BUN 8 - 23 mg/dL 10 7(L) 7(L)  Creatinine 0.44 - 1.00 mg/dL 0.84 0.78 0.78  Sodium 135 - 145 mmol/L 137 141 138  Potassium 3.5 - 5.1 mmol/L 4.4 4.8 4.2  Chloride 98 - 111 mmol/L 108 112(H) 109  CO2 22 - 32 mmol/L 26 25 25   Calcium 8.9 - 10.3 mg/dL 7.6(L) 7.5(L) 7.2(L)  Total Protein 6.5 - 8.1 g/dL - - -  Total  Bilirubin 0.3 - 1.2 mg/dL - - -  Alkaline Phos 38 - 126 U/L - - -  AST 15 - 41 U/L - - -  ALT 0 - 44 U/L - - -   CBC Latest Ref Rng & Units 09/08/2018 09/07/2018 09/06/2018  WBC 4.0 - 10.5 K/uL 8.2 10.6(H) 9.2  Hemoglobin 12.0 - 15.0 g/dL 7.7(L) 8.0(L) 7.8(L)  Hematocrit 36.0 - 46.0 % 25.4(L) 25.9(L) 25.0(L)  Platelets 150 - 400 K/uL 476(H) 475(H) 441(H)    RADS: N/A Assessment:   exlap, wound washout, Phasix mesh placement over dehiscence from lap converted to open transverse colectomy for unresectable colon polyp.  Noted to have increased feculant drainage POD#3, so returned to OR for wound washout, phasix mesh removal, and loop ileostomy creation.  Wound vac removed and moderate stool noted within wound, much less compared to couple days ago.  Likely residual stool in colon.  No overt spillage, and blake drain working well.  Wound packed with kerlex temporarily until ostomy RN can come in to assess and change vac dressing.  Will monitor output amount for another day from blake drain before advancing diet.  Continue supplements as needed.  Will also place ID consult for abx coverage while the fistula matures through wound

## 2018-09-08 NOTE — Progress Notes (Signed)
Spoke with MD Lysle Pearl PICC orders will be placed for patient

## 2018-09-08 NOTE — Consult Note (Signed)
[place] Nurse wound consult note Patient care provided in [facility].  No family present.  Assisted with [facility] dressing placement by primary RN, Elita Quick, and a nursing student working with [employer].  Patient tolerated the dressing placement very well; required coaching on breathing during process, and pre-medication via IV for pain. The gauze dressing that had been placed into the wound bed when Dr. Lysle Pearl removed the prior [facility] dressing materials, was completely removed. Reason for Consult: NPWT VAC dressing placement Wound type: Surgical wound Measurement: 12 cm x 8 cm x 6 cm with circumferential undermining of variable measurement. Wound bed: Pink granulation tissue with visible, coiled, drain tubing. Drainage (amount, consistency, odor) Brown drainage in cannister. Periwound: Intact, normal color and texture skin. Dressing procedure/placement/frequency: Tues-Thurs-Sat VAC dressing. Entire white foam placed in wound bed and tucked into undermining. Then the majority of a medium black foam was shaped to fit over the white foam. Drape was applied and seal obtained.  Of note, the VAC machine was on "Preveena" setting when therapy was reinitiated.  The settings were changed to [facility] therapy at continuous 125 mmHg.  Elita Quick was present throughout and prepared to report in hand-off to oncoming RN details of foam dressing and placement requirements.  These have been captured in the order I entered earlier today. Monitor the wound area(s) for worsening of condition such as: Signs/symptoms of infection,  Increase in size,  Development of or worsening of odor, Development of pain, or increased pain at the affected locations.  Notify the medical team if any of these develop.  Val Riles, RN, MSN, CWOCN, CNS-BC, pager [phone]

## 2018-09-08 NOTE — Progress Notes (Signed)
   09/08/18 1300  Clinical Encounter Type  Visited With Patient  Visit Type Follow-up;Spiritual support  Recommendations Follow-up as requested.  Spiritual Encounters  Spiritual Needs Emotional  Stress Factors  Patient Stress Factors Health changes   Patient had a difficult time remembering Chaplain (3rd visit) or the content of early encounters. Likewise, it took the patient several minutes to remember details about her medical care. During this encounter, the patient shared her desire to start a garden, work with the earth, and give to the poor. Also, she wants to get stronger and eat well, noting the need to do PT.  Chaplain offered active listening and emotional support.

## 2018-09-09 LAB — PHOSPHORUS: PHOSPHORUS: 3.2 mg/dL (ref 2.5–4.6)

## 2018-09-09 LAB — CBC WITH DIFFERENTIAL/PLATELET
ABS IMMATURE GRANULOCYTES: 0.18 10*3/uL — AB (ref 0.00–0.07)
BASOS PCT: 0 %
Basophils Absolute: 0 10*3/uL (ref 0.0–0.1)
EOS ABS: 0 10*3/uL (ref 0.0–0.5)
EOS PCT: 0 %
HCT: 27.7 % — ABNORMAL LOW (ref 36.0–46.0)
Hemoglobin: 8.6 g/dL — ABNORMAL LOW (ref 12.0–15.0)
Immature Granulocytes: 2 %
Lymphocytes Relative: 17 %
Lymphs Abs: 1.3 10*3/uL (ref 0.7–4.0)
MCH: 30.9 pg (ref 26.0–34.0)
MCHC: 31 g/dL (ref 30.0–36.0)
MCV: 99.6 fL (ref 80.0–100.0)
MONO ABS: 0.7 10*3/uL (ref 0.1–1.0)
MONOS PCT: 9 %
NEUTROS ABS: 5.3 10*3/uL (ref 1.7–7.7)
Neutrophils Relative %: 72 %
PLATELETS: 374 10*3/uL (ref 150–400)
RBC: 2.78 MIL/uL — AB (ref 3.87–5.11)
RDW: 13.3 % (ref 11.5–15.5)
WBC: 7.4 10*3/uL (ref 4.0–10.5)
nRBC: 0 % (ref 0.0–0.2)

## 2018-09-09 LAB — BASIC METABOLIC PANEL
Anion gap: 11 (ref 5–15)
BUN: 9 mg/dL (ref 8–23)
CALCIUM: 8.2 mg/dL — AB (ref 8.9–10.3)
CO2: 25 mmol/L (ref 22–32)
CREATININE: 1 mg/dL (ref 0.44–1.00)
Chloride: 107 mmol/L (ref 98–111)
GFR calc Af Amer: >60 mL/min (ref >60)
GFR, EST NON AFRICAN AMERICAN: 54 mL/min — AB (ref >60)
GLUCOSE: 102 mg/dL — AB (ref 70–99)
Potassium: 4.2 mmol/L (ref 3.5–5.1)
SODIUM: 143 mmol/L (ref 135–145)

## 2018-09-09 LAB — AEROBIC/ANAEROBIC CULTURE (SURGICAL/DEEP WOUND)

## 2018-09-09 LAB — AEROBIC/ANAEROBIC CULTURE W GRAM STAIN (SURGICAL/DEEP WOUND): Gram Stain: NONE SEEN

## 2018-09-09 MED ORDER — SODIUM CHLORIDE 0.9% FLUSH
10.0000 mL | Freq: Two times a day (BID) | INTRAVENOUS | Status: DC
  Administered 2018-09-09: 10 mL
  Administered 2018-09-09: 20 mL
  Administered 2018-09-10 – 2018-09-20 (×15): 10 mL
  Administered 2018-09-21: 20 mL

## 2018-09-09 MED ORDER — INSULIN ASPART 100 UNIT/ML ~~LOC~~ SOLN
0.0000 [IU] | Freq: Four times a day (QID) | SUBCUTANEOUS | Status: AC
  Administered 2018-09-10 – 2018-09-18 (×16): 1 [IU] via SUBCUTANEOUS
  Filled 2018-09-09 (×16): qty 1

## 2018-09-09 MED ORDER — SODIUM CHLORIDE 0.9% FLUSH: 10.0000 mL | INTRAVENOUS | Status: DC | PRN

## 2018-09-09 MED ORDER — TRACE MINERALS CR-CU-MN-SE-ZN 10-1000-500-60 MCG/ML IV SOLN
INTRAVENOUS | Status: AC
  Administered 2018-09-09: 19:00:00 via INTRAVENOUS
  Filled 2018-09-09: qty 960

## 2018-09-09 MED ORDER — FAT EMULSION PLANT BASED 20 % IV EMUL
240.0000 mL | INTRAVENOUS | Status: AC
  Administered 2018-09-09: 240 mL via INTRAVENOUS
  Filled 2018-09-09: qty 240

## 2018-09-09 NOTE — Progress Notes (Signed)
Peripherally Inserted Central Catheter/Midline Placement  The IV Nurse has discussed with the patient and/or persons authorized to consent for the patient, the purpose of this procedure and the potential benefits and risks involved with this procedure.  The benefits include less needle sticks, lab draws from the catheter, and the patient may be discharged home with the catheter. Risks include, but not limited to, infection, bleeding, blood clot (thrombus formation), and puncture of an artery; nerve damage and irregular heartbeat and possibility to perform a PICC exchange if needed/ordered by physician.  Alternatives to this procedure were also discussed.  Bard Power PICC patient education guide, fact sheet on infection prevention and patient information card has been provided to patient /or left at bedside.    PICC/Midline Placement Documentation  PICC Double Lumen 94/49/67 PICC Right Basilic 38 cm 0 cm (Active)  Indication for Insertion or Continuance of Line Administration of hyperosmolar/irritating solutions (i.e. TPN, Vancomycin, etc.) 09/09/2018  1:03 PM  Exposed Catheter (cm) 0 cm 09/09/2018  1:03 PM  Site Assessment Clean;Dry;Intact 09/09/2018  1:03 PM  Lumen #1 Status Flushed;Blood return noted 09/09/2018  1:03 PM  Lumen #2 Status Flushed;Blood return noted 09/09/2018  1:03 PM  Dressing Type Transparent 09/09/2018  1:03 PM  Dressing Status Clean;Dry;Intact;Antimicrobial disc in place 09/09/2018  1:03 PM  Dressing Intervention New dressing 09/09/2018  1:03 PM  Dressing Change Due 09/16/18 09/09/2018  1:03 PM   Telephone consent signed by daughter    Synthia Innocent 09/09/2018, 1:04 PM

## 2018-09-09 NOTE — Consult Note (Signed)
Pharmacy Antibiotic Note  Cassie Carlson is a 72 y.o. female admitted on 09/03/2018 with incisional abscess and wound dehiscence. Patient with has recent transverse colectomy. Pharmacy has been consulted for vancomycin and zosyn dosing.  Patient received vancomycin 1g IV and Zosyn 3.375g IV x 1 dose in ED prior exploratory laparotomy incisional abscess washout.  Vancomycin d/c 11/21 per ID MD  Plan:  Day 7  Continue Zosyn EI 3.375 gm IV q8h   Height: 5' 3.5" (161.3 cm) Weight: 190 lb 14.7 oz (86.6 kg) IBW/kg (Calculated) : 53.55  Temp (24hrs), Avg:98.1 F (36.7 C), Min:97.5 F (36.4 C), Max:98.5 F (36.9 C)  Recent Labs  Lab 09/03/18 1740 09/04/18 0921 09/05/18 0449 09/05/18 1622 09/06/18 0414 09/07/18 0336 09/08/18 0434 09/09/18 0510  WBC  --  15.8* 12.4*  --  9.2 10.6* 8.2  --   CREATININE  --  0.69 0.87  --  0.78 0.78 0.84 1.00  LATICACIDVEN 1.2  --   --   --   --   --   --   --   VANCOTROUGH  --   --   --  15  --   --  21*  --     Estimated Creatinine Clearance: 52 mL/min (by C-G formula based on SCr of 1 mg/dL).    Allergies  Allergen Reactions  . Asa [Aspirin] Other (See Comments)    GI distress/irritaion  . Codeine Other (See Comments)    "Face turned red like a sunburn"  . Ibuprofen Other (See Comments)    GI distress/irritation  . Nsaids Other (See Comments)    GI distress/irritation  . Other Itching    CHG wipes    Antimicrobials this admission: 11/16 Zosyn  >>  11/16 Vancomycin >> 11/21  Microbiology results: 11/16 BCx: Ng 11/16 Wound Cx:  MODERATE ESCHERICHIA COLI - pan sensitve MODERATE ENTEROBACTER CLOACAE - Resis to cefazolin HOLDING FOR POSSIBLE ANAEROBE  CULTURE REINCUBATED FOR BETTER GROWTH   Thank you for allowing pharmacy to be a part of this patient's care.  Noralee Space, PharmD, BCPS Clinical Pharmacist 09/09/2018 9:56 AM

## 2018-09-09 NOTE — Clinical Social Work Note (Signed)
Clinical Social Work Assessment  Patient Details  Name: Cassie Carlson MRN: [id number] Date of Birth: [birth year]  Date of referral:  09/09/18               Reason for consult:  Facility Placement                Permission sought to share information with:  Case Manager, Customer service manager, Family Supports Permission granted to share information::  Yes, Verbal Permission Granted  Name::      SNF  Agency::   [place]   Relationship::     Contact Information:     Housing/Transportation Living arrangements for the past 2 months:  [place] of Information:  Patient Patient Interpreter Needed:  None Criminal Activity/Legal Involvement Pertinent to Current Situation/Hospitalization:  No - Comment as needed Significant Relationships:  Adult Children Lives with:  Self Do you feel safe going back to the place where you live?  Yes Need for family participation in patient care:  Yes (Comment)  Care giving concerns:  Patient lives alone in [place] Worker assessment / plan:  CSW consulted for SNF placement. CSW met with patient to discuss discharge plan. CSW introduced self and explained role. Patient reports that she lives alone. CSW explained PT recommendation of SNF. Patient is in agreement with SNF for rehab and gave permission for bed search. CSW initiated bed search and will give bed offers once received. CSW will follow for discharge planning.   Employment status:  Retired Forensic scientist:  [employer] PT Recommendations:  [place] / Referral to community resources:  [place]  Patient/Family's Response to care:  Patient thanked CSW for assistance   Patient/Family's Understanding of and Emotional Response to Diagnosis, Current Treatment, and Prognosis: Patient in agreement with discharge plan   Emotional Assessment Appearance:  Appears stated age Attitude/Demeanor/Rapport:    Affect (typically  observed):  Accepting, Pleasant Orientation:  Oriented to Self, Oriented to Place, Oriented to  Time Alcohol / Substance use:  Not Applicable Psych involvement (Current and /or in the community):  No (Comment)  Discharge Needs  Concerns to be addressed:  Discharge Planning Concerns Readmission within the last 30 days:  Yes Current discharge risk:  Lives alone Barriers to Discharge:  Continued Medical Work up   [employer], [place] 09/09/2018, 12:11 PM

## 2018-09-09 NOTE — Consult Note (Signed)
[place] Nurse ostomy follow up Patient receiving care in [facility].  No family present. Stoma type/location: RLQ ileostomy Pouch intact, no leakage, producing thin brown effluent.  Patient using a two piece, 2 3/4 inch flat barrier and pouch with barrier ring.  Supplies at bedside.  Case Manager in during interaction. Plans being constructed for SNF at discharge.  Patient is feeling very poorly today, very weak.  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  Skilled nursing care for ostomy, nutritional needs, and wound care at discharge is essential, so I am pleased to hear she has agreed to placement.  I have not enrolled her in the Secure Start Program with [place].  At this juncture, I am not at all sure what product might best meet her pouching needs. Val Riles, RN, MSN, CWOCN, CNS-BC, pager [phone]

## 2018-09-09 NOTE — Care Management Important Message (Signed)
Copy of signed IM left with patient in room.  

## 2018-09-09 NOTE — Progress Notes (Signed)
PHARMACY - ADULT TOTAL PARENTERAL NUTRITION CONSULT NOTE   Pharmacy Consult for TPN electrolyte/glucose management Indication: NPO  Patient Measurements: Height: 5' 3.5" (161.3 cm) Weight: 190 lb 14.7 oz (86.6 kg) IBW/kg (Calculated) : 53.55 TPN AdjBW (KG): 60.6 Body mass index is 33.29 kg/m. Usual Weight:    Assessment: Original colectomy on 09/09/18. Exp.lap 11/17 for abscess.  On 11/19 she went back for Exploratory laparotomy, abdominal wound washout, phasix mesh removal, loop ileostomy creation, wound vac placement. Surgeon note  1.Feculent drainage from the failed primary repair of the original anastomotic leak. 2.Abdominal wound still well contained within the original abscess cavity. Extensive scar tissue buildup within the bowel and the surrounding tissue making it too unsafe to resect the anastomosis   GI: NPO Endo: SSI Insulin requirements in the past 24 hours:  N/A.  11/22- BG 102 Lytes:WNL Renal: Scr 1.00  Crcl 52 Pulm: Cards:  Hepatobil: Neuro: ID: Vanc 11/16 >> 11/21 Zosyn  11/16 >>  Wound  TPN Access: PICC TPN start date: 09/09/18 Nutritional Goals (per RD recommendation on 09/09/18): kCal:1894kcal/day Protein: 100g/day protein Fluid:2252ml volume   Goal TPN rate is 83 ml/hr  Current Nutrition: NPO  Plan:  TPN Clinimix 5/15 with electrolytes at 23ml/hr. 20% lipid emulsion @ 20 ml/hr x 12 hrs/day Additional Electrolytes in TPN: none Add MVI, trace elements, Thiamine 100mg  IV daily x 3 days and Vitamin C 500 mg daily to TPN (09/09/18 is day 1 of Thiamine)  SSI q6h and adjust as needed  IVMF- discontinue when TPN started Monitor TPN labs,  (Per discussion with dietician: If Phos >2.0 and electrolytes WNL on Sat 11/23 can increase TPN to 83 ml/hr, If labs not WNL/low then wait to increase rate till labs OK- ? Nancy Fetter 11/24) F/U electrolytes in am  Mert Dietrick A 09/09/2018,10:05 AM

## 2018-09-09 NOTE — NC FL2 (Addendum)
[place] LEVEL OF CARE SCREENING TOOL     IDENTIFICATION  Patient Name: Cassie Carlson Birthdate: [birth year] Sex: female Admission Date (Current Location): 09/03/2018  [place] and [place] Number:  Engineering geologist and Address:  [facility], [address], [place], [place] [address]      Provider Number: [id number]  Attending Physician Name and Address:  Benjamine Sprague, DO  Relative Name and Phone Number:       Current Level of Care: Hospital Recommended Level of Care: [place] Prior Approval Number:    Date Approved/Denied:   PASRR Number: [id number]  Discharge Plan: SNF    Current Diagnoses: Patient Active Problem List   Diagnosis Date Noted  . Wound infection after surgery 09/04/2018  . Colon cancer ([place]) 08/22/2018  . Genetic testing 07/29/2018  . Polyposis of colon 07/20/2018  . Family history of breast cancer   . Family history of colon cancer   . Family history of prostate cancer   . Cervical radiculopathy 01/01/2015  . Nonspecific (abnormal) findings on radiological and other examination of gastrointestinal tract 08/18/2012    Orientation RESPIRATION BLADDER Height & Weight     Self, Time, Place, Situation  Normal Incontinent Weight: 190 lb 14.7 oz (86.6 kg) Height:  5' 3.5" (161.3 cm)  BEHAVIORAL SYMPTOMS/MOOD NEUROLOGICAL BOWEL NUTRITION STATUS  (none) (none) Colostomy regular  AMBULATORY STATUS COMMUNICATION OF NEEDS Skin   Extensive Assist Verbally (RLQ ileostomy)                       Personal Care Assistance Level of Assistance  Feeding, Bathing, Dressing Bathing Assistance: Limited assistance Feeding assistance: Independent Dressing Assistance: Limited assistance     Functional Limitations Info  Sight, Hearing, Speech Sight Info: Adequate Hearing Info: Adequate Speech Info: Adequate    SPECIAL CARE FACTORS FREQUENCY  PT (By licensed PT), OT (By licensed OT)   Will need  wound vac post discharge                    Contractures Contractures Info: Not present    Additional Factors Info  Code Status, Allergies Code Status Info: DNR Allergies Info: [place] Aspirin, Codeine, Ibuprofen, Nsaids, Other           Current Medications (09/09/2018):  This is the current hospital active medication list Current Facility-Administered Medications  Medication Dose Route Frequency Provider Last Rate Last Dose  . 0.9 %  sodium chloride infusion   Intravenous PRN Lysle Pearl, Isami, DO   Stopped at 09/06/18 1218  . acetaminophen (TYLENOL) tablet 650 mg  650 mg Oral Q6H PRN Sakai, Isami, DO   650 mg at 09/05/18 1523  . artificial tears (LACRILUBE) ophthalmic ointment   Both Eyes Q3H PRN Pen[place], Amy, MD      . celecoxib (CELEBREX) capsule 200 mg  200 mg Oral Daily Sakai, Isami, DO   200 mg at 09/09/18 0851  . dextrose 5 % and 0.45 % NaCl with KCl 40 mEq/L infusion   Intravenous Continuous Sakai, Isami, DO 50 mL/hr at 09/09/18 0852    . enoxaparin (LOVENOX) injection 40 mg  40 mg Subcutaneous Q24H Sakai, Isami, DO   40 mg at 09/09/18 0851  . TPN (CLINIMIX-E) Adult   Intravenous Continuous TPN Sakai, Isami, DO       And  . Fat emulsion 20 % infusion 240 mL  240 mL Intravenous Continuous TPN Sakai, Isami, DO      .  HYDROmorphone (DILAUDID) injection 0.5 mg  0.5 mg Intravenous Q3H PRN Sakai, Isami, DO   0.5 mg at 09/08/18 1039  . Influenza vac split quadrivalent PF (FLUZONE HIGH-DOSE) injection 0.5 mL  0.5 mL Intramuscular Tomorrow-1000 Sakai, Isami, DO      . [START ON 09/10/2018] insulin aspart (novoLOG) injection 0-9 Units  0-9 Units Subcutaneous Q6H Sakai, Isami, DO      . levothyroxine (SYNTHROID, LEVOTHROID) tablet 25 mcg  25 mcg Oral QAC breakfast Sakai, Isami, DO   25 mcg at 09/09/18 0515  . ondansetron (ZOFRAN-ODT) disintegrating tablet 4 mg  4 mg Oral Q6H PRN Lysle Pearl, Isami, DO       Or  . ondansetron (ZOFRAN) injection 4 mg  4 mg Intravenous Q6H PRN Sakai, Isami, DO       . pantoprazole (PROTONIX) injection 40 mg  40 mg Intravenous QHS Sakai, Isami, DO   40 mg at 09/08/18 2339  . piperacillin-tazobactam (ZOSYN) IVPB 3.375 g  3.375 g Intravenous Q8H Sakai, Isami, DO 12.5 mL/hr at 09/09/18 0508 3.375 g at 09/09/18 0508  . pneumococcal 23 valent vaccine (PNU-IMMUNE) injection 0.5 mL  0.5 mL Intramuscular Tomorrow-1000 Sakai, Isami, DO      . polyvinyl alcohol (LIQUIFILM TEARS) 1.4 % ophthalmic solution 1 drop  1 drop Both Eyes PRN Lysle Pearl, Isami, DO   1 drop at 09/06/18 2208  . traMADol (ULTRAM) tablet 50 mg  50 mg Oral Q6H PRN Sakai, Isami, DO   50 mg at 09/07/18 0401  . venlafaxine XR (EFFEXOR-XR) 24 hr capsule 150 mg  150 mg Oral Q breakfast Sakai, Isami, DO   150 mg at 09/09/18 4239     Discharge Medications: Please see discharge summary for a list of discharge medications.  Relevant Imaging Results:  Relevant Lab Results:   Additional Information SSN: [id number]  Annamaria Boots, [place]

## 2018-09-09 NOTE — Progress Notes (Signed)
Nutrition Follow Up Note   DOCUMENTATION CODES:   Obesity unspecified  INTERVENTION:    Initiate Clinimix 5/15 with electrolytes at 47ml/hr + 20% lipids @20ml /hr x 12 hrs/day (Goal rate 70ml/hr once labs stable)   Regimen @ goal rate provides 1894kcal/day, 100g/day protein, 2285ml volume    Add MVI daily   Add trace elements daily    Add IV thiamine 100mg  daily x 3 days  Add Vitamin C 500mg  daily    Pt at high refeeding risk; recommend check P, K, and Mg daily for 3 days after TPN iniatition.    Daily weights   Discontinue IVF per MD  NUTRITION DIAGNOSIS:   Increased nutrient needs related to wound healing as evidenced by increased estimated needs.  GOAL:   Patient will meet greater than or equal to 90% of their needs  MONITOR:   Diet advancement, Labs, Weight trends, Skin, I & O's  REASON FOR ASSESSMENT:   Malnutrition Screening Tool    ASSESSMENT:   72 y/o female with h/o transverse colectomy for unresectable colon polyp on 08/23/18 now admitted for incisional abscess and wound dehiscence. Pt s/p Exploratory laparotomy incisional abscess washout, primary closure of anastomosis leak, placement of phasix mesh 11/17   RD received consult for new TPN. PICC line ordered. Pt likely at high refeeding risk; will monitor electrolytes closely. Daily weights. Will add vitamin C supplementation for wound healing.   Medications reviewed and include: lovenox, synthroid, protonix, NaCl with KCl + 5% dextrose @50ml /hr, zosyn  Labs reviewed: K 4.2 wnl, P 3.2 wnl Mg 1.9 wnl (10/21) Hgb 7.7(L), Hct 25.4(L), MCV 101.6(H)- 11/21  Diet Order:   Diet Order            Diet NPO time specified Except for: Ice Chips, Sips with Meds, Other (See Comments)  Diet effective now             EDUCATION NEEDS:   Education needs have been addressed  Skin:  Skin Assessment: Reviewed RN Assessment(incision abdomen ), VAC  Last BM:  11/22- 161ml via ostomy   Height:   Ht Readings  from Last 1 Encounters:  09/03/18 5' 3.5" (1.613 m)    Weight:   Wt Readings from Last 1 Encounters:  09/08/18 86.6 kg    Ideal Body Weight:  53.4 kg  BMI:  Body mass index is 33.29 kg/m.  Estimated Nutritional Needs:   Kcal:  1700-2000kcal/day   Protein:  90-97g/day   Fluid:  >1.6L/day   Koleen Distance MS, RD, LDN Pager #- [phone] Office#- [phone] After Hours Pager: [phone]

## 2018-09-09 NOTE — Progress Notes (Signed)
Subjective:  CC:  Cassie Carlson is a 72 y.o. female  Hospital stay day 5, 3 Days Post-Op wound washout, phasix mesh removal, and loop ileostomy creation.  S/p exlap, wound washout, Phasix mesh placement over dehiscence from lap converted to open transverse colectomy for unresectable colon polyp.  Noted to have increased feculant drainage POD#3 so returned to OR for above.   HPI: A&Ox3.  No specific complaints.  Still slightly confused about prognosis re: wound care and the type of surgery she underwent.  States she has some discomfort around wound today.   ROS:  A 5 point review of systems was performed and pertinent positives and negatives noted in HPI.   Objective:      Temp:  [97.5 F (36.4 C)-98.5 F (36.9 C)] 97.5 F (36.4 C) (11/22 0518) Pulse Rate:  [71-79] 71 (11/22 0518) Resp:  [16-20] 18 (11/22 0518) BP: (98-125)/(47-59) 125/47 (11/22 0518) SpO2:  [99 %-100 %] 100 % (11/22 0518) Weight:  [86.6 kg] 86.6 kg (11/21 1442)     Height: 5' 3.5" (161.3 cm) Weight: 86.6 kg BMI (Calculated): 33.29   Intake/Output this shift:  53ml from blake over 24hs 236ml in wound vac over 24hrs      Constitutional :  alert, cooperative, appears stated age and no distress  Respiratory:  clear to auscultation bilaterally  Cardiovascular:  regular rate and rhythm  Gastrointestinal: soft, non-tender despite initial report of discomfort around wound vac, which is intact with some brown discharge.  Similar output from blake drain.  ostomy noted to have yellow output.  Minimal leakage from the staple side.   Skin: Cool and moist.   Psychiatric: Normal affect, non-agitated, not confused       LABS:  CMP Latest Ref Rng & Units 09/09/2018 09/08/2018 09/07/2018  Glucose 70 - 99 mg/dL 102(H) 103(H) 157(H)  BUN 8 - 23 mg/dL 9 10 7(L)  Creatinine 0.44 - 1.00 mg/dL 1.00 0.84 0.78  Sodium 135 - 145 mmol/L 143 137 141  Potassium 3.5 - 5.1 mmol/L 4.2 4.4 4.8  Chloride 98 - 111 mmol/L 107 108  112(H)  CO2 22 - 32 mmol/L 25 26 25   Calcium 8.9 - 10.3 mg/dL 8.2(L) 7.6(L) 7.5(L)  Total Protein 6.5 - 8.1 g/dL - - -  Total Bilirubin 0.3 - 1.2 mg/dL - - -  Alkaline Phos 38 - 126 U/L - - -  AST 15 - 41 U/L - - -  ALT 0 - 44 U/L - - -   CBC Latest Ref Rng & Units 09/08/2018 09/07/2018 09/06/2018  WBC 4.0 - 10.5 K/uL 8.2 10.6(H) 9.2  Hemoglobin 12.0 - 15.0 g/dL 7.7(L) 8.0(L) 7.8(L)  Hematocrit 36.0 - 46.0 % 25.4(L) 25.9(L) 25.0(L)  Platelets 150 - 400 K/uL 476(H) 475(H) 441(H)    RADS: N/A Assessment:   exlap, wound washout, Phasix mesh placement over dehiscence from lap converted to open transverse colectomy for unresectable colon polyp.  Noted to have increased feculant drainage POD#3, so returned to OR for wound washout, phasix mesh removal, and loop ileostomy creation.  Stable today.  Notified by RN yesterday she keeps infiltrating IV and PICC requested.  ID also recommended IV abx therapy until source control is obtained.  Because the leak cannot be fixed at this time as previously noted in the Op notes, will need to wait until wound heals through secondary intention, and the leak is no longer visible within the wound.  Due to need of PICC and likely further IV abx use,  will also switch to TPN to allow adequate healing of ileostomy incision site to prevent further leakage from that area as well.  OK to continue gum/hard candy, ice chips in the meantime.

## 2018-09-09 NOTE — Progress Notes (Signed)
Physical Therapy Treatment Patient Details Name: Cassie Carlson MRN: [id number] DOB: 03-25-1946 Today's Date: 09/09/2018    History of Present Illness 72 yo female with transverse colectomy after polyp removal presented with fecal drainage from her wounds, with dehiscense and abcess formation.  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  PMHx:  colon resection, anxiety, dementia, depression, cholecystectomy, hypothyroidism, d&c uterus, cervical spine fusion, L shoulder arthroscopy,     PT Comments    Extra time spent encouraging pt and educating pt on benefits of performing OOB mobility but pt kept declining any OOB mobility with PT (pt reporting she is spending tomorrow with her daughter and she will get OOB next week).  Pt was agreeable to some ex's in bed but still needed encouragement and extra cueing for participation with ex's.  During session pt reporting seeing something in the air and asking if therapist saw it (therapist did not see anything): nurse notified.  Overall pt appearing confused during session (nurse aware).  Pt reporting no pain during session.  Will continue to progress pt with strengthening and progress OOB mobility next session as able.    Follow Up Recommendations  SNF     Equipment Recommendations  Rolling walker with 5" wheels    Recommendations for Other Services       Precautions / Restrictions Precautions Precautions: Fall Precaution Comments: abdominal incision with wound vac; colostomy Restrictions Weight Bearing Restrictions: No    Mobility  Bed Mobility  Pt declined.                Transfers  Pt declined.                  Ambulation/Gait  Pt declined.               Stairs             Wheelchair Mobility    Modified Rankin (Stroke Patients Only)       Balance                                            Cognition  Arousal/Alertness: Awake/alert Behavior During Therapy: WFL for tasks assessed/performed(Decreased motivation) Overall Cognitive Status: No family/caregiver present to determine baseline cognitive functioning(Oriented to person and partially situation)                                 General Comments: Appearing confused      Exercises General Exercises - Lower Extremity Ankle Circles/Pumps: AROM;Strengthening;Both;10 reps;Supine Quad Sets: AROM;Strengthening;Both;10 reps;Supine Short Arc Quad: AROM;Strengthening;Both;Supine(10 reps x2) Heel Slides: AROM;Strengthening;Both;10 reps;Supine Hip ABduction/ADduction: AAROM;Strengthening;Both;10 reps;Supine    General Comments   Nursing cleared pt for participation in physical therapy.  Pt agreeable to limited PT session.       Pertinent Vitals/Pain Pain Assessment: No/denies pain Pain Intervention(s): Limited activity within patient's tolerance;Monitored during session;Repositioned  Vitals (HR and O2 on room air) stable and WFL throughout treatment session.    Home Living                      Prior Function            PT Goals (current goals can now be found in the care plan section) Acute  Rehab PT Goals Patient Stated Goal: to have less pain with mobility PT Goal Formulation: With patient Time For Goal Achievement: 09/22/18 Potential to Achieve Goals: Fair Progress towards PT goals: Progressing toward goals(with LE ex's)    Frequency    Min 2X/week      PT Plan Current plan remains appropriate    Co-evaluation              AM-PAC PT "6 Clicks" Daily Activity  Outcome Measure  Difficulty turning over in bed (including adjusting bedclothes, sheets and blankets)?: Unable Difficulty moving from lying on back to sitting on the side of the bed? : Unable Difficulty sitting down on and standing up from a chair with arms (e.g., wheelchair, bedside commode, etc,.)?: Unable Help needed moving to  and from a bed to chair (including a wheelchair)?: A Lot Help needed walking in hospital room?: A Lot Help needed climbing 3-5 steps with a railing? : Total 6 Click Score: 8    End of Session Equipment Utilized During Treatment: Gait belt Activity Tolerance: Patient tolerated treatment well Patient left: in bed;with call bell/phone within reach;with bed alarm set Nurse Communication: Other (comment)(pt's PT session) PT Visit Diagnosis: Unsteadiness on feet (R26.81);Muscle weakness (generalized) (M62.81);Other abnormalities of gait and mobility (R26.89)     Time: 0881-1031 PT Time Calculation (min) (ACUTE ONLY): 23 min  Charges:  $Therapeutic Exercise: 23-37 mins                     Leitha Bleak, PT 09/09/18, 4:06 PM [phone]

## 2018-09-10 DIAGNOSIS — T8130XA Disruption of wound, unspecified, initial encounter: Secondary | ICD-10-CM

## 2018-09-10 DIAGNOSIS — K635 Polyp of colon: Secondary | ICD-10-CM

## 2018-09-10 LAB — CBC WITH DIFFERENTIAL/PLATELET
Abs Immature Granulocytes: 0.27 10*3/uL — ABNORMAL HIGH (ref 0.00–0.07)
BASOS PCT: 0 %
Basophils Absolute: 0 10*3/uL (ref 0.0–0.1)
EOS ABS: 0 10*3/uL (ref 0.0–0.5)
EOS PCT: 0 %
HCT: 27.2 % — ABNORMAL LOW (ref 36.0–46.0)
Hemoglobin: 8.7 g/dL — ABNORMAL LOW (ref 12.0–15.0)
Immature Granulocytes: 4 %
Lymphocytes Relative: 21 %
Lymphs Abs: 1.5 10*3/uL (ref 0.7–4.0)
MCH: 31.3 pg (ref 26.0–34.0)
MCHC: 32 g/dL (ref 30.0–36.0)
MCV: 97.8 fL (ref 80.0–100.0)
MONO ABS: 0.6 10*3/uL (ref 0.1–1.0)
Monocytes Relative: 9 %
NEUTROS PCT: 66 %
NRBC: 0 % (ref 0.0–0.2)
Neutro Abs: 4.4 10*3/uL (ref 1.7–7.7)
PLATELETS: 483 10*3/uL — AB (ref 150–400)
RBC: 2.78 MIL/uL — AB (ref 3.87–5.11)
RDW: 13.3 % (ref 11.5–15.5)
WBC: 6.8 10*3/uL (ref 4.0–10.5)

## 2018-09-10 LAB — COMPREHENSIVE METABOLIC PANEL
ALK PHOS: 62 U/L (ref 38–126)
ALT: 35 U/L (ref 0–44)
ANION GAP: 7 (ref 5–15)
AST: 46 U/L — ABNORMAL HIGH (ref 15–41)
Albumin: 2.2 g/dL — ABNORMAL LOW (ref 3.5–5.0)
BUN: 8 mg/dL (ref 8–23)
CALCIUM: 8.2 mg/dL — AB (ref 8.9–10.3)
CO2: 27 mmol/L (ref 22–32)
Chloride: 107 mmol/L (ref 98–111)
Creatinine, Ser: 0.86 mg/dL (ref 0.44–1.00)
GFR calc non Af Amer: >60 mL/min (ref >60)
Glucose, Bld: 108 mg/dL — ABNORMAL HIGH (ref 70–99)
POTASSIUM: 3.9 mmol/L (ref 3.5–5.1)
SODIUM: 141 mmol/L (ref 135–145)
Total Bilirubin: 0.4 mg/dL (ref 0.3–1.2)
Total Protein: 5.5 g/dL — ABNORMAL LOW (ref 6.5–8.1)

## 2018-09-10 LAB — GLUCOSE, CAPILLARY
GLUCOSE-CAPILLARY: 128 mg/dL — AB (ref 70–99)
Glucose-Capillary: 125 mg/dL — ABNORMAL HIGH (ref 70–99)
Glucose-Capillary: 126 mg/dL — ABNORMAL HIGH (ref 70–99)
Glucose-Capillary: 128 mg/dL — ABNORMAL HIGH (ref 70–99)

## 2018-09-10 LAB — TRIGLYCERIDES: TRIGLYCERIDES: 125 mg/dL (ref <150)

## 2018-09-10 LAB — MAGNESIUM: MAGNESIUM: 2.1 mg/dL (ref 1.7–2.4)

## 2018-09-10 LAB — PHOSPHORUS: PHOSPHORUS: 3.4 mg/dL (ref 2.5–4.6)

## 2018-09-10 LAB — PREALBUMIN: Prealbumin: 12 mg/dL — ABNORMAL LOW (ref 18–38)

## 2018-09-10 MED ORDER — POTASSIUM CHLORIDE 2 MEQ/ML IV SOLN
INTRAVENOUS | Status: DC
  Administered 2018-09-10 – 2018-09-11 (×2): via INTRAVENOUS
  Filled 2018-09-10 (×4): qty 1000

## 2018-09-10 MED ORDER — FAT EMULSION PLANT BASED 20 % IV EMUL
240.0000 mL | INTRAVENOUS | Status: AC
  Administered 2018-09-10: 240 mL via INTRAVENOUS
  Filled 2018-09-10: qty 240

## 2018-09-10 MED ORDER — TRACE MINERALS CR-CU-MN-SE-ZN 10-1000-500-60 MCG/ML IV SOLN
INTRAVENOUS | Status: AC
  Administered 2018-09-10: 19:00:00 via INTRAVENOUS
  Filled 2018-09-10: qty 1992

## 2018-09-10 MED ORDER — POTASSIUM CHLORIDE 2 MEQ/ML IV SOLN: INTRAVENOUS | Status: DC

## 2018-09-10 NOTE — Progress Notes (Signed)
Cassie Carlson is a 72 y.o. female with a history of polyp of transverse colon which was unresectable by colonoscopy underwent on 08/22/18 laparoscopy which was converted to laparotomy with transverse colon colectomy and primary anastomosis. During that procedure dense adhesions between the omentum colon and mesentery noted and was released. A small splenic laceration was noted and bleeding controlled with packing. Postoperatively patient had some pain control issues requiring some narcotic use, which likely resulted in some altered mental status and postoperative ileus.  Altered mental status improved over time with PRN medications for anxiety and confusion, and gradual weaning of all narcotics.  Postoperative ileus was treated with NG tube decompression and a period of n.p.o.Patient was discharged home on 08/29/18 in stable condition with home health.  On 11/16 patient was brought back to the ED as redness was noted at the surgical site by [facility] and EMS was called. In the ED temp was 103. WBC was 14.6. CT abdomen revealed a 14.4 x 12 x 10 cm predominantly gas-filled cavity extending through a large anterior abdominal wall defect into the subcutaneous fat,  On 11/17  she underwent  Exploratory laparotomy incisional abscess washout, primary closure of anastomosis leak, placement of phasix mesh. The surgeon note is below Opening of the midline wound noted extensive necrotic tissue within the abscess cavity, including a wound dehiscence at the fascial layer, measuring 6 cm x 8 cm.  Deep to the fascia there was some purulent drainage noted for which this was cultured.  Inspection of the area noted extensive scarring of the adjacent bowel, and a slightly visible staple line in the area consistent with the location of the previous colon anastomosis.  From this area, small amounts of feculent material was noted and upon further examination there was a subcentimeter pinpoint hole at the staple line.  This hole was  easily sealed with a figure-of-eight 3-0 silk x1.   Surgical culture grew e.coli, enterobacter and it is still being reincubated for anerobes. No fever since admission   On 11/19 she went back for Exploratory laparotomy , abdominal wound washout, phasix mesh removal, loop ileostomy creation, wound vac placement. Surgeon note   1.Feculent drainage from the failed primary repair of the original anastomotic leak.   2.  Abdominal wound still well contained within the original abscess cavity.  Extensive scar tissue buildup within the bowel and the surrounding tissue making it too unsafe to resect the anastomosis  3.  Loop ileostomy creation and phasic mesh removal.  Clear output from both the distal and proximal side.   The proximal and is on the medial aspect, distal end on lateral aspect of wound.        4.  Adequate hemostasis. 5.  No further feculent drainage from the area at end of procedure  Subjective Stable No fever    Objective:  VITALS:  BP 140/64 (BP Location: Left Arm)   Pulse 65   Temp 97.9 F (36.6 C) (Oral)   Resp 19   Ht 5' 3.5" (1.613 m)   Wt 92.4 kg   SpO2 95%   BMI 35.52 kg/m  PHYSICAL EXAM:  General: Flat affect, pale Alert, cooperative, no distress,  Head: Normocephalic, without obvious abnormality, atraumatic. Eyes: Conjunctivae clear, anicteric sclerae. Pupils are equal ENT Nares normal. No drainage or sinus tenderness. Lips, mucosa, and tongue normal. No Thrush Neck: Supple, symmetrical, no adenopathy, thyroid: non tender no carotid bruit and no JVD. Back: did not examine Lungs: b/l air entry Heart: s1s2 Abdomen:  Soft, wound vac covering the abd wall dehiscence Ileostomy, JP drain Pictures taken by daughter when wound vac was changed      Extremities: atraumatic, no cyanosis. No edema. No clubbing Skin: No rashes or lesions. Or bruising Lymph: Cervical, supraclavicular normal. Neurologic: Grossly non-focal Pertinent Labs Lab Results CBC      Component Value Date/Time   WBC 6.8 09/10/2018 0652   RBC 2.78 (L) 09/10/2018 0652   HGB 8.7 (L) 09/10/2018 0652   HCT 27.2 (L) 09/10/2018 0652   PLT 483 (H) 09/10/2018 0652   MCV 97.8 09/10/2018 0652   MCH 31.3 09/10/2018 0652   MCHC 32.0 09/10/2018 0652   RDW 13.3 09/10/2018 0652   LYMPHSABS 1.5 09/10/2018 0652   MONOABS 0.6 09/10/2018 0652   EOSABS 0.0 09/10/2018 0652   BASOSABS 0.0 09/10/2018 0652    CMP Latest Ref Rng & Units 09/10/2018 09/09/2018 09/08/2018  Glucose 70 - 99 mg/dL 108(H) 102(H) 103(H)  BUN 8 - 23 mg/dL 8 9 10   Creatinine 0.44 - 1.00 mg/dL 0.86 1.00 0.84  Sodium 135 - 145 mmol/L 141 143 137  Potassium 3.5 - 5.1 mmol/L 3.9 4.2 4.4  Chloride 98 - 111 mmol/L 107 107 108  CO2 22 - 32 mmol/L 27 25 26   Calcium 8.9 - 10.3 mg/dL 8.2(L) 8.2(L) 7.6(L)  Total Protein 6.5 - 8.1 g/dL 5.5(L) - -  Total Bilirubin 0.3 - 1.2 mg/dL 0.4 - -  Alkaline Phos 38 - 126 U/L 62 - -  AST 15 - 41 U/L 46(H) - -  ALT 0 - 44 U/L 35 - -      Microbiology: Recent Results (from the past 240 hour(s))  Blood culture (routine x 2)     Status: None   Collection Time: 09/03/18  5:37 PM  Result Value Ref Range Status   Specimen Description BLOOD BLOOD RIGHT WRIST  Final   Special Requests   Final    BOTTLES DRAWN AEROBIC AND ANAEROBIC Blood Culture adequate volume   Culture   Final    NO GROWTH 5 DAYS Performed at [facility], [address] [place], [place] [address]    Report Status 09/08/2018 FINAL  Final  Blood culture (routine x 2)     Status: None   Collection Time: 09/03/18  5:39 PM  Result Value Ref Range Status   Specimen Description BLOOD LEFT ANTECUBITAL  Final   Special Requests   Final    BOTTLES DRAWN AEROBIC AND ANAEROBIC Blood Culture results may not be optimal due to an excessive volume of blood received in culture bottles   Culture   Final    NO GROWTH 5 DAYS Performed at [facility], [address], [place], [place] [address]    Report  Status 09/08/2018 FINAL  Final  Aerobic/Anaerobic Culture (surgical/deep wound)     Status: None   Collection Time: 09/03/18 11:58 PM  Result Value Ref Range Status   Specimen Description   Final    WOUND ABDOMEN Performed at [facility], [address] [place], [place] [address]    Special Requests PATIENT ON FOLLOWING [facility] AND ZOSYN  Final   Gram Stain   Final    NO WBC SEEN FEW GRAM POSITIVE COCCI IN PAIRS MODERATE GRAM VARIABLE ROD    Culture   Final    MODERATE ESCHERICHIA COLI MODERATE ENTEROBACTER CLOACAE FEW BACTEROIDES FRAGILIS BETA LACTAMASE POSITIVE Performed at [facility], [place] [address] [place], [place] [address]  Report Status 09/09/2018 FINAL  Final   Organism ID, Bacteria ESCHERICHIA COLI  Final   Organism ID, Bacteria ENTEROBACTER CLOACAE  Final      Susceptibility   Enterobacter cloacae - MIC*    CEFAZOLIN >=64 RESISTANT Resistant     CEFEPIME <=1 SENSITIVE Sensitive     CEFTAZIDIME <=1 SENSITIVE Sensitive     CEFTRIAXONE <=1 SENSITIVE Sensitive     CIPROFLOXACIN <=0.25 SENSITIVE Sensitive     GENTAMICIN <=1 SENSITIVE Sensitive     IMIPENEM 4 SENSITIVE Sensitive     TRIMETH/SULFA <=20 SENSITIVE Sensitive     PIP/TAZO <=4 SENSITIVE Sensitive     * MODERATE ENTEROBACTER CLOACAE   Escherichia coli - MIC*    AMPICILLIN <=2 SENSITIVE Sensitive     CEFAZOLIN <=4 SENSITIVE Sensitive     CEFEPIME <=1 SENSITIVE Sensitive     CEFTAZIDIME <=1 SENSITIVE Sensitive     CEFTRIAXONE <=1 SENSITIVE Sensitive     CIPROFLOXACIN <=0.25 SENSITIVE Sensitive     GENTAMICIN <=1 SENSITIVE Sensitive     IMIPENEM <=0.25 SENSITIVE Sensitive     TRIMETH/SULFA <=20 SENSITIVE Sensitive     AMPICILLIN/SULBACTAM <=2 SENSITIVE Sensitive     PIP/TAZO <=4 SENSITIVE Sensitive     Extended ESBL NEGATIVE Sensitive     * MODERATE ESCHERICHIA COLI   IMAGING RESULTS: ? Impression/Recommendation 72 y.o. female with a history of polyp of transverse colon which was  unresectable by colonoscopy underwent on 08/22/18 laparoscopy which was converted to laparotomy with transverse colon colectomy and primary anastomosis. During that procedure dense adhesions between the omentum colon and mesentery noted and was released. A small splenic laceration was noted and bleeding controlled with packing. Postoperatively patient had some pain control issues requiring some narcotic use, which likely resulted in some altered mental status and postoperative ileus.  Altered mental status improved over time with PRN medications for anxiety and confusion, and gradual weaning of all narcotics.  Postoperative ileus was treated with NG tube decompression and a period of n.p.o.Patient was discharged home on 08/29/18 in stable condition with home health.  On 11/16 patient was brought back to the ED as redness was noted at the surgical site by [facility] and EMS was called. In the ED temp was 103. WBC was 14.6. CT abdomen revealed a 14.4 x 12 x 10 cm predominantly gas-filled cavity extending through a large anterior abdominal wall defect into the subcutaneous fat,  On 11/17  she underwent  Exploratory laparotomy incisional abscess washout, primary closure of anastomosis leak, placement of phasix mesh? On 11/19 she went back for Exploratory laparotomy , abdominal wound washout, phasix mesh removal, loop ileostomy creation, wound vac placement. ? ?Intra-abdominal abscess with  abdominal wall dehiscence and  leaking from the transverse colon anastomosis site with fecal soilage of peritoneum  S/p surgeries as above and has a wound vac E.coli and enterobacter and anerobes in the culture.  zosyn She has no fever or leucocytosis.Need to make sure  there is adequate source control before antibiotics cab be discontinued  Anemia -post surgery  Hypothyroidism on synthroid   ___________________________________________________ Discussed with her daughter

## 2018-09-10 NOTE — Progress Notes (Signed)
Ostomy was leaking with MD present in the room. Ostomy changed and stoma powder used. Wound vac was changed at the bedside as well.

## 2018-09-10 NOTE — Plan of Care (Signed)
Patient is currently on TPN and lipids.

## 2018-09-10 NOTE — Progress Notes (Signed)
PHARMACY - ADULT TOTAL PARENTERAL NUTRITION CONSULT NOTE   Pharmacy Consult for TPN electrolyte/glucose management Indication: NPO, ileostomy   Patient Measurements: Height: 5' 3.5" (161.3 cm) Weight: 203 lb 11.3 oz (92.4 kg) IBW/kg (Calculated) : 53.55 TPN AdjBW (KG): 60.6 Body mass index is 35.52 kg/m. Usual Weight:    Assessment: Original colectomy on 09/09/18. Exp.lap 11/17 for abscess.  On 11/19 she went back for Exploratory laparotomy, abdominal wound washout, phasix mesh removal, loop ileostomy creation, wound vac placement. Surgeon note  1.Feculent drainage from the failed primary repair of the original anastomotic leak. 2.Abdominal wound still well contained within the original abscess cavity. Extensive scar tissue buildup within the bowel and the surrounding tissue making it too unsafe to resect the anastomosis   GI: NPO Endo: SSI Insulin requirements in the past 24 hours:  2.   BG 108-128 Lytes:K 3.9 Mag 2.1 Phos 3.4 Ca 8.2 Alb 2.2 Corr Ca 9.6 Renal: Scr 0.86  Crcl 62 Pulm: Cards: TG 125 Hepatobil: Neuro: ID: Vanc 11/16 >> 11/21 Zosyn  11/16 >>  Wound  TPN Access: PICC TPN start date: 09/09/18 Nutritional Goals (per RD recommendation on 09/09/18): kCal:1894kcal/day Protein: 100g/day protein Fluid:2261ml volume   Goal TPN rate is 83 ml/hr  Current Nutrition: NPO  Plan:  Continue TPN Clinimix 5/15 with electrolytes and increase to goal rate of 83 ml/hr. 20% lipid emulsion @ 20 ml/hr x 12 hrs/day Additional Electrolytes in TPN: none Add MVI, trace elements, Thiamine 100mg  IV daily x 3 days and Vitamin C 500 mg daily to TPN (09/10/18 is day 2 of Thiamine)  SSI q6h and adjust as needed  IVMF- ? Restarted. On D51/2 KCL 40 meq @ 50 ml/hr Monitor TPN labs,   F/U electrolytes in am  Evon Lopezperez A 09/10/2018,10:21 AM

## 2018-09-10 NOTE — Consult Note (Signed)
Pharmacy Antibiotic Note  Cassie Carlson is a 72 y.o. female admitted on 09/03/2018 with incisional abscess and wound dehiscence. Patient with has recent transverse colectomy. Pharmacy has been consulted for vancomycin and zosyn dosing.  Patient received vancomycin 1g IV and Zosyn 3.375g IV x 1 dose in ED prior exploratory laparotomy incisional abscess washout.  Vancomycin d/c 11/21 per ID MD  Plan:  Day 8  Continue Zosyn EI 3.375 gm IV q8h   Height: 5' 3.5" (161.3 cm) Weight: 203 lb 11.3 oz (92.4 kg) IBW/kg (Calculated) : 53.55  Temp (24hrs), Avg:97.8 F (36.6 C), Min:97.7 F (36.5 C), Max:97.9 F (36.6 C)  Recent Labs  Lab 09/03/18 1740  09/05/18 1622 09/06/18 0414 09/07/18 0336 09/08/18 0434 09/09/18 0510 09/10/18 0652  WBC  --    < >  --  9.2 10.6* 8.2 7.4 6.8  CREATININE  --    < >  --  0.78 0.78 0.84 1.00 0.86  LATICACIDVEN 1.2  --   --   --   --   --   --   --   VANCOTROUGH  --   --  15  --   --  21*  --   --    < > = values in this interval not displayed.    Estimated Creatinine Clearance: 62.6 mL/min (by C-G formula based on SCr of 0.86 mg/dL).    Allergies  Allergen Reactions  . Asa [Aspirin] Other (See Comments)    GI distress/irritaion  . Codeine Other (See Comments)    "Face turned red like a sunburn"  . Ibuprofen Other (See Comments)    GI distress/irritation  . Nsaids Other (See Comments)    GI distress/irritation  . Other Itching    CHG wipes    Antimicrobials this admission: 11/16 Zosyn  >>  11/16 Vancomycin >> 11/21  Microbiology results: 11/16 BCx: Ng 11/16 Wound Cx:  MODERATE ESCHERICHIA COLI - pan sensitve MODERATE ENTEROBACTER CLOACAE - Resis to cefazolin HOLDING FOR POSSIBLE ANAEROBE  CULTURE REINCUBATED FOR BETTER GROWTH   Thank you for allowing pharmacy to be a part of this patient's care.  Noralee Space, PharmD, BCPS Clinical Pharmacist 09/10/2018 10:43 AM

## 2018-09-10 NOTE — Progress Notes (Signed)
4 Days Post-Op   Subjective/Chief Complaint: Patient of Dr. Ines Bloomer seen at bedside. Daughter is present.  RN planning to change VAC today.  No significant changes overnight.   Objective: Vital signs in last 24 hours: Temp:  [97.7 F (36.5 C)-97.9 F (36.6 C)] 97.9 F (36.6 C) (11/23 0553) Pulse Rate:  [64-69] 65 (11/23 1217) Resp:  [17-19] 19 (11/23 0553) BP: (110-140)/(44-64) 140/64 (11/23 1217) SpO2:  [95 %-100 %] 95 % (11/23 1217) Weight:  [92.4 kg] 92.4 kg (11/23 0500) Last BM Date: 09/09/18  Intake/Output from previous day: 11/22 0701 - 11/23 0700 In: 1224.5 [I.V.:1090.1; IV Piggyback:134.5] Out: 2395 [Urine:2050; Drains:145; Stool:200] Intake/Output this shift: Total I/O In: 334.1 [I.V.:275; IV Piggyback:59.1] Out: 32 [Urine:450; Drains:55; Stool:10]  General appearance: appears older than stated age and no distress GI: wound VAC in place with good suction. Drainage is mucofeculent.  Large Blake drain with similar output.  RLQ ileostomy with dark brown liquid. Staple line adjacent to ostomy is moist with mild erythema.   Lab Results:  Recent Labs    09/09/18 0510 09/10/18 0652  WBC 7.4 6.8  HGB 8.6* 8.7*  HCT 27.7* 27.2*  PLT 374 483*   BMET Recent Labs    09/09/18 0510 09/10/18 0652  NA 143 141  K 4.2 3.9  CL 107 107  CO2 25 27  GLUCOSE 102* 108*  BUN 9 8  CREATININE 1.00 0.86  CALCIUM 8.2* 8.2*   PT/INR No results for input(s): LABPROT, INR in the last 72 hours. ABG No results for input(s): PHART, HCO3 in the last 72 hours.  Invalid input(s): PCO2, PO2  Studies/Results: [place] Ekg Site Rite  Result Date: 09/08/2018 If Site Rite image not attached, placement could not be confirmed due to current cardiac rhythm.   Anti-infectives: Anti-infectives (From admission, onward)   Start     Dose/Rate Route Frequency Ordered Stop   09/08/18 1700  vancomycin (VANCOCIN) 500 mg in sodium chloride 0.9 % 100 mL IVPB  Status:  Discontinued     500  mg 100 mL/hr over 60 Minutes Intravenous Every 12 hours 09/08/18 0533 09/08/18 1949   09/04/18 0500  vancomycin (VANCOCIN) IVPB 750 mg/150 ml premix  Status:  Discontinued     750 mg 150 mL/hr over 60 Minutes Intravenous Every 12 hours 09/04/18 0215 09/08/18 0533   09/04/18 0300  piperacillin-tazobactam (ZOSYN) IVPB 3.375 g     3.375 g 12.5 mL/hr over 240 Minutes Intravenous Every 8 hours 09/04/18 0215     09/03/18 1830  vancomycin (VANCOCIN) IVPB 1000 mg/200 mL premix     1,000 mg 200 mL/hr over 60 Minutes Intravenous  Once 09/03/18 1816 09/03/18 2019   09/03/18 1830  piperacillin-tazobactam (ZOSYN) IVPB 3.375 g     3.375 g 100 mL/hr over 30 Minutes Intravenous  Once 09/03/18 1816 09/03/18 1919      Assessment/Plan: s/p Procedure(s): EXPLORATORY LAPAROTOMY (N/A) ILEOSTOMY (N/A) Dressing change per RN today.  Continue NPO w/TPN Will attempt to contact wound/ostomy RN to address area of moisture, early maceration adjacent to ostomy.  LOS: 6 days    Fredirick Maudlin 09/10/2018

## 2018-09-11 LAB — BASIC METABOLIC PANEL
Anion gap: 7 (ref 5–15)
BUN: 14 mg/dL (ref 8–23)
CALCIUM: 8.2 mg/dL — AB (ref 8.9–10.3)
CO2: 26 mmol/L (ref 22–32)
Chloride: 107 mmol/L (ref 98–111)
Creatinine, Ser: 0.84 mg/dL (ref 0.44–1.00)
GFR calc Af Amer: >60 mL/min (ref >60)
Glucose, Bld: 134 mg/dL — ABNORMAL HIGH (ref 70–99)
Potassium: 4.9 mmol/L (ref 3.5–5.1)
SODIUM: 140 mmol/L (ref 135–145)

## 2018-09-11 LAB — CBC WITH DIFFERENTIAL/PLATELET
Abs Immature Granulocytes: 0.25 10*3/uL — ABNORMAL HIGH (ref 0.00–0.07)
BASOS PCT: 0 %
Basophils Absolute: 0 10*3/uL (ref 0.0–0.1)
EOS PCT: 0 %
Eosinophils Absolute: 0 10*3/uL (ref 0.0–0.5)
HCT: 27.8 % — ABNORMAL LOW (ref 36.0–46.0)
Hemoglobin: 8.5 g/dL — ABNORMAL LOW (ref 12.0–15.0)
Immature Granulocytes: 4 %
Lymphocytes Relative: 24 %
Lymphs Abs: 1.4 10*3/uL (ref 0.7–4.0)
MCH: 30.5 pg (ref 26.0–34.0)
MCHC: 30.6 g/dL (ref 30.0–36.0)
MCV: 99.6 fL (ref 80.0–100.0)
MONO ABS: 0.7 10*3/uL (ref 0.1–1.0)
Monocytes Relative: 11 %
Neutro Abs: 3.5 10*3/uL (ref 1.7–7.7)
Neutrophils Relative %: 61 %
PLATELETS: 381 10*3/uL (ref 150–400)
RBC: 2.79 MIL/uL — AB (ref 3.87–5.11)
RDW: 13.5 % (ref 11.5–15.5)
WBC: 5.8 10*3/uL (ref 4.0–10.5)
nRBC: 0 % (ref 0.0–0.2)

## 2018-09-11 LAB — GLUCOSE, CAPILLARY
GLUCOSE-CAPILLARY: 118 mg/dL — AB (ref 70–99)
GLUCOSE-CAPILLARY: 122 mg/dL — AB (ref 70–99)
GLUCOSE-CAPILLARY: 126 mg/dL — AB (ref 70–99)
GLUCOSE-CAPILLARY: 139 mg/dL — AB (ref 70–99)

## 2018-09-11 LAB — PHOSPHORUS: PHOSPHORUS: 3.6 mg/dL (ref 2.5–4.6)

## 2018-09-11 MED ORDER — FAT EMULSION PLANT BASED 20 % IV EMUL
240.0000 mL | INTRAVENOUS | Status: AC
  Administered 2018-09-11: 240 mL via INTRAVENOUS
  Filled 2018-09-11: qty 240

## 2018-09-11 MED ORDER — KCL IN DEXTROSE-NACL 20-5-0.45 MEQ/L-%-% IV SOLN
INTRAVENOUS | Status: DC
  Administered 2018-09-11 – 2018-09-12 (×2): via INTRAVENOUS
  Filled 2018-09-11 (×2): qty 1000

## 2018-09-11 MED ORDER — TRACE MINERALS CR-CU-MN-SE-ZN 10-1000-500-60 MCG/ML IV SOLN
INTRAVENOUS | Status: AC
  Administered 2018-09-11: 17:00:00 via INTRAVENOUS
  Filled 2018-09-11: qty 1992

## 2018-09-11 MED ORDER — KCL IN DEXTROSE-NACL 40-5-0.45 MEQ/L-%-% IV SOLN
INTRAVENOUS | Status: DC
  Filled 2018-09-11: qty 1000

## 2018-09-11 MED ORDER — INSULIN ASPART 100 UNIT/ML ~~LOC~~ SOLN: 0.0000 [IU] | Freq: Three times a day (TID) | SUBCUTANEOUS | Status: DC

## 2018-09-11 NOTE — Progress Notes (Signed)
LCSW met with patient daughter to present the  bed offers for her Mom. She will consult with patients doctor and make her decision tomorrow.  [employer] LCSW [phone]

## 2018-09-11 NOTE — Progress Notes (Signed)
5 Days Post-Op   Subjective/Chief Complaint: Patient of Dr. Ines Bloomer seen at bedside. Wound vac changed without incident.  She had a "rough night" but feels much better this morning. Planning to sit in a chair later.  Objective: Vital signs in last 24 hours: Temp:  [97.7 F (36.5 C)-98.4 F (36.9 C)] 97.7 F (36.5 C) (11/24 1126) Pulse Rate:  [62-71] 62 (11/24 1126) Resp:  [16-20] 16 (11/24 1126) BP: (107-133)/(46-58) 107/46 (11/24 1126) SpO2:  [97 %-99 %] 99 % (11/24 1126) Weight:  [86.4 kg] 86.4 kg (11/24 0500) Last BM Date: 09/11/18  Intake/Output from previous day: 11/23 0701 - 11/24 0700 In: 1711.4 [I.V.:1554.8; IV Piggyback:156.6] Out: 8315 [Urine:1350; Drains:75; Stool:60] Intake/Output this shift: No intake/output data recorded.  General appearance: appears older than stated age and no distress GI: wound VAC in place with good suction. Drainage is mucofeculent.  Large Blake drain with similar output.  RLQ ileostomy with dark brown liquid. Staple line adjacent to ostomy is dry, with minimal irritation compared to yesterday.  Lab Results:  Recent Labs    09/10/18 0652 09/11/18 0526  WBC 6.8 5.8  HGB 8.7* 8.5*  HCT 27.2* 27.8*  PLT 483* 381   BMET Recent Labs    09/10/18 0652 09/11/18 0526  NA 141 140  K 3.9 4.9  CL 107 107  CO2 27 26  GLUCOSE 108* 134*  BUN 8 14  CREATININE 0.86 0.84  CALCIUM 8.2* 8.2*   PT/INR No results for input(s): LABPROT, INR in the last 72 hours. ABG No results for input(s): PHART, HCO3 in the last 72 hours.  Invalid input(s): PCO2, PO2  Studies/Results: No results found.  Anti-infectives: Anti-infectives (From admission, onward)   Start     Dose/Rate Route Frequency Ordered Stop   09/08/18 1700  vancomycin (VANCOCIN) 500 mg in sodium chloride 0.9 % 100 mL IVPB  Status:  Discontinued     500 mg 100 mL/hr over 60 Minutes Intravenous Every 12 hours 09/08/18 0533 09/08/18 1949   09/04/18 0500  vancomycin (VANCOCIN) IVPB 750  mg/150 ml premix  Status:  Discontinued     750 mg 150 mL/hr over 60 Minutes Intravenous Every 12 hours 09/04/18 0215 09/08/18 0533   09/04/18 0300  piperacillin-tazobactam (ZOSYN) IVPB 3.375 g     3.375 g 12.5 mL/hr over 240 Minutes Intravenous Every 8 hours 09/04/18 0215     09/03/18 1830  vancomycin (VANCOCIN) IVPB 1000 mg/200 mL premix     1,000 mg 200 mL/hr over 60 Minutes Intravenous  Once 09/03/18 1816 09/03/18 2019   09/03/18 1830  piperacillin-tazobactam (ZOSYN) IVPB 3.375 g     3.375 g 100 mL/hr over 30 Minutes Intravenous  Once 09/03/18 1816 09/03/18 1919      Assessment/Plan: s/p Procedure(s): EXPLORATORY LAPAROTOMY (N/A) ILEOSTOMY (N/A) Skin improved with topical treatment by floor RN; suggest wound ostomy RN evaluate.  Continue NPO w/TPN Ambulate as tolerated/able  LOS: 7 days    Fredirick Maudlin 09/11/2018

## 2018-09-11 NOTE — Progress Notes (Signed)
Per Dr. Celine Ahr okay for RN to start order set for sliding scale insulin. Pt is on TPN.

## 2018-09-11 NOTE — Progress Notes (Signed)
PHARMACY - ADULT TOTAL PARENTERAL NUTRITION CONSULT NOTE   Pharmacy Consult for TPN electrolyte/glucose management Indication: NPO, ileostomy   Patient Measurements: Height: 5' 3.5" (161.3 cm) Weight: 190 lb 7.6 oz (86.4 kg) IBW/kg (Calculated) : 53.55 TPN AdjBW (KG): 60.6 Body mass index is 33.21 kg/m. Usual Weight:    Assessment: Original colectomy on 09/09/18. Exp.lap 11/17 for abscess.  On 11/19 she went back for Exploratory laparotomy, abdominal wound washout, phasix mesh removal, loop ileostomy creation, wound vac placement. Surgeon note  1.Feculent drainage from the failed primary repair of the original anastomotic leak. 2.Abdominal wound still well contained within the original abscess cavity. Extensive scar tissue buildup within the bowel and the surrounding tissue making it too unsafe to resect the anastomosis   GI: NPO Endo: SSI Insulin requirements in the past 24 hours:  4    LytesWNL Renal: Scr 0.84  Crcl 62 Pulm: Cards: TG 125 Hepatobil: Neuro: ID: Vanc 11/16 >> 11/21 Zosyn  11/16 >>  Wound  TPN Access: PICC TPN start date: 09/09/18 Nutritional Goals (per RD recommendation on 09/09/18): kCal:1894kcal/day Protein: 100g/day protein Fluid:2271ml volume   Goal TPN rate is 83 ml/hr  Current Nutrition: NPO  Plan:  Continue TPN Clinimix 5/15 with electrolytes @ goal rate of 83 ml/hr. 20% lipid emulsion @ 20 ml/hr x 12 hrs/day Additional Electrolytes in TPN: none Add MVI, trace elements, Thiamine 100mg  IV daily x 3 days and Vitamin C 500 mg daily to TPN (09/11/18 is day 3 of Thiamine)  SSI q6h and adjust as needed  IVMF-  On D51/2 KCL 20 meq @ 50 ml/hr Monitor TPN labs,   F/U electrolytes in am  Cassie Carlson A 09/11/2018,10:52 AM

## 2018-09-12 LAB — MAGNESIUM: MAGNESIUM: 2.4 mg/dL (ref 1.7–2.4)

## 2018-09-12 LAB — CBC WITH DIFFERENTIAL/PLATELET
Abs Immature Granulocytes: 0.32 10*3/uL — ABNORMAL HIGH (ref 0.00–0.07)
BASOS PCT: 0 %
Basophils Absolute: 0 10*3/uL (ref 0.0–0.1)
EOS ABS: 0 10*3/uL (ref 0.0–0.5)
EOS PCT: 0 %
HCT: 27.8 % — ABNORMAL LOW (ref 36.0–46.0)
Hemoglobin: 8.5 g/dL — ABNORMAL LOW (ref 12.0–15.0)
Immature Granulocytes: 5 %
Lymphocytes Relative: 21 %
Lymphs Abs: 1.4 10*3/uL (ref 0.7–4.0)
MCH: 30.7 pg (ref 26.0–34.0)
MCHC: 30.6 g/dL (ref 30.0–36.0)
MCV: 100.4 fL — AB (ref 80.0–100.0)
MONO ABS: 0.7 10*3/uL (ref 0.1–1.0)
MONOS PCT: 10 %
NEUTROS PCT: 64 %
Neutro Abs: 4.1 10*3/uL (ref 1.7–7.7)
PLATELETS: 364 10*3/uL (ref 150–400)
RBC: 2.77 MIL/uL — ABNORMAL LOW (ref 3.87–5.11)
RDW: 13.4 % (ref 11.5–15.5)
WBC: 6.4 10*3/uL (ref 4.0–10.5)
nRBC: 0 % (ref 0.0–0.2)

## 2018-09-12 LAB — GLUCOSE, CAPILLARY
GLUCOSE-CAPILLARY: 114 mg/dL — AB (ref 70–99)
Glucose-Capillary: 121 mg/dL — ABNORMAL HIGH (ref 70–99)
Glucose-Capillary: 124 mg/dL — ABNORMAL HIGH (ref 70–99)
Glucose-Capillary: 125 mg/dL — ABNORMAL HIGH (ref 70–99)
Glucose-Capillary: 96 mg/dL (ref 70–99)

## 2018-09-12 LAB — COMPREHENSIVE METABOLIC PANEL
ALT: 21 U/L (ref 0–44)
AST: 23 U/L (ref 15–41)
Albumin: 2 g/dL — ABNORMAL LOW (ref 3.5–5.0)
Alkaline Phosphatase: 57 U/L (ref 38–126)
Anion gap: 5 (ref 5–15)
BUN: 19 mg/dL (ref 8–23)
CHLORIDE: 107 mmol/L (ref 98–111)
CO2: 28 mmol/L (ref 22–32)
CREATININE: 0.8 mg/dL (ref 0.44–1.00)
Calcium: 8 mg/dL — ABNORMAL LOW (ref 8.9–10.3)
GFR calc Af Amer: >60 mL/min (ref >60)
Glucose, Bld: 129 mg/dL — ABNORMAL HIGH (ref 70–99)
Potassium: 4.7 mmol/L (ref 3.5–5.1)
Sodium: 140 mmol/L (ref 135–145)
TOTAL PROTEIN: 5.3 g/dL — AB (ref 6.5–8.1)
Total Bilirubin: 0.3 mg/dL (ref 0.3–1.2)

## 2018-09-12 LAB — TRIGLYCERIDES: TRIGLYCERIDES: 151 mg/dL — AB (ref <150)

## 2018-09-12 LAB — PHOSPHORUS: PHOSPHORUS: 3.5 mg/dL (ref 2.5–4.6)

## 2018-09-12 LAB — PREALBUMIN: Prealbumin: 12.9 mg/dL — ABNORMAL LOW (ref 18–38)

## 2018-09-12 MED ORDER — TRACE MINERALS CR-CU-MN-SE-ZN 10-1000-500-60 MCG/ML IV SOLN
INTRAVENOUS | Status: AC
  Administered 2018-09-12: 19:00:00 via INTRAVENOUS
  Filled 2018-09-12: qty 1992

## 2018-09-12 MED ORDER — FAT EMULSION PLANT BASED 20 % IV EMUL
240.0000 mL | INTRAVENOUS | Status: AC
  Administered 2018-09-12: 240 mL via INTRAVENOUS
  Filled 2018-09-12: qty 240

## 2018-09-12 NOTE — Care Management Important Message (Signed)
Copy of signed IM left with patient in room.  

## 2018-09-12 NOTE — Progress Notes (Addendum)
Nutrition Follow Up Note   DOCUMENTATION CODES:   Obesity unspecified  INTERVENTION:    Continue Clinimix 5/15 with electrolytes at 7m/hr   Continue 20% lipids _0 /hr x 12 hrs/day    Regimen provides 1894kcal/day, 100g/day protein, 22310mvolume    Continue MVI and trace elements daily    Continue Vitamin C 50084maily in TPN   Daily weights  NUTRITION DIAGNOSIS:   Increased nutrient needs related to wound healing as evidenced by increased estimated needs.  GOAL:   Patient will meet greater than or equal to 90% of their needs  -met with TPN  MONITOR:   Diet advancement, Labs, Weight trends, Skin, I & O's, TPN  ASSESSMENT:   [age] female with h/o transverse colectomy for unresectable colon polyp on 08/23/18 now admitted for incisional abscess and wound dehiscence. Pt s/p Exploratory laparotomy incisional abscess washout, primary closure of anastomosis leak, placement of phasix mesh 11/17   Pt remains on TPN; tolerating well at goal. Refeed labs stable. Pt remains NPO. Ostomy output 250m55m24 hrs. Wound VAC in place. Pt's weight trending back down; pt still ~6lb up from admit weight and is +3.5L on I & Os. IVF discontinued today. Recommend continue TPN until patient is able to meet 100% of her estimated needs via oral intake.   Medications reviewed and include: lovenox, insulin, synthroid, protonix, zosyn  Labs reviewed: K 4.2 wnl, P 3.5 wnl, Mg 2.4 wnl Prealbumin 12(L)- 11/23 Triglycerides 151(H)- 11/25 Hgb 8.5(L), Hct 27.8(L), MCV 100/4(H) cbgs- 122, 126, 118, 125, 124 x 24 hrs  Diet Order:   Diet Order            Diet NPO time specified Except for: Ice Chips, Sips with Meds, Other (See Comments)  Diet effective now             EDUCATION NEEDS:   Education needs have been addressed  Skin:  Skin Assessment: Reviewed RN Assessment(incision abdomen ), VAC, 12 cm x 7.8 cm x 6 cm   Last BM:  11/25- 100ml18m ostomy   Height:   Ht Readings from Last 1  Encounters:  09/03/18 5' 3.5" (1.613 m)    Weight:   Wt Readings from Last 1 Encounters:  09/12/18 79 kg    Ideal Body Weight:  53.4 kg  BMI:  Body mass index is 30.37 kg/m.  Estimated Nutritional Needs:   Kcal:  1700-2000kcal/day   Protein:  90-97g/day   Fluid:  >1.6L/day   CaseyKoleen DistanceRD, LDN Pager #- [phone]#- [phone] Hours Pager: [phone]

## 2018-09-12 NOTE — Consult Note (Signed)
[place] Nurse wound consult note Spoke with surgeon.  Will meet at bedside and change vac and ostomy pouch Tuesday at 1130. No needs this AM. VAC was changed over weekend.  [place] team will follow.  Domenic Moras MSN, RN, FNP-BC [facility]ON Wound, Ostomy, Continence Nurse Pager [phone]

## 2018-09-12 NOTE — Progress Notes (Signed)
Physical Therapy Treatment Patient Details Name: Cassie Carlson MRN: [id number] DOB: 07-26-1946 Today's Date: 09/12/2018    History of Present Illness 72 yo female with transverse colectomy after polyp removal presented with fecal drainage from her wounds, with dehiscense and abcess formation.  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  PMHx:  colon resection, anxiety, dementia, depression, cholecystectomy, hypothyroidism, d&c uterus, cervical spine fusion, L shoulder arthroscopy,     PT Comments    Pt reporting 2/10 abdominal pain beginning and end of session.  Able to sit on edge of bed for about 10 minutes with SBA.  Progressed to standing with min assist and then walking a few feet bed to recliner with RW.  Limited ambulation d/t reports of feeling "woozy" (resolved with sitting rest break); may be related to pt's reports of not getting out of bed for a few days.  Overall demonstrating generalized weakness.  Will continue to progress pt with strengthening and progressive functional mobility per pt tolerance.    Follow Up Recommendations  SNF     Equipment Recommendations  Rolling walker with 5" wheels    Recommendations for Other Services       Precautions / Restrictions Precautions Precautions: Fall Precaution Comments: abdominal incision with wound vac; colostomy; drain Restrictions Weight Bearing Restrictions: No    Mobility  Bed Mobility Overal bed mobility: Needs Assistance Bed Mobility: Supine to Sit     Supine to sit: Supervision;HOB elevated     General bed mobility comments: increased effort and time to perform on own; SBA for line management  Transfers Overall transfer level: Needs assistance Equipment used: Rolling walker (2 wheeled) Transfers: Sit to/from Stand Sit to Stand: Min assist         General transfer comment: assist to initiate stand up to  RW  Ambulation/Gait Ambulation/Gait assistance: Min guard Gait Distance (Feet): 3 Feet(bed to recliner) Assistive device: Rolling walker (2 wheeled)   Gait velocity: decreased   General Gait Details: decreased B step length/foot clearance/heelstrike   Stairs             Wheelchair Mobility    Modified Rankin (Stroke Patients Only)       Balance Overall balance assessment: Needs assistance Sitting-balance support: No upper extremity supported;Feet supported Sitting balance-Leahy Scale: Good Sitting balance - Comments: steady sitting reaching inside BOS   Standing balance support: During functional activity;Bilateral upper extremity supported Standing balance-Leahy Scale: Poor Standing balance comment: pt requiring B UE support with functional mobility                            Cognition Arousal/Alertness: Awake/alert Behavior During Therapy: WFL for tasks assessed/performed Overall Cognitive Status: Within Functional Limits for tasks assessed                                 General Comments: appearing forgetful      Exercises      General Comments   Nursing cleared pt for participation in physical therapy.  Pt agreeable to PT session.      Pertinent Vitals/Pain Pain Assessment: 0-10 Pain Score: 2  Pain Location: surgical site Pain Descriptors / Indicators: Sore Pain Intervention(s): Limited activity within patient's tolerance;Monitored during session;Repositioned  Vitals (HR and O2 on room air) stable and WFL throughout treatment session.    Home Living  Prior Function            PT Goals (current goals can now be found in the care plan section) Acute Rehab PT Goals Patient Stated Goal: to have less pain with mobility PT Goal Formulation: With patient Time For Goal Achievement: 09/22/18 Potential to Achieve Goals: Good Progress towards PT goals: Progressing toward goals     Frequency    Min 2X/week      PT Plan Current plan remains appropriate    Co-evaluation              AM-PAC PT "6 Clicks" Mobility   Outcome Measure  Help needed turning from your back to your side while in a flat bed without using bedrails?: A Little Help needed moving from lying on your back to sitting on the side of a flat bed without using bedrails?: A Little Help needed moving to and from a bed to a chair (including a wheelchair)?: A Little Help needed standing up from a chair using your arms (e.g., wheelchair or bedside chair)?: A Little Help needed to walk in hospital room?: A Little Help needed climbing 3-5 steps with a railing? : A Lot 6 Click Score: 17    End of Session Equipment Utilized During Treatment: Gait belt(up high away from incision/DME) Activity Tolerance: Other (comment)(Limited d/t pt feeling "woozy" after getting to chair) Patient left: in chair;with call bell/phone within reach;with chair alarm set;with family/visitor present;Other (comment)(B heels elevated via pillow) Nurse Communication: Mobility status;Precautions;Other (comment)(Need for purewick to be replaced (NT did not answer phone so left message with unit secretary who reported they would tell NT)) PT Visit Diagnosis: Unsteadiness on feet (R26.81);Muscle weakness (generalized) (M62.81);Other abnormalities of gait and mobility (R26.89)     Time: 7847-8412 PT Time Calculation (min) (ACUTE ONLY): 42 min  Charges:  $Therapeutic Exercise: 8-22 mins $Therapeutic Activity: 23-37 mins                     Leitha Bleak, PT 09/12/18, 3:20 PM [phone]

## 2018-09-12 NOTE — Progress Notes (Signed)
PHARMACY - ADULT TOTAL PARENTERAL NUTRITION CONSULT NOTE   Pharmacy Consult for TPN electrolyte/glucose management Indication: NPO, ileostomy   Patient Measurements: Height: 5' 3.5" (161.3 cm) Weight: 174 lb 3.2 oz (79 kg) IBW/kg (Calculated) : 53.55 TPN AdjBW (KG): 60.6 Body mass index is 30.37 kg/m. Usual Weight:    Assessment: Original colectomy on 09/09/18. Exp.lap 11/17 for abscess.  On 11/19 she went back for Exploratory laparotomy, abdominal wound washout, phasix mesh removal, loop ileostomy creation, wound vac placement. Surgeon note  1.Feculent drainage from the failed primary repair of the original anastomotic leak. 2.Abdominal wound still well contained within the original abscess cavity. Extensive scar tissue buildup within the bowel and the surrounding tissue making it too unsafe to resect the anastomosis   GI: NPO Endo: SSI Insulin requirements in the past 24 hours:  4    Lytes K 4.7, Mag 2.4, Phos 3.5, Ca 8.0, Alb 2.0, CorrCa 9.6 Renal: Scr 0.80  Crcl 62 Cards: TG 125 Hepatobil: ID: Vanc 11/16 >> 11/21 Zosyn  11/16 >>  Wound  TPN Access: PICC TPN start date: 09/09/18 Nutritional Goals (per RD recommendation on 09/09/18): Kcal:  1700-2000kcal/day  Protein:  90-97g/day  Fluid:  >1.6L/day  Regimen at goal will provide: kCal:1894kcal/day, Protein: 100g/day protein, Fluid:2268ml volume   Goal TPN rate is 83 ml/hr  Current Nutrition: NPO except ice chips, sips with meds  Plan:  Continue TPN Clinimix 5/15 with electrolytes @ goal rate of 83 ml/hr. 20% lipid emulsion @ 20 ml/hr x 12 hrs/day Additional Electrolytes in TPN: none Add MVI, trace elements, and Vitamin C 500 mg daily to TPN S/p 3 days thiamine  SSI q6h and adjust as needed Monitor TPN labs,  F/U electrolytes in am  Rayna Sexton L 09/12/2018,8:53 AM

## 2018-09-12 NOTE — Progress Notes (Signed)
Subjective:  CC:  Cassie Carlson is a 72 y.o. female  Hospital stay day 8, 6 Days Post-Op wound washout, phasix mesh removal, and loop ileostomy creation.  S/p exlap, wound washout, Phasix mesh placement over dehiscence from lap converted to open transverse colectomy for unresectable colon polyp.  Noted to have increased feculant drainage POD#3 so returned to OR for above.   HPI: A&Ox3.  Daughter at bedside.  States she was nauseated overnight but resolved with meds.  Pt feeling better today.   ROS:  A 5 point review of systems was performed and pertinent positives and negatives noted in HPI.   Objective:      Temp:  [97.7 F (36.5 C)-98.1 F (36.7 C)] 98 F (36.7 C) (11/25 0838) Pulse Rate:  [62-65] 65 (11/25 0838) Resp:  [16-21] 21 (11/25 0838) BP: (107-125)/(46-64) 110/64 (11/25 0838) SpO2:  [97 %-99 %] 99 % (11/25 0838) Weight:  [79 kg] 79 kg (11/25 0552)     Height: 5' 3.5" (161.3 cm) Weight: 79 kg BMI (Calculated): 30.37   Intake/Output this shift:  6ml from blake over 24hs 297ml in wound vac over 24hrs      Constitutional :  alert, cooperative, appears stated age and no distress  Respiratory:  clear to auscultation bilaterally  Cardiovascular:  regular rate and rhythm  Gastrointestinal: soft, non-tender now.  wound vac intact with some brown discharge.  blake drain recently emptied.  ostomy noted to have brown output.  No leakage from the staple side, which only has very slight erythema confined to the incision line.  .   Skin: Cool and moist.   Psychiatric: Normal affect, non-agitated, not confused       LABS:  CMP Latest Ref Rng & Units 09/12/2018 09/11/2018 09/10/2018  Glucose 70 - 99 mg/dL 129(H) 134(H) 108(H)  BUN 8 - 23 mg/dL 19 14 8   Creatinine 0.44 - 1.00 mg/dL 0.80 0.84 0.86  Sodium 135 - 145 mmol/L 140 140 141  Potassium 3.5 - 5.1 mmol/L 4.7 4.9 3.9  Chloride 98 - 111 mmol/L 107 107 107  CO2 22 - 32 mmol/L 28 26 27   Calcium 8.9 - 10.3 mg/dL  8.0(L) 8.2(L) 8.2(L)  Total Protein 6.5 - 8.1 g/dL 5.3(L) - 5.5(L)  Total Bilirubin 0.3 - 1.2 mg/dL 0.3 - 0.4  Alkaline Phos 38 - 126 U/L 57 - 62  AST 15 - 41 U/L 23 - 46(H)  ALT 0 - 44 U/L 21 - 35   CBC Latest Ref Rng & Units 09/12/2018 09/11/2018 09/10/2018  WBC 4.0 - 10.5 K/uL 6.4 5.8 6.8  Hemoglobin 12.0 - 15.0 g/dL 8.5(L) 8.5(L) 8.7(L)  Hematocrit 36.0 - 46.0 % 27.8(L) 27.8(L) 27.2(L)  Platelets 150 - 400 K/uL 364 381 483(H)    RADS: N/A Assessment:   exlap, wound washout, Phasix mesh placement over dehiscence from lap converted to open transverse colectomy for unresectable colon polyp.  Noted to have increased feculant drainage POD#3, so returned to OR for wound washout, phasix mesh removal, and loop ileostomy creation.  Stable today. ID recommends abx therapy until source control is obtained.  Because the leak cannot be fixed at this time as previously noted in the Op notes, will need to wait until wound heals through secondary intention, and the leak is no longer visible within the wound.  Continue TPN to allow adequate healing of ileostomy incision site to prevent further leakage from that area as well.  OK to continue gum/hard candy, ice chips in the meantime.  Will attempt coordination with wound care nurse to assess wound and ostomy site at same time.  Possible staple and/or bridge removal tomorrow depending on how wound looks.  Will also ask ID if switching to oral abx will be feasible in planning for discharge.

## 2018-09-13 LAB — BASIC METABOLIC PANEL
Anion gap: 6 (ref 5–15)
BUN: 22 mg/dL (ref 8–23)
CALCIUM: 8.2 mg/dL — AB (ref 8.9–10.3)
CO2: 29 mmol/L (ref 22–32)
Chloride: 103 mmol/L (ref 98–111)
Creatinine, Ser: 0.82 mg/dL (ref 0.44–1.00)
Glucose, Bld: 122 mg/dL — ABNORMAL HIGH (ref 70–99)
Potassium: 4.4 mmol/L (ref 3.5–5.1)
Sodium: 138 mmol/L (ref 135–145)

## 2018-09-13 LAB — GLUCOSE, CAPILLARY
GLUCOSE-CAPILLARY: 112 mg/dL — AB (ref 70–99)
GLUCOSE-CAPILLARY: 137 mg/dL — AB (ref 70–99)
Glucose-Capillary: 108 mg/dL — ABNORMAL HIGH (ref 70–99)
Glucose-Capillary: 109 mg/dL — ABNORMAL HIGH (ref 70–99)
Glucose-Capillary: 110 mg/dL — ABNORMAL HIGH (ref 70–99)

## 2018-09-13 LAB — CBC WITH DIFFERENTIAL/PLATELET
ABS IMMATURE GRANULOCYTES: 0.27 10*3/uL — AB (ref 0.00–0.07)
BASOS ABS: 0 10*3/uL (ref 0.0–0.1)
BASOS PCT: 0 %
EOS ABS: 0 10*3/uL (ref 0.0–0.5)
Eosinophils Relative: 0 %
HEMATOCRIT: 28.6 % — AB (ref 36.0–46.0)
Hemoglobin: 9.1 g/dL — ABNORMAL LOW (ref 12.0–15.0)
IMMATURE GRANULOCYTES: 3 %
LYMPHS ABS: 1.6 10*3/uL (ref 0.7–4.0)
Lymphocytes Relative: 20 %
MCH: 31 pg (ref 26.0–34.0)
MCHC: 31.8 g/dL (ref 30.0–36.0)
MCV: 97.3 fL (ref 80.0–100.0)
Monocytes Absolute: 0.7 10*3/uL (ref 0.1–1.0)
Monocytes Relative: 8 %
NEUTROS ABS: 5.6 10*3/uL (ref 1.7–7.7)
NEUTROS PCT: 69 %
NRBC: 0 % (ref 0.0–0.2)
PLATELETS: 348 10*3/uL (ref 150–400)
RBC: 2.94 MIL/uL — ABNORMAL LOW (ref 3.87–5.11)
RDW: 13.2 % (ref 11.5–15.5)
WBC: 8.2 10*3/uL (ref 4.0–10.5)

## 2018-09-13 LAB — PHOSPHORUS: Phosphorus: 3.5 mg/dL (ref 2.5–4.6)

## 2018-09-13 LAB — MAGNESIUM: Magnesium: 2.5 mg/dL — ABNORMAL HIGH (ref 1.7–2.4)

## 2018-09-13 MED ORDER — TRACE MINERALS CR-CU-MN-SE-ZN 10-1000-500-60 MCG/ML IV SOLN
INTRAVENOUS | Status: AC
  Administered 2018-09-13: 19:00:00 via INTRAVENOUS
  Filled 2018-09-13: qty 1992

## 2018-09-13 MED ORDER — FAT EMULSION PLANT BASED 20 % IV EMUL
240.0000 mL | INTRAVENOUS | Status: AC
  Administered 2018-09-13: 240 mL via INTRAVENOUS
  Filled 2018-09-13: qty 240

## 2018-09-13 NOTE — Consult Note (Signed)
Pharmacy Antibiotic Note  Cassie Carlson is a 72 y.o. female admitted on 09/03/2018 with incisional abscess and wound dehiscence. Patient with has recent transverse colectomy. Pharmacy has been consulted for vancomycin and zosyn dosing.  Patient received vancomycin 1g IV and Zosyn 3.375g IV x 1 dose in ED prior exploratory laparotomy incisional abscess washout.  Vancomycin d/c 11/21 per ID MD  Plan:  Day 10  Continue Zosyn EI 3.375 gm IV q8h   Height: 5' 3.5" (161.3 cm) Weight: 174 lb 3.2 oz (79 kg) IBW/kg (Calculated) : 53.55  Temp (24hrs), Avg:98.1 F (36.7 C), Min:97.8 F (36.6 C), Max:98.5 F (36.9 C)  Recent Labs  Lab 09/08/18 0434 09/09/18 0510 09/10/18 0652 09/11/18 0526 09/12/18 0443 09/13/18 0616  WBC 8.2 7.4 6.8 5.8 6.4 8.2  CREATININE 0.84 1.00 0.86 0.84 0.80 0.82  VANCOTROUGH 21*  --   --   --   --   --     Estimated Creatinine Clearance: 60.6 mL/min (by C-G formula based on SCr of 0.82 mg/dL).    Allergies  Allergen Reactions  . Asa [Aspirin] Other (See Comments)    GI distress/irritaion  . Codeine Other (See Comments)    "Face turned red like a sunburn"  . Ibuprofen Other (See Comments)    GI distress/irritation  . Nsaids Other (See Comments)    GI distress/irritation  . Other Itching    CHG wipes    Antimicrobials this admission: 11/16 Zosyn  >>  11/16 Vancomycin >> 11/21  Microbiology results: 11/16 BCx: Ng 11/16 Wound Cx:  MODERATE ESCHERICHIA COLI - pan sensitve MODERATE ENTEROBACTER CLOACAE - Resis to cefazolin HOLDING FOR POSSIBLE ANAEROBE  CULTURE REINCUBATED FOR BETTER GROWTH   Thank you for allowing pharmacy to be a part of this patient's care.  Forrest Moron, PharmD Clinical Pharmacist 09/13/2018 7:16 AM

## 2018-09-13 NOTE — Clinical Social Work Note (Signed)
Patient's daughter asked CSW to try and ask [facility] again if they could reconsider. CSW contacted Seth Bake at [facility] and she stated currently she does not foresee having any female beds this week or potentially not even next week. CSW informed patient's daughter. Patient's daughter then called CSW an hour or so later and asked if CSW could try [facility] again. CSW has re-sent patient's referral to [facility]. CSW to follow up with patient's daughter. Shela Leff MSW,LcSW [phone]

## 2018-09-13 NOTE — Clinical Social Work Note (Signed)
CSW has been informed that the one facility in [facility] that historical took TPN patients is no longer taking TPN due to cost. CSW contacted Megan in [place] and left message for Dr. Lysle Pearl to inform him of this information.  Shela Leff MSW,LCSW [phone]

## 2018-09-13 NOTE — Progress Notes (Signed)
ID 72 y.o. female with a history of polyp of transverse colon which was unresectable by colonoscopy underwent on 08/22/18 laparoscopy which was converted to laparotomy with transverse colon colectomy and primary anastomosis. During that procedure dense adhesions between the omentum colon and mesentery noted and was released. A small splenic laceration was noted and bleeding controlled with packing. Postoperatively patient had some pain control issues requiring some narcotic use, which likely resulted in some altered mental status and postoperative ileus. Altered mental status improved over time with PRN medications for anxiety and confusion, and gradual weaning of all narcotics. Postoperative ileus was treated with NG tube decompression and a period of n.p.o.Patient was discharged home on 08/29/18 in stable condition with home health.  On 11/16 patient was brought back to the ED as redness was noted at the surgical site by [facility] and EMS was called. In the ED temp was 103. WBC was 14.6. CT abdomen revealed a 14.4 x 12 x 10 cm predominantly gas-filled cavity extending through a large anterior abdominal wall defect into the subcutaneous fat,  On 11/17 she underwent  Exploratory laparotomy incisional abscess washout, primary closure of anastomosis leak, placement of phasixmesh? On 11/19 she went back for Exploratory laparotomy, abdominal wound washout, phasix mesh removal, loop ileostomy creation, wound vac placement. ? ?Intra-abdominal abscess with  abdominal wall dehiscence and  leaking from the transverse colon anastomosis site with fecal soilage of peritoneum  S/p surgeries as above and has a wound vac E.coli and enterobacter and anerobes in the culture. On zosyn- She has no fever or leucocytosis. 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 Will check  The wound with Dr.Sakai during the next change.   Discussed with her daughter

## 2018-09-13 NOTE — Progress Notes (Signed)
Subjective:  CC:  Cassie Carlson is a 72 y.o. female  Hospital stay day 9, 7 Days Post-Op wound washout, phasix mesh removal, and loop ileostomy creation.  S/p exlap, wound washout, Phasix mesh placement over dehiscence from lap converted to open transverse colectomy for unresectable colon polyp.  Noted to have increased feculant drainage POD#3 so returned to OR for above.   HPI: A&Ox3.  Daughter at bedside.  RN states patient having some issues urinating and bladder scan noted 871ml.  WOC RN at bedside for dressing change.  ROS:  A 5 point review of systems was performed and pertinent positives and negatives noted in HPI.   Objective:      Temp:  [98.2 F (36.8 C)-98.5 F (36.9 C)] 98.2 F (36.8 C) (11/26 0425) Pulse Rate:  [66-71] 71 (11/26 0425) Resp:  [18-20] 20 (11/26 0425) BP: (128)/(66-68) 128/66 (11/26 0425) SpO2:  [98 %] 98 % (11/26 0425)     Height: 5' 3.5" (161.3 cm) Weight: 79 kg BMI (Calculated): 30.37   Intake/Output this shift:  61ml from blake over 24hs 238ml in wound vac over 24hrs      Constitutional :  alert, cooperative, appears stated age and no distress  Respiratory:  clear to auscultation bilaterally  Cardiovascular:  regular rate and rhythm  Gastrointestinal: soft, non-tender.  wound noted to have healthy granulation tissue with obvious colon mucosa with patent opening noted at superior aspect.  Healthy and easily differentiaed from surrounding granulation tissue.  Minimal feculant drainage, some mucus drainage noted within wound.  Blake drain in place.  No further tunneling     . Ostomy site noted to have succuss underneath staple line, so staples removed and cleared.  The pocket seems to be separate from the ostomy site despite it's adjacent location.  Skin: Cool and moist.   Psychiatric: Normal affect, non-agitated, not confused       LABS:  CMP Latest Ref Rng & Units 09/13/2018 09/12/2018 09/11/2018  Glucose 70 - 99 mg/dL 122(H) 129(H) 134(H)   BUN 8 - 23 mg/dL 22 19 14   Creatinine 0.44 - 1.00 mg/dL 0.82 0.80 0.84  Sodium 135 - 145 mmol/L 138 140 140  Potassium 3.5 - 5.1 mmol/L 4.4 4.7 4.9  Chloride 98 - 111 mmol/L 103 107 107  CO2 22 - 32 mmol/L 29 28 26   Calcium 8.9 - 10.3 mg/dL 8.2(L) 8.0(L) 8.2(L)  Total Protein 6.5 - 8.1 g/dL - 5.3(L) -  Total Bilirubin 0.3 - 1.2 mg/dL - 0.3 -  Alkaline Phos 38 - 126 U/L - 57 -  AST 15 - 41 U/L - 23 -  ALT 0 - 44 U/L - 21 -   CBC Latest Ref Rng & Units 09/13/2018 09/12/2018 09/11/2018  WBC 4.0 - 10.5 K/uL 8.2 6.4 5.8  Hemoglobin 12.0 - 15.0 g/dL 9.1(L) 8.5(L) 8.5(L)  Hematocrit 36.0 - 46.0 % 28.6(L) 27.8(L) 27.8(L)  Platelets 150 - 400 K/uL 348 364 381    RADS: N/A Assessment:   exlap, wound washout, Phasix mesh placement over dehiscence from lap converted to open transverse colectomy for unresectable colon polyp.  Noted to have increased feculant drainage POD#3, so returned to OR for wound washout, phasix mesh removal, and loop ileostomy creation.  Stable today. ID recommends abx therapy until source control is obtained.  Because the leak cannot be fixed at this time as previously noted in the Op notes, will need to wait until wound heals through secondary intention, and the leak is no longer  visible within the wound.  Continue TPN to allow adequate healing of ileostomy incision site to prevent further leakage from that area as well.  OK to continue gum/hard candy, ice chips in the meantime.  Ostomy incision packed with aquacell Ag and ostomy appliance reapplied with overyling paste.  Bridge removed.  Hope to have wound isolated from ostomy while healing.  The midline wound is continuing to decrease in size and looking healthy.  With clear delineation of the colonic mucosa at this point, will consider trying a delayed closure by approximating the mucosa at bedside if wound remains clean.  Ideally will like to see some serosa around the leak, but could not be noted today.  Plan  discussed with ostomy RN and daughter at bedside.

## 2018-09-13 NOTE — Plan of Care (Signed)
Patient complained of inability to void this a.m.; bladder scan revealed almost 800-ml.  I&O cath performed x1.  Continue to monitor.

## 2018-09-13 NOTE — Progress Notes (Signed)
PHARMACY - ADULT TOTAL PARENTERAL NUTRITION CONSULT NOTE   Pharmacy Consult for TPN electrolyte/glucose management Indication: NPO, ileostomy   Patient Measurements: Height: 5' 3.5" (161.3 cm) Weight: 174 lb 3.2 oz (79 kg) IBW/kg (Calculated) : 53.55 TPN AdjBW (KG): 60.6 Body mass index is 30.37 kg/m. Usual Weight:    Assessment: Original colectomy on 09/09/18. Exp.lap 11/17 for abscess.  On 11/19 she went back for Exploratory laparotomy, abdominal wound washout, phasix mesh removal, loop ileostomy creation, wound vac placement. Surgeon note  1.Feculent drainage from the failed primary repair of the original anastomotic leak. 2.Abdominal wound still well contained within the original abscess cavity. Extensive scar tissue buildup within the bowel and the surrounding tissue making it too unsafe to resect the anastomosis   GI: NPO Endo: SSI Insulin requirements in the past 24 hours:  1    Lytes K 4.4, Mag 2.5, Phos 3.5, Ca 8.0, Alb 2.0, CorrCa 9.8 Renal: Scr 0.82  Crcl 60.6 Cards: TG 151 Hepatobil: ID: Vanc 11/16 >> 11/21 Zosyn  11/16 >>  Wound  TPN Access: PICC TPN start date: 09/09/18 Nutritional Goals (per RD recommendation on 09/09/18): Kcal:  1700-2000kcal/day  Protein:  90-97g/day  Fluid:  >1.6L/day  Regimen at goal will provide: kCal:1894kcal/day, Protein: 100g/day protein, Fluid:2271ml volume   Goal TPN rate is 83 ml/hr  Current Nutrition: NPO except ice chips, sips with meds  Plan:  Continue TPN Clinimix 5/15 with electrolytes @ goal rate of 83 ml/hr. 20% lipid emulsion @ 20 ml/hr x 12 hrs/day Additional Electrolytes in TPN: none Add MVI, trace elements, and Vitamin C 500 mg daily to TPN S/p 3 days thiamine  SSI q6h and adjust as needed Monitor TPN labs,  F/U electrolytes in am  Forrest Moron, PharmD Clinical Pharmacist 09/13/2018,12:02 PM

## 2018-09-13 NOTE — Clinical Social Work Note (Signed)
CSW spoke with patient's daughter: Basilio Cairo: [phone] this afternoon regarding bed offers for her mother. [name] is not happy with the facilities that have offered. She states she will not place her mother at [facility] or [facility]. She is aware of [facility] offer. When CSW provided the nursing home list to her, she shook her head when looking at the star ratings of most of the facilities. CSW advised that if she did not like the idea of having her placed in  SNF, then she could consider 24/7 hired caregivers with home health in place as well. Patient's daughter felt that this was a viable option and is going to consider it. She stated she could potentially pay for 24/7 care as she did this on patient's last discharge from the hospital. She stated she could not offered an RN though. RN CM to provide her with agencies to consider for 24/7 care.  Shela Leff MSW,LCSW [phone]

## 2018-09-13 NOTE — Consult Note (Signed)
[place] Nurse wound follow up Premedicated for pain with IV Dilaudid.  Tolerated procedure well Dr Lysle Pearl at bedside during NPWT [facility]) dressing change and removed ostomy retention and staples to RLQ incision, revealing purulence and wound that will need ongoing care with ostomy pouch changes.  Wound type: Midline abdominal wound and new open wound to RLQ adjacent to ostomy that will now require wound care.  Measurement: Midline:  12 cm x 7.8 cm x 6 cm with drain tubing present in wound bed.  Visible colon in wound bed.  Scant stool present.  Wound bed:see above Drainage (amount, consistency, odor) Moderate serosanguinous drainage in canister  Switched to new canister at this time.  400Ml were present.  Periwound:RLQ ostomy, RLQ drain and RLQ new wound once staple removed.  Dressing procedure/placement/frequency: NPWT with 1 piece white foam and 1 piece black foam to wound bed.  Seal achieved without difficulty.  Cleanse new wound ro RLQ, adjacent to ostomy with NS.  Pack gently with Aquacel Ag and cover distal end (area beneath ostomy pouch) with barrier ring.  Wound will be partially covered with ostomy barrier (Even when cut off center- see pattern in room) and cover remainder with dry gauze and tape.   [place] Nurse ostomy follow up Stoma type/location: RLQ loop ileostomy, draining liquid green effluent.  Stomal assessment/size: 1" cut pouch off center (using pattern) to provide wound care.  Peristomal assessment: wound to 9 o'clock Treatment options for stomal/peristomal skin: barrier ring.  Cut pouch off center (use pattern) Output liquid green stool Ostomy pouching:2pc. 2 3/4" pouch with barrier ring  Education provided: discussed wound care and rationale for barrier ring and cutting pouch off center.  Enrolled patient in [place] Start Discharge program: Yes [place] team will follow.  Domenic Moras MSN, RN, FNP-BC CWON Wound, Ostomy, Continence Nurse Pager [phone]

## 2018-09-13 NOTE — Progress Notes (Signed)
Physical Therapy Treatment Patient Details Name: Cassie Carlson MRN: [id number] DOB: February 06, 1946 Today's Date: 09/13/2018    History of Present Illness 72 yo female with transverse colectomy after polyp removal presented with fecal drainage from her wounds, with dehiscense and abcess formation.  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  PMHx:  colon resection, anxiety, dementia, depression, cholecystectomy, hypothyroidism, d&c uterus, cervical spine fusion, L shoulder arthroscopy,     PT Comments    To edge of bed with min guard/supervision.  3/10 dizziness.  Given extended time in sitting with tx to decrease dizziness.  Relieved with time.  Stood for exercises as below.  After seated rest she was able to walk to door and back in room with walker and min guard with assist for tubes/lines.  Fatigued with effort but did well and remained in recliner after session.    Will leave recommendations for SNF but if pt continues to progress with mobility, she may be appropriate for home depending on medical/nursing needs if she has support.   Follow Up Recommendations  SNF  -      Equipment Recommendations  Rolling walker with 5" wheels    Recommendations for Other Services       Precautions / Restrictions Precautions Precautions: Fall Precaution Comments: abdominal incision with wound vac; colostomy; drain Restrictions Weight Bearing Restrictions: No    Mobility  Bed Mobility Overal bed mobility: Needs Assistance Bed Mobility: Supine to Sit Rolling: Min assist   Supine to sit: Supervision;Min guard        Transfers Overall transfer level: Needs assistance Equipment used: Rolling walker (2 wheeled) Transfers: Sit to/from Stand Sit to Stand: Min assist         General transfer comment: assist to initiate stand up to RW  Ambulation/Gait Ambulation/Gait assistance: Min guard Gait Distance (Feet): 20  Feet Assistive device: Rolling walker (2 wheeled) Gait Pattern/deviations: Step-through pattern Gait velocity: decreased       Stairs             Wheelchair Mobility    Modified Rankin (Stroke Patients Only)       Balance Overall balance assessment: Needs assistance Sitting-balance support: No upper extremity supported;Feet supported Sitting balance-Leahy Scale: Good     Standing balance support: During functional activity;Bilateral upper extremity supported Standing balance-Leahy Scale: Fair Standing balance comment: pt requiring B UE support with functional mobility                            Cognition Arousal/Alertness: Awake/alert Behavior During Therapy: WFL for tasks assessed/performed Overall Cognitive Status: Within Functional Limits for tasks assessed                                        Exercises Other Exercises Other Exercises: seated ankle pumps, LAQ, 2 x 10 Other Exercises: standing marches, slr x 10    General Comments        Pertinent Vitals/Pain Pain Assessment: 0-10 Pain Score: 1  Pain Location: surgical site Pain Descriptors / Indicators: Sore Pain Intervention(s): Limited activity within patient's tolerance;Monitored during session    Home Living                      Prior Function  PT Goals (current goals can now be found in the care plan section) Progress towards PT goals: Progressing toward goals    Frequency    Min 2X/week      PT Plan Current plan remains appropriate    Co-evaluation              AM-PAC PT "6 Clicks" Mobility   Outcome Measure  Help needed turning from your back to your side while in a flat bed without using bedrails?: A Little Help needed moving from lying on your back to sitting on the side of a flat bed without using bedrails?: A Little Help needed moving to and from a bed to a chair (including a wheelchair)?: A Little Help needed  standing up from a chair using your arms (e.g., wheelchair or bedside chair)?: A Little Help needed to walk in hospital room?: A Little Help needed climbing 3-5 steps with a railing? : A Little 6 Click Score: 18    End of Session Equipment Utilized During Treatment: Gait belt   Patient left: in chair;with call bell/phone within reach;with chair alarm set         Time: 1422-1445 PT Time Calculation (min) (ACUTE ONLY): 23 min  Charges:  $Therapeutic Exercise: 8-22 mins $Therapeutic Activity: 8-22 mins                     Chesley Noon, PTA 09/13/18, 3:22 PM

## 2018-09-13 NOTE — Clinical Social Work Note (Signed)
CSW spoke with patient's daughter: [name]: [phone], this morning via phone. CSW extended bed offers and informed [name] that her mother would not be able to go to a skilled nursing home on TPN within [facility]. CSW extended the bed offers that patient currently has and she is going to think about it and contact CSW with a decision. Shela Leff MSW,LCSW [phone]

## 2018-09-14 LAB — GLUCOSE, CAPILLARY
GLUCOSE-CAPILLARY: 112 mg/dL — AB (ref 70–99)
GLUCOSE-CAPILLARY: 117 mg/dL — AB (ref 70–99)
Glucose-Capillary: 107 mg/dL — ABNORMAL HIGH (ref 70–99)
Glucose-Capillary: 110 mg/dL — ABNORMAL HIGH (ref 70–99)

## 2018-09-14 LAB — CBC WITH DIFFERENTIAL/PLATELET
Abs Immature Granulocytes: 0.24 10*3/uL — ABNORMAL HIGH (ref 0.00–0.07)
Basophils Absolute: 0 10*3/uL (ref 0.0–0.1)
Basophils Relative: 0 %
EOS ABS: 0 10*3/uL (ref 0.0–0.5)
Eosinophils Relative: 0 %
HEMATOCRIT: 30.7 % — AB (ref 36.0–46.0)
Hemoglobin: 9.4 g/dL — ABNORMAL LOW (ref 12.0–15.0)
IMMATURE GRANULOCYTES: 3 %
LYMPHS ABS: 1.8 10*3/uL (ref 0.7–4.0)
Lymphocytes Relative: 21 %
MCH: 30.7 pg (ref 26.0–34.0)
MCHC: 30.6 g/dL (ref 30.0–36.0)
MCV: 100.3 fL — AB (ref 80.0–100.0)
MONOS PCT: 8 %
Monocytes Absolute: 0.7 10*3/uL (ref 0.1–1.0)
Neutro Abs: 5.8 10*3/uL (ref 1.7–7.7)
Neutrophils Relative %: 68 %
Platelets: 338 10*3/uL (ref 150–400)
RBC: 3.06 MIL/uL — ABNORMAL LOW (ref 3.87–5.11)
RDW: 13.4 % (ref 11.5–15.5)
WBC: 8.5 10*3/uL (ref 4.0–10.5)
nRBC: 0 % (ref 0.0–0.2)

## 2018-09-14 LAB — BASIC METABOLIC PANEL
ANION GAP: 9 (ref 5–15)
BUN: 25 mg/dL — ABNORMAL HIGH (ref 8–23)
CALCIUM: 8.5 mg/dL — AB (ref 8.9–10.3)
CHLORIDE: 103 mmol/L (ref 98–111)
CO2: 28 mmol/L (ref 22–32)
Creatinine, Ser: 0.78 mg/dL (ref 0.44–1.00)
GFR calc Af Amer: >60 mL/min (ref >60)
GFR calc non Af Amer: >60 mL/min (ref >60)
GLUCOSE: 126 mg/dL — AB (ref 70–99)
Potassium: 4.4 mmol/L (ref 3.5–5.1)
Sodium: 140 mmol/L (ref 135–145)

## 2018-09-14 LAB — PHOSPHORUS: Phosphorus: 3.4 mg/dL (ref 2.5–4.6)

## 2018-09-14 MED ORDER — TRACE MINERALS CR-CU-MN-SE-ZN 10-1000-500-60 MCG/ML IV SOLN
INTRAVENOUS | Status: AC
  Administered 2018-09-14: 19:00:00 via INTRAVENOUS
  Filled 2018-09-14: qty 1992

## 2018-09-14 MED ORDER — FAT EMULSION PLANT BASED 20 % IV EMUL
240.0000 mL | INTRAVENOUS | Status: AC
  Administered 2018-09-14: 240 mL via INTRAVENOUS
  Filled 2018-09-14: qty 240

## 2018-09-14 NOTE — Progress Notes (Signed)
Physical Therapy Treatment Patient Details Name: Cassie Carlson MRN: [id number] DOB: 1945-11-15 Today's Date: 09/14/2018    History of Present Illness 72 yo female with transverse colectomy after polyp removal presented with fecal drainage from her wounds, with dehiscense and abcess formation.  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  PMHx:  colon resection, anxiety, dementia, depression, cholecystectomy, hypothyroidism, d&c uterus, cervical spine fusion, L shoulder arthroscopy,     PT Comments    Pt able to progress to ambulating around nursing loop with RW CGA.  Overall steady with ambulation using RW.  No c/o pain during session.  HR and O2 sats on room air WFL during session.  PT recommendations updated to HHPT with supervision for mobility/OOB (CM notified).  Will continue to progress pt with strengthening and progressive functional mobility per pt tolerance.    Follow Up Recommendations  Home health PT;Supervision for mobility/OOB     Equipment Recommendations  Rolling walker with 5" wheels    Recommendations for Other Services       Precautions / Restrictions Precautions Precautions: Fall Precaution Comments: abdominal incision with wound vac; colostomy; drain Restrictions Weight Bearing Restrictions: No    Mobility  Bed Mobility Overal bed mobility: Needs Assistance Bed Mobility: Supine to Sit Rolling: Supervision Sidelying to sit: Supervision       General bed mobility comments: mild increased effort and time to perform on own; SBA for line management  Transfers Overall transfer level: Needs assistance Equipment used: Rolling walker (2 wheeled) Transfers: Sit to/from Stand Sit to Stand: Min guard         General transfer comment: increased effort to stand on own with RW  Ambulation/Gait Ambulation/Gait assistance: Min guard Gait Distance (Feet): 240 Feet Assistive device:  Rolling walker (2 wheeled) Gait Pattern/deviations: Step-through pattern Gait velocity: decreased   General Gait Details: steady with RW use   Stairs             Wheelchair Mobility    Modified Rankin (Stroke Patients Only)       Balance Overall balance assessment: Needs assistance Sitting-balance support: No upper extremity supported;Feet supported Sitting balance-Leahy Scale: Good Sitting balance - Comments: steady sitting reaching outside BOS   Standing balance support: No upper extremity supported Standing balance-Leahy Scale: Good Standing balance comment: steady standing reaching within BOS                            Cognition Arousal/Alertness: Awake/alert Behavior During Therapy: WFL for tasks assessed/performed Overall Cognitive Status: Within Functional Limits for tasks assessed                                        Exercises      General Comments General comments (skin integrity, edema, etc.): wound vac, drain site, and colostomy intact beginning/end of session.  Nursing cleared pt for participation in physical therapy.  Pt agreeable to PT session.      Pertinent Vitals/Pain Pain Assessment: No/denies pain Pain Intervention(s): Limited activity within patient's tolerance;Monitored during session;Repositioned    Home Living                      Prior Function            PT Goals (current goals can now be found  in the care plan section) Acute Rehab PT Goals Patient Stated Goal: to improve strength PT Goal Formulation: With patient Time For Goal Achievement: 09/22/18 Potential to Achieve Goals: Good Progress towards PT goals: Progressing toward goals    Frequency    Min 2X/week      PT Plan Discharge plan needs to be updated    Co-evaluation              AM-PAC PT "6 Clicks" Mobility   Outcome Measure  Help needed turning from your back to your side while in a flat bed without using  bedrails?: A Little Help needed moving from lying on your back to sitting on the side of a flat bed without using bedrails?: A Little Help needed moving to and from a bed to a chair (including a wheelchair)?: A Little Help needed standing up from a chair using your arms (e.g., wheelchair or bedside chair)?: A Little Help needed to walk in hospital room?: A Little Help needed climbing 3-5 steps with a railing? : A Little 6 Click Score: 18    End of Session Equipment Utilized During Treatment: Gait belt(up high off of incisions) Activity Tolerance: Patient tolerated treatment well Patient left: in chair;with call bell/phone within reach;with chair alarm set Nurse Communication: Mobility status;Precautions;Other (comment)(Purewick removed) PT Visit Diagnosis: Unsteadiness on feet (R26.81);Muscle weakness (generalized) (M62.81);Other abnormalities of gait and mobility (R26.89)     Time: 1450-1515 PT Time Calculation (min) (ACUTE ONLY): 25 min  Charges:  $Gait Training: 8-22 mins $Therapeutic Activity: 8-22 mins                    Leitha Bleak, PT 09/14/18, 3:55 PM [phone]

## 2018-09-14 NOTE — Progress Notes (Signed)
PHARMACY - ADULT TOTAL PARENTERAL NUTRITION CONSULT NOTE   Pharmacy Consult for TPN electrolyte/glucose management Indication: NPO, ileostomy   Patient Measurements: Height: 5' 3.5" (161.3 cm) Weight: 164 lb 14.5 oz (74.8 kg) IBW/kg (Calculated) : 53.55 TPN AdjBW (KG): 60.6 Body mass index is 28.75 kg/m. Usual Weight:    Assessment: Original colectomy on 09/09/18. Exp.lap 11/17 for abscess.  On 11/19 she went back for Exploratory laparotomy, abdominal wound washout, phasix mesh removal, loop ileostomy creation, wound vac placement. Surgeon note  1.Feculent drainage from the failed primary repair of the original anastomotic leak. 2.Abdominal wound still well contained within the original abscess cavity. Extensive scar tissue buildup within the bowel and the surrounding tissue making it too unsafe to resect the anastomosis   GI: NPO Endo: SSI Insulin requirements in the past 24 hours:  1    Lytes K 4.4, Mag 2.5, Phos 3.4, Ca 8.5, Alb 2.0, CorrCa 10.1 Renal: Scr 0.78  Crcl 60.5 Cards: TG 151 Hepatobil: ID: Vanc 11/16 >> 11/21 Zosyn  11/16 >>  Wound  TPN Access: PICC TPN start date: 09/09/18 Nutritional Goals (per RD recommendation on 09/09/18): Kcal:  1700-2000kcal/day  Protein:  90-97g/day  Fluid:  >1.6L/day  Regimen at goal will provide: kCal:1894kcal/day, Protein: 100g/day protein, Fluid:2229ml volume   Goal TPN rate is 83 ml/hr  Current Nutrition: NPO except ice chips, sips with meds  Plan:  Continue TPN Clinimix 5/15 with electrolytes @ goal rate of 83 ml/hr. 20% lipid emulsion @ 20 ml/hr x 12 hrs/day Additional Electrolytes in TPN: none Add MVI, trace elements, and Vitamin C 500 mg daily to TPN S/p 3 days thiamine  SSI q6h and adjust as needed Monitor TPN labs,  F/U electrolytes in am  Forrest Moron, PharmD Clinical Pharmacist 09/14/2018,10:25 AM

## 2018-09-14 NOTE — Progress Notes (Signed)
   09/14/18 1300  Clinical Encounter Type  Visited With Patient;Family (Daughter-Michelle )  Visit Type Follow-up;Other (Comment) (AD assistance.)  Recommendations Follow-up, if possible.  Spiritual Encounters  Spiritual Needs Emotional   Chaplain responded to a page to meet with the patient and family about securing witnesses and a notary. None of the notaries on-campus can leave their clinical/administrative area to assist the patient. Chaplain asked the patient's daughter, Sharyn Lull, to page the on-call chaplain on 09/16/2018 to try again. The patient and her daughter are frustrated as they were ensured that notaries are always available. Chaplain provided presence, research, and explanations.

## 2018-09-14 NOTE — Care Management Important Message (Signed)
Copy of signed IM left with patient in room.  

## 2018-09-14 NOTE — Progress Notes (Deleted)
Per Dr. Lucky Cowboy pt. has to stay tonight and cont. Heparin  Drip and switch to plavix and aspirin tomorrow am.

## 2018-09-15 LAB — CBC WITH DIFFERENTIAL/PLATELET
Abs Immature Granulocytes: 0.15 10*3/uL — ABNORMAL HIGH (ref 0.00–0.07)
Basophils Absolute: 0 10*3/uL (ref 0.0–0.1)
Basophils Relative: 0 %
EOS PCT: 0 %
Eosinophils Absolute: 0 10*3/uL (ref 0.0–0.5)
HEMATOCRIT: 27.1 % — AB (ref 36.0–46.0)
HEMOGLOBIN: 8.8 g/dL — AB (ref 12.0–15.0)
Immature Granulocytes: 2 %
LYMPHS ABS: 2 10*3/uL (ref 0.7–4.0)
LYMPHS PCT: 21 %
MCH: 31.2 pg (ref 26.0–34.0)
MCHC: 32.5 g/dL (ref 30.0–36.0)
MCV: 96.1 fL (ref 80.0–100.0)
MONO ABS: 0.8 10*3/uL (ref 0.1–1.0)
Monocytes Relative: 8 %
Neutro Abs: 6.5 10*3/uL (ref 1.7–7.7)
Neutrophils Relative %: 69 %
Platelets: 307 10*3/uL (ref 150–400)
RBC: 2.82 MIL/uL — ABNORMAL LOW (ref 3.87–5.11)
RDW: 13.4 % (ref 11.5–15.5)
WBC: 9.5 10*3/uL (ref 4.0–10.5)
nRBC: 0 % (ref 0.0–0.2)

## 2018-09-15 LAB — COMPREHENSIVE METABOLIC PANEL
ALK PHOS: 76 U/L (ref 38–126)
ALT: 21 U/L (ref 0–44)
ANION GAP: 8 (ref 5–15)
AST: 34 U/L (ref 15–41)
Albumin: 2.2 g/dL — ABNORMAL LOW (ref 3.5–5.0)
BILIRUBIN TOTAL: 0.3 mg/dL (ref 0.3–1.2)
BUN: 26 mg/dL — ABNORMAL HIGH (ref 8–23)
CALCIUM: 8.1 mg/dL — AB (ref 8.9–10.3)
CO2: 26 mmol/L (ref 22–32)
Chloride: 103 mmol/L (ref 98–111)
Creatinine, Ser: 0.75 mg/dL (ref 0.44–1.00)
GFR calc non Af Amer: >60 mL/min (ref >60)
Glucose, Bld: 129 mg/dL — ABNORMAL HIGH (ref 70–99)
Potassium: 4.1 mmol/L (ref 3.5–5.1)
Sodium: 137 mmol/L (ref 135–145)
TOTAL PROTEIN: 6 g/dL — AB (ref 6.5–8.1)

## 2018-09-15 LAB — GLUCOSE, CAPILLARY
GLUCOSE-CAPILLARY: 103 mg/dL — AB (ref 70–99)
GLUCOSE-CAPILLARY: 116 mg/dL — AB (ref 70–99)
Glucose-Capillary: 113 mg/dL — ABNORMAL HIGH (ref 70–99)

## 2018-09-15 LAB — PHOSPHORUS: Phosphorus: 3.3 mg/dL (ref 2.5–4.6)

## 2018-09-15 LAB — MAGNESIUM: Magnesium: 2.1 mg/dL (ref 1.7–2.4)

## 2018-09-15 MED ORDER — TRACE MINERALS CR-CU-MN-SE-ZN 10-1000-500-60 MCG/ML IV SOLN
INTRAVENOUS | Status: AC
  Administered 2018-09-15: 17:00:00 via INTRAVENOUS
  Filled 2018-09-15: qty 1992

## 2018-09-15 MED ORDER — FAT EMULSION PLANT BASED 20 % IV EMUL
240.0000 mL | INTRAVENOUS | Status: AC
  Administered 2018-09-15: 240 mL via INTRAVENOUS
  Filled 2018-09-15: qty 240

## 2018-09-15 NOTE — Progress Notes (Signed)
Subjective:  CC:  Cassie Carlson is a 72 y.o. female  Hospital stay day 11, 9 Days Post-Op wound washout, phasix mesh removal, and loop ileostomy creation.  S/p exlap, wound washout, Phasix mesh placement over dehiscence from lap converted to open transverse colectomy for unresectable colon polyp.  Noted to have increased feculant drainage POD#3 so returned to OR for above.   HPI: Some anxiety overnight and difficulty sleeping.  Denies pain.    ROS:  A 5 point review of systems was performed and pertinent positives and negatives noted in HPI.   Objective:      Temp:  [97.6 F (36.4 C)-98.1 F (36.7 C)] 97.6 F (36.4 C) (11/28 1340) Pulse Rate:  [68-74] 68 (11/28 1340) Resp:  [16-20] 16 (11/28 1340) BP: (114-125)/(56-95) 125/56 (11/28 1340) SpO2:  [97 %-98 %] 97 % (11/28 1340)     Height: 5' 3.5" (161.3 cm) Weight: 74.8 kg BMI (Calculated): 28.75   Intake/Output this shift:  Scant mucosal drainage from blake over 4hs 118ml of old blood in wound vac over 24hrs      Constitutional :  alert, cooperative, appears stated age and no distress  Respiratory:  clear to auscultation bilaterally  Cardiovascular:  regular rate and rhythm  Gastrointestinal: wound vac in place.  leakage noted from medial aspect of loop ileostomy site.  aquacel in place with minimal draniage within medial wound once packing removed.  no obvious feculant drainage within the wound.  Minimal skin irritation around ostomy with patent mucosa.  Skin: Cool and moist.   Psychiatric: Normal affect, non-agitated, not confused       LABS:  CMP Latest Ref Rng & Units 09/15/2018 09/14/2018 09/13/2018  Glucose 70 - 99 mg/dL 129(H) 126(H) 122(H)  BUN 8 - 23 mg/dL 26(H) 25(H) 22  Creatinine 0.44 - 1.00 mg/dL 0.75 0.78 0.82  Sodium 135 - 145 mmol/L 137 140 138  Potassium 3.5 - 5.1 mmol/L 4.1 4.4 4.4  Chloride 98 - 111 mmol/L 103 103 103  CO2 22 - 32 mmol/L 26 28 29   Calcium 8.9 - 10.3 mg/dL 8.1(L) 8.5(L) 8.2(L)   Total Protein 6.5 - 8.1 g/dL 6.0(L) - -  Total Bilirubin 0.3 - 1.2 mg/dL 0.3 - -  Alkaline Phos 38 - 126 U/L 76 - -  AST 15 - 41 U/L 34 - -  ALT 0 - 44 U/L 21 - -   CBC Latest Ref Rng & Units 09/15/2018 09/14/2018 09/13/2018  WBC 4.0 - 10.5 K/uL 9.5 8.5 8.2  Hemoglobin 12.0 - 15.0 g/dL 8.8(L) 9.4(L) 9.1(L)  Hematocrit 36.0 - 46.0 % 27.1(L) 30.7(L) 28.6(L)  Platelets 150 - 400 K/uL 307 338 348    RADS: N/A Assessment:   exlap, wound washout, Phasix mesh placement over dehiscence from lap converted to open transverse colectomy for unresectable colon polyp.  Noted to have increased feculant drainage POD#3, so returned to OR for wound washout, phasix mesh removal, and loop ileostomy creation.  Stable today. ID recommends abx therapy until source control is obtained.  Because the leak cannot be fixed at this time as previously noted in the Op notes, will need to wait until wound heals through secondary intention, and the leak is no longer visible within the wound.  Continue TPN to allow adequate healing of ileostomy incision site to prevent further leakage from that area as well.  OK to continue gum/hard candy, ice chips in the meantime.  Ostomy incision repacked with aquacell Ag and ostomy appliance reapplied with additiona  ring paste underneath.  Hope to have wound isolated from ostomy while healing.  The midline wound is continuing to decrease in size and looking healthy.    Still planning on possible closure of leaked anastamosis area tomorrow during wound vac change.

## 2018-09-15 NOTE — Progress Notes (Signed)
PHARMACY - ADULT TOTAL PARENTERAL NUTRITION CONSULT NOTE   Pharmacy Consult for TPN electrolyte/glucose management Indication: NPO, ileostomy   Patient Measurements: Height: 5' 3.5" (161.3 cm) Weight: 164 lb 14.5 oz (74.8 kg) IBW/kg (Calculated) : 53.55 TPN AdjBW (KG): 60.6 Body mass index is 28.75 kg/m. Usual Weight:    Assessment: Original colectomy on 09/09/18. Exp.lap 11/17 for abscess.  On 11/19 she went back for Exploratory laparotomy, abdominal wound washout, phasix mesh removal, loop ileostomy creation, wound vac placement. Surgeon note  1.Feculent drainage from the failed primary repair of the original anastomotic leak. 2.Abdominal wound still well contained within the original abscess cavity. Extensive scar tissue buildup within the bowel and the surrounding tissue making it too unsafe to resect the anastomosis   GI: NPO Endo: SSI Insulin requirements in the past 24 hours:  1    Lytes K 4.4, Mag 2.5, Phos 3.4, Ca 8.5, Alb 2.0, CorrCa 10.1 Renal: Scr 0.78  Crcl 60.5 Cards: TG 151 Hepatobil: ID: Vanc 11/16 >> 11/21 Zosyn  11/16 >>  Wound  TPN Access: PICC TPN start date: 09/09/18 Nutritional Goals (per RD recommendation on 09/09/18): Kcal:  1700-2000kcal/day  Protein:  90-97g/day  Fluid:  >1.6L/day  Regimen at goal will provide: kCal:1894kcal/day, Protein: 100g/day protein, Fluid:2271ml volume   Goal TPN rate is 83 ml/hr  Current Nutrition: NPO except ice chips, sips with meds  Plan:  Continue TPN Clinimix 5/15 with electrolytes @ goal rate of 83 ml/hr. 20% lipid emulsion @ 20 ml/hr x 12 hrs/day Additional Electrolytes in TPN: none Add MVI, trace elements, and Vitamin C 500 mg daily to TPN S/p 3 days thiamine  SSI q6h and adjust as needed Monitor TPN labs,  F/U electrolytes per protocol  Laural Benes, PharmD, BCPS Clinical Pharmacist 09/15/2018,9:22 AM

## 2018-09-15 NOTE — Plan of Care (Signed)
Ostomy bag output monitored and emptied frequently to prevent leaking. The bag was leaking this morning  and Dr. Lysle Pearl was called to assisted with the dressing change the patient daughter requested to see him. The daughter was concern about the leaking getting into the open wound on the right side of the abdomen. The wound was changed by Dr. Lysle Pearl and a new ostomy bag was placed at this time as well. The patient ambulated in the hallway with the assistance of the nurse tech. The patient has been getting up to use the bedside commode. TPN running. IV antibiotics provided.  Problem: Education: Goal: Knowledge of General Education information will improve Description Including pain rating scale, medication(s)/side effects and non-pharmacologic comfort measures Outcome: Progressing   Problem: Health Behavior/Discharge Planning: Goal: Ability to manage health-related needs will improve Outcome: Progressing   Problem: Clinical Measurements: Goal: Ability to maintain clinical measurements within normal limits will improve Outcome: Progressing Goal: Will remain free from infection Outcome: Progressing Goal: Diagnostic test results will improve Outcome: Progressing Goal: Respiratory complications will improve Outcome: Progressing Goal: Cardiovascular complication will be avoided Outcome: Progressing   Problem: Activity: Goal: Risk for activity intolerance will decrease Outcome: Progressing   Problem: Nutrition: Goal: Adequate nutrition will be maintained Outcome: Progressing   Problem: Coping: Goal: Level of anxiety will decrease Outcome: Progressing   Problem: Elimination: Goal: Will not experience complications related to bowel motility Outcome: Progressing Goal: Will not experience complications related to urinary retention Outcome: Progressing   Problem: Pain Managment: Goal: General experience of comfort will improve Outcome: Progressing   Problem: Safety: Goal: Ability to  remain free from injury will improve Outcome: Progressing   Problem: Skin Integrity: Goal: Risk for impaired skin integrity will decrease Outcome: Progressing   Problem: Education: Goal: Required Educational Video(s) Outcome: Progressing   Problem: Clinical Measurements: Goal: Ability to maintain clinical measurements within normal limits will improve Outcome: Progressing Goal: Postoperative complications will be avoided or minimized Outcome: Progressing   Problem: Skin Integrity: Goal: Demonstration of wound healing without infection will improve Outcome: Progressing

## 2018-09-16 LAB — BASIC METABOLIC PANEL
Anion gap: 8 (ref 5–15)
BUN: 24 mg/dL — ABNORMAL HIGH (ref 8–23)
CHLORIDE: 104 mmol/L (ref 98–111)
CO2: 26 mmol/L (ref 22–32)
CREATININE: 0.75 mg/dL (ref 0.44–1.00)
Calcium: 8.1 mg/dL — ABNORMAL LOW (ref 8.9–10.3)
GFR calc non Af Amer: >60 mL/min (ref >60)
GLUCOSE: 134 mg/dL — AB (ref 70–99)
Potassium: 4.4 mmol/L (ref 3.5–5.1)
Sodium: 138 mmol/L (ref 135–145)

## 2018-09-16 LAB — CBC WITH DIFFERENTIAL/PLATELET
Abs Immature Granulocytes: 0.09 10*3/uL — ABNORMAL HIGH (ref 0.00–0.07)
BASOS ABS: 0 10*3/uL (ref 0.0–0.1)
Basophils Relative: 0 %
EOS PCT: 0 %
Eosinophils Absolute: 0 10*3/uL (ref 0.0–0.5)
HEMATOCRIT: 27.6 % — AB (ref 36.0–46.0)
HEMOGLOBIN: 8.6 g/dL — AB (ref 12.0–15.0)
Immature Granulocytes: 1 %
LYMPHS ABS: 1.8 10*3/uL (ref 0.7–4.0)
Lymphocytes Relative: 21 %
MCH: 30.9 pg (ref 26.0–34.0)
MCHC: 31.2 g/dL (ref 30.0–36.0)
MCV: 99.3 fL (ref 80.0–100.0)
MONO ABS: 0.8 10*3/uL (ref 0.1–1.0)
MONOS PCT: 9 %
Neutro Abs: 6.1 10*3/uL (ref 1.7–7.7)
Neutrophils Relative %: 69 %
Platelets: 296 10*3/uL (ref 150–400)
RBC: 2.78 MIL/uL — ABNORMAL LOW (ref 3.87–5.11)
RDW: 13.3 % (ref 11.5–15.5)
WBC: 8.8 10*3/uL (ref 4.0–10.5)
nRBC: 0 % (ref 0.0–0.2)

## 2018-09-16 LAB — GLUCOSE, CAPILLARY
GLUCOSE-CAPILLARY: 115 mg/dL — AB (ref 70–99)
Glucose-Capillary: 113 mg/dL — ABNORMAL HIGH (ref 70–99)
Glucose-Capillary: 122 mg/dL — ABNORMAL HIGH (ref 70–99)
Glucose-Capillary: 123 mg/dL — ABNORMAL HIGH (ref 70–99)
Glucose-Capillary: 109 mg/dL — ABNORMAL HIGH (ref 70–99)

## 2018-09-16 LAB — PHOSPHORUS: Phosphorus: 3.4 mg/dL (ref 2.5–4.6)

## 2018-09-16 MED ORDER — TRACE MINERALS CR-CU-MN-SE-ZN 10-1000-500-60 MCG/ML IV SOLN
INTRAVENOUS | Status: AC
  Administered 2018-09-16: 18:00:00 via INTRAVENOUS
  Filled 2018-09-16: qty 1992

## 2018-09-16 MED ORDER — FAT EMULSION PLANT BASED 20 % IV EMUL
240.0000 mL | INTRAVENOUS | Status: AC
  Administered 2018-09-16: 240 mL via INTRAVENOUS
  Filled 2018-09-16: qty 240

## 2018-09-16 NOTE — Consult Note (Signed)
Pharmacy Antibiotic Note  Cassie Carlson is a 72 y.o. female admitted on 09/03/2018 with incisional abscess and wound dehiscence. Patient with has recent transverse colectomy. Pharmacy has been consulted for vancomycin and zosyn dosing.  Patient received vancomycin 1g IV and Zosyn 3.375g IV x 1 dose in ED prior exploratory laparotomy incisional abscess washout.  Vancomycin d/c 11/21 per ID MD  Plan:  Day 13  Continue Zosyn EI 3.375 gm IV q8h   Height: 5' 3.5" (161.3 cm) Weight: 168 lb 6.9 oz (76.4 kg) IBW/kg (Calculated) : 53.55  Temp (24hrs), Avg:98.2 F (36.8 C), Min:97.6 F (36.4 C), Max:98.7 F (37.1 C)  Recent Labs  Lab 09/12/18 0443 09/13/18 0616 09/14/18 0507 09/15/18 0641 09/16/18 0422  WBC 6.4 8.2 8.5 9.5 8.8  CREATININE 0.80 0.82 0.78 0.75 0.75    Estimated Creatinine Clearance: 61.1 mL/min (by C-G formula based on SCr of 0.75 mg/dL).    Allergies  Allergen Reactions  . Asa [Aspirin] Other (See Comments)    GI distress/irritaion  . Codeine Other (See Comments)    "Face turned red like a sunburn"  . Ibuprofen Other (See Comments)    GI distress/irritation  . Nsaids Other (See Comments)    GI distress/irritation  . Other Itching    CHG wipes    Antimicrobials this admission: 11/16 Zosyn  >>  11/16 Vancomycin >> 11/21  Microbiology results: 11/16 BCx: Ng 11/16 Wound Cx:  MODERATE ESCHERICHIA COLI - pan sensitve MODERATE ENTEROBACTER CLOACAE - Resis to cefazolin HOLDING FOR POSSIBLE ANAEROBE  CULTURE REINCUBATED FOR BETTER GROWTH   Thank you for allowing pharmacy to be a part of this patient's care.  Forrest Moron, PharmD Clinical Pharmacist 09/16/2018 1:30 PM

## 2018-09-16 NOTE — Consult Note (Signed)
[facility] Nurse ostomy follow up Stoma type/location: RLQ, loop ileostomy Stomal assessment/size: 1" with off center skin barrier to provide space for wound care Peristomal assessment: NA Treatment options for stomal/peristomal skin: barrier ring Output liquid green Ostomy pouching: 2pc. 2 1/4" in place from pouch change yesterday  Enrolled patient in [place] Start Discharge program: Yes  [place] Nurse will follow along with you for continued support with ostomy teaching and care [employer] MSN, [place], [place], [place], [place]

## 2018-09-16 NOTE — Clinical Social Work Note (Signed)
CSW spoke with daughter today regarding bed offers. CSW explained that [facility] and [facility] are not available. CSW gave available bed offers and daughter not accepting of any of them. Daughter would like bed search extended to [facility]. CSW will extent bed search and give offers once received.   [place], [place]

## 2018-09-16 NOTE — Progress Notes (Signed)
PHARMACY - ADULT TOTAL PARENTERAL NUTRITION CONSULT NOTE   Pharmacy Consult for TPN electrolyte/glucose management Indication: NPO, ileostomy   Patient Measurements: Height: 5' 3.5" (161.3 cm) Weight: 168 lb 6.9 oz (76.4 kg) IBW/kg (Calculated) : 53.55 TPN AdjBW (KG): 60.6 Body mass index is 29.37 kg/m. Usual Weight:    Assessment: Original colectomy on 09/09/18. Exp.lap 11/17 for abscess.  On 11/19 she went back for Exploratory laparotomy, abdominal wound washout, phasix mesh removal, loop ileostomy creation, wound vac placement. Surgeon note  1.Feculent drainage from the failed primary repair of the original anastomotic leak. 2.Abdominal wound still well contained within the original abscess cavity. Extensive scar tissue buildup within the bowel and the surrounding tissue making it too unsafe to resect the anastomosis   GI: NPO Endo: SSI Insulin requirements in the past 24 hours:  0    Lytes K 4.4, Mag 2.5, Phos 3.4, Ca 8.5, Alb 2.0, CorrCa 10.1 Renal: Scr 0.78  Crcl 60.5 Cards: [id number] Hepatobil: ID: Vanc 11/16 >> 11/21 Zosyn  11/16 >>  Wound  TPN Access: PICC TPN start date: 09/09/18 Nutritional Goals (per RD recommendation on 09/09/18): Kcal:  1700-2000kcal/day  Protein:  90-97g/day  Fluid:  >1.6L/day  Regimen at goal will provide: kCal:1894kcal/day, Protein: 100g/day protein, Fluid:2245ml volume   Goal TPN rate is 83 ml/hr  Current Nutrition: NPO except ice chips, sips with meds  Plan:  Continue TPN Clinimix 5/15 with electrolytes @ goal rate of 83 ml/hr. 20% lipid emulsion @ 20 ml/hr x 12 hrs/day Additional Electrolytes in TPN: none Add MVI, trace elements, and Vitamin C 500 mg daily to TPN S/p 3 days thiamine  SSI q6h and adjust as needed Monitor TPN labs,  F/U electrolytes per protocol  Forrest Moron, PharmD Clinical Pharmacist 09/16/2018,1:27 PM

## 2018-09-16 NOTE — Care Management Important Message (Signed)
Copy of signed IM left with patient in room.  

## 2018-09-16 NOTE — Progress Notes (Signed)
Subjective:  CC:  Cassie Carlson is a 72 y.o. female  Hospital stay day 12, 10 Days Post-Op wound washout, phasix mesh removal, and loop ileostomy creation.  S/p exlap, wound washout, Phasix mesh placement over dehiscence from lap converted to open transverse colectomy for unresectable colon polyp.  Noted to have increased feculant drainage POD#3 so returned to OR for above.   HPI: No reported issues overnight.  ROS:  A 5 point review of systems was performed and pertinent positives and negatives noted in HPI.   Objective:      Temp:  [97.6 F (36.4 C)-98.7 F (37.1 C)] 98.2 F (36.8 C) (11/29 0533) Pulse Rate:  [68-74] 74 (11/29 0533) Resp:  [16-20] 20 (11/29 0533) BP: (125-132)/(55-61) 131/61 (11/29 0533) SpO2:  [97 %-98 %] 98 % (11/29 0533) Weight:  [76.4 kg-78.5 kg] 76.4 kg (11/29 0533)     Height: 5' 3.5" (161.3 cm) Weight: 76.4 kg BMI (Calculated): 29.36   Intake/Output this shift:  Scant mucosal drainage from blake over 52hs 170ml of old blood in wound vac over 24hrs      Constitutional :  alert, cooperative, appears stated age and no distress  Respiratory:  clear to auscultation bilaterally  Cardiovascular:  regular rate and rhythm  Gastrointestinal: wound vac in place.  Minimal leakage noted from medial aspect of loop ileostomy site.  aquacel in place   Skin: Cool and moist.   Psychiatric: Normal affect, non-agitated, not confused       LABS:  CMP Latest Ref Rng & Units 09/16/2018 09/15/2018 09/14/2018  Glucose 70 - 99 mg/dL 134(H) 129(H) 126(H)  BUN 8 - 23 mg/dL 24(H) 26(H) 25(H)  Creatinine 0.44 - 1.00 mg/dL 0.75 0.75 0.78  Sodium 135 - 145 mmol/L 138 137 140  Potassium 3.5 - 5.1 mmol/L 4.4 4.1 4.4  Chloride 98 - 111 mmol/L 104 103 103  CO2 22 - 32 mmol/L 26 26 28   Calcium 8.9 - 10.3 mg/dL 8.1(L) 8.1(L) 8.5(L)  Total Protein 6.5 - 8.1 g/dL - 6.0(L) -  Total Bilirubin 0.3 - 1.2 mg/dL - 0.3 -  Alkaline Phos 38 - 126 U/L - 76 -  AST 15 - 41 U/L - 34 -   ALT 0 - 44 U/L - 21 -   CBC Latest Ref Rng & Units 09/16/2018 09/15/2018 09/14/2018  WBC 4.0 - 10.5 K/uL 8.8 9.5 8.5  Hemoglobin 12.0 - 15.0 g/dL 8.6(L) 8.8(L) 9.4(L)  Hematocrit 36.0 - 46.0 % 27.6(L) 27.1(L) 30.7(L)  Platelets 150 - 400 K/uL 296 307 338    RADS: N/A Assessment:   exlap, wound washout, Phasix mesh placement over dehiscence from lap converted to open transverse colectomy for unresectable colon polyp.  Noted to have increased feculant drainage POD#3, so returned to OR for wound washout, phasix mesh removal, and loop ileostomy creation.  Stable today. ID recommends abx therapy until source control is obtained.  Because the leak cannot be fixed at this time as previously noted in the Op notes, will need to wait until wound heals through secondary intention, and the leak is no longer visible within the wound.  Continue TPN to allow adequate healing of ileostomy incision site to prevent further leakage from that area.  Wound VAC changed at bedside.  Wound measures approximately 16 cm long by 7 cm wide by 3.5 cm deep.  Healthy granulation tissue throughout the wound bed with the persistent: Opening at the superior portion.  Overall mucosal output has decreased within the wound as well  as through the [place] drain.  Decision was made with the healthy looking colon mucosa that is clearly visible to close the opening using 3-0 Vicryl in a baseball stitch manner.  Minimal bleeding was noted and the opening noted to be closed at the end.  White sponge was then placed on top and then black sponge over it and the wound VAC was replaced.  Patient tolerated the procedure change well with Ultram only.  We will continue to monitor mucus output to see if the closure remains intact.  With the stable looking ileostomy site, will start clear liquid diet today and monitor the amount and frequency of leakage around the area.  To need TPN for another day and will discontinue once patient is tolerating a  regular diet.  Plan is to see if the ostomy care becomes manageable enough to where patient can be discharged to the SNF at this point.  Wound VAC will be continued as an outpatient basis as well IV antibiotics will be discontinued upon discharge.  We will continue it for for now.  Plan discussed with patient and daughter than they verbalized understanding and are currently in agreement

## 2018-09-16 NOTE — Progress Notes (Signed)
Physical Therapy Treatment Patient Details Name: Cassie Carlson MRN: [id number] DOB: April 25, 1946 Today's Date: 09/16/2018    History of Present Illness 71 yo female with transverse colectomy after polyp removal presented with fecal drainage from her wounds, with dehiscense and abcess formation.  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  PMHx:  colon resection, anxiety, dementia, depression, cholecystectomy, hypothyroidism, d&c uterus, cervical spine fusion, L shoulder arthroscopy,     PT Comments    Pt did well with PT today and states that generally she has been feeling stronger and better over the last 2-3 days.  She was able to ambulate most of the loop this date w/o AD and actually did better with speed and cadence w/o effecting safety/balance.  Pt did well with seated exercises and with cuing and encouragement she was able to get to sitting at EOB w/o direct assist and no issues with abdominal or other pain.  Pt with HR in 90s at the start of session and while working on bed mobility/transfers, post ambulation she had only mild fatigue with HR in the 100s.   Follow Up Recommendations  Home health PT;Supervision for mobility/OOB     Equipment Recommendations  Rolling walker with 5" wheels    Recommendations for Other Services       Precautions / Restrictions Precautions Precautions: Fall Precaution Comments: abdominal incision with wound vac; colostomy; drain Restrictions Weight Bearing Restrictions: No    Mobility  Bed Mobility Overal bed mobility: Modified Independent Bed Mobility: Supine to Sit Rolling: Supervision   Supine to sit: Supervision;Min guard     General bed mobility comments: Pt did well getting to EOB, showed good overall strength and confidence with mobility tasks.  Transfers Overall transfer level: Needs assistance Equipment used: Rolling walker (2 wheeled) Transfers: Sit  to/from Stand Sit to Stand: Min guard         General transfer comment: Pt was able to rise to standing X2 during session without direct assist.  Pt did well with with good safety and confidence.    Ambulation/Gait Ambulation/Gait assistance: Supervision Gait Distance (Feet): 200 Feet Assistive device: None;Rolling walker (2 wheeled)       General Gait Details: ~50 ft with RW, ~25 ft with single hand rail, ~125 ft w/o AD/UE.  She had some fatigue with the effort but ultimately did well and showed good safety t/o.  She actually had increased speed and step length w/o AD without negative effect on balance/safety.    Stairs             Wheelchair Mobility    Modified Rankin (Stroke Patients Only)       Balance Overall balance assessment: Modified Independent   Sitting balance-Leahy Scale: Good       Standing balance-Leahy Scale: Good                              Cognition Arousal/Alertness: Awake/alert Behavior During Therapy: WFL for tasks assessed/performed Overall Cognitive Status: Within Functional Limits for tasks assessed                                        Exercises General Exercises - Lower Extremity Long Arc Quad: Strengthening;10 reps Heel Slides: Strengthening;10 reps Hip ABduction/ADduction: Strengthening;10 reps Hip Flexion/Marching: Strengthening;10 reps  General Comments        Pertinent Vitals/Pain Pain Assessment: No/denies pain    Home Living                      Prior Function            PT Goals (current goals can now be found in the care plan section) Progress towards PT goals: Progressing toward goals    Frequency    Min 2X/week      PT Plan Current plan remains appropriate    Co-evaluation              AM-PAC PT "6 Clicks" Mobility   Outcome Measure  Help needed turning from your back to your side while in a flat bed without using bedrails?: None Help needed  moving from lying on your back to sitting on the side of a flat bed without using bedrails?: None Help needed moving to and from a bed to a chair (including a wheelchair)?: None Help needed standing up from a chair using your arms (e.g., wheelchair or bedside chair)?: None Help needed to walk in hospital room?: None Help needed climbing 3-5 steps with a railing? : A Little 6 Click Score: 23    End of Session Equipment Utilized During Treatment: Gait belt Activity Tolerance: Patient tolerated treatment well Patient left: with call bell/phone within reach;with chair alarm set   PT Visit Diagnosis: Unsteadiness on feet (R26.81);Muscle weakness (generalized) (M62.81);Other abnormalities of gait and mobility (R26.89)     Time: 8341-9622 PT Time Calculation (min) (ACUTE ONLY): 40 min  Charges:  $Gait Training: 8-22 mins $Therapeutic Exercise: 8-22 mins $Therapeutic Activity: 8-22 mins                     Kreg Shropshire, DPT 09/16/2018, 3:58 PM

## 2018-09-17 LAB — CBC WITH DIFFERENTIAL/PLATELET
ABS IMMATURE GRANULOCYTES: 0.06 10*3/uL (ref 0.00–0.07)
Basophils Absolute: 0 10*3/uL (ref 0.0–0.1)
Basophils Relative: 0 %
Eosinophils Absolute: 0 10*3/uL (ref 0.0–0.5)
Eosinophils Relative: 0 %
HCT: 27.2 % — ABNORMAL LOW (ref 36.0–46.0)
Hemoglobin: 8.6 g/dL — ABNORMAL LOW (ref 12.0–15.0)
Immature Granulocytes: 1 %
Lymphocytes Relative: 22 %
Lymphs Abs: 1.6 10*3/uL (ref 0.7–4.0)
MCH: 30.6 pg (ref 26.0–34.0)
MCHC: 31.6 g/dL (ref 30.0–36.0)
MCV: 96.8 fL (ref 80.0–100.0)
Monocytes Absolute: 0.8 10*3/uL (ref 0.1–1.0)
Monocytes Relative: 11 %
Neutro Abs: 4.8 10*3/uL (ref 1.7–7.7)
Neutrophils Relative %: 66 %
Platelets: 297 10*3/uL (ref 150–400)
RBC: 2.81 MIL/uL — ABNORMAL LOW (ref 3.87–5.11)
RDW: 13.3 % (ref 11.5–15.5)
WBC: 7.2 10*3/uL (ref 4.0–10.5)
nRBC: 0 % (ref 0.0–0.2)

## 2018-09-17 LAB — COMPREHENSIVE METABOLIC PANEL
ALT: 25 U/L (ref 0–44)
AST: 32 U/L (ref 15–41)
Albumin: 2.5 g/dL — ABNORMAL LOW (ref 3.5–5.0)
Alkaline Phosphatase: 76 U/L (ref 38–126)
Anion gap: 7 (ref 5–15)
BUN: 24 mg/dL — ABNORMAL HIGH (ref 8–23)
CHLORIDE: 104 mmol/L (ref 98–111)
CO2: 25 mmol/L (ref 22–32)
Calcium: 8.1 mg/dL — ABNORMAL LOW (ref 8.9–10.3)
Creatinine, Ser: 0.71 mg/dL (ref 0.44–1.00)
GFR calc Af Amer: >60 mL/min (ref >60)
GFR calc non Af Amer: >60 mL/min (ref >60)
Glucose, Bld: 125 mg/dL — ABNORMAL HIGH (ref 70–99)
Potassium: 3.9 mmol/L (ref 3.5–5.1)
Sodium: 136 mmol/L (ref 135–145)
Total Bilirubin: 0.3 mg/dL (ref 0.3–1.2)
Total Protein: 6.2 g/dL — ABNORMAL LOW (ref 6.5–8.1)

## 2018-09-17 LAB — GLUCOSE, CAPILLARY
Glucose-Capillary: 114 mg/dL — ABNORMAL HIGH (ref 70–99)
Glucose-Capillary: 117 mg/dL — ABNORMAL HIGH (ref 70–99)
Glucose-Capillary: 121 mg/dL — ABNORMAL HIGH (ref 70–99)
Glucose-Capillary: 126 mg/dL — ABNORMAL HIGH (ref 70–99)

## 2018-09-17 LAB — BASIC METABOLIC PANEL
Anion gap: 5 (ref 5–15)
BUN: 24 mg/dL — ABNORMAL HIGH (ref 8–23)
CO2: 27 mmol/L (ref 22–32)
Calcium: 8.1 mg/dL — ABNORMAL LOW (ref 8.9–10.3)
Chloride: 105 mmol/L (ref 98–111)
Creatinine, Ser: 0.77 mg/dL (ref 0.44–1.00)
GFR calc Af Amer: >60 mL/min (ref >60)
Glucose, Bld: 126 mg/dL — ABNORMAL HIGH (ref 70–99)
POTASSIUM: 4.3 mmol/L (ref 3.5–5.1)
SODIUM: 137 mmol/L (ref 135–145)

## 2018-09-17 LAB — PHOSPHORUS: PHOSPHORUS: 3.4 mg/dL (ref 2.5–4.6)

## 2018-09-17 MED ORDER — SUCRALFATE 1 G PO TABS
1.0000 g | ORAL_TABLET | Freq: Two times a day (BID) | ORAL | Status: DC
  Administered 2018-09-17 – 2018-09-21 (×9): 1 g via ORAL
  Filled 2018-09-17 (×9): qty 1

## 2018-09-17 MED ORDER — TRACE MINERALS CR-CU-MN-SE-ZN 10-1000-500-60 MCG/ML IV SOLN
INTRAVENOUS | Status: AC
  Administered 2018-09-17: 17:00:00 via INTRAVENOUS
  Filled 2018-09-17: qty 1992

## 2018-09-17 MED ORDER — FAMOTIDINE 20 MG PO TABS
20.0000 mg | ORAL_TABLET | Freq: Two times a day (BID) | ORAL | Status: DC
  Administered 2018-09-17 – 2018-09-21 (×9): 20 mg via ORAL
  Filled 2018-09-17 (×9): qty 1

## 2018-09-17 MED ORDER — FAT EMULSION PLANT BASED 20 % IV EMUL
250.0000 mL | INTRAVENOUS | Status: AC
  Administered 2018-09-17: 250 mL via INTRAVENOUS
  Filled 2018-09-17: qty 250

## 2018-09-17 NOTE — Progress Notes (Signed)
09/17/2018  Subjective: Patient is 11 Days Post-Op s/p wound washout, phasix mesh removal, and loop ileostomy creation.  No acute events overnight.  Patient denies any problems with the wound vac and denies any leakage around the ileostomy.  Tolerated clears without any complications, pain, or nausea.  Vital signs: Temp:  [97.7 F (36.5 C)-98.7 F (37.1 C)] 98 F (36.7 C) (11/30 1300) Pulse Rate:  [74-77] 77 (11/30 1300) Resp:  [16-20] 19 (11/30 1300) BP: (117-133)/(63-69) 121/68 (11/30 1300) SpO2:  [97 %-100 %] 98 % (11/30 1300) Weight:  [78 kg] 78 kg (11/30 0550)   Intake/Output: 11/29 0701 - 11/30 0700 In: 2355.2 [I.V.:2175.4; IV Piggyback:179.7] Out: 7591 [Urine:700; Drains:470; Stool:175] Last BM Date: 09/16/18  Physical Exam: Constitutional: No acute distress Abdomen:  Soft, nondistended, appropriately tender to palpation.  Wound vac in place with good seal and suction with only serosanguinous fluid in canister.  Ileostomy healthy without any stool leakage around it.  Medial wound without any drainage at this point.  Labs:  Recent Labs    09/16/18 0422 09/17/18 0504  WBC 8.8 7.2  HGB 8.6* 8.6*  HCT 27.6* 27.2*  PLT 296 297   Recent Labs    09/16/18 0422 09/17/18 0504  NA 138 137  K 4.4 4.3  CL 104 105  CO2 26 27  GLUCOSE 134* 126*  BUN 24* 24*  CREATININE 0.75 0.77  CALCIUM 8.1* 8.1*   No results for input(s): LABPROT, INR in the last 72 hours.  Imaging: No results found.  Assessment/Plan: This is a 72 y.o. female s/p wound washout, phasix mesh removal, and loop ileostomy creation  --patient has been more down with regards to her mood over the past two days.  Discussed with her that this is normal for the healing process and grieving process.  Recommended that she try to think of things that bring her joy and try to do them in the hospital, like movies, reading, knitting, etc.  Her daughter was able to bring her dog earlier in the week and this brought  her a lot of joy.  She's currently on Effexor and do not think we need to make any adjustments at this time. --wound vac and ileostomy working well without any evident complication at this point.  She has tolerated clear liquids yesterday and today.  Will advance to full liquids for dinner tonight.  Did advise the patient to take it slow and stop if any pain or nausea. --continue TPN, IV antibiotics, wound care.   Melvyn Neth, [facility]

## 2018-09-17 NOTE — Progress Notes (Signed)
On call surgeon made aware that pt is complaining of her surgical  incisions burning, especially the incision closet to the ostomy. RN assessed the area and saw a scant amount of thick green and brown drainage on gauze. Gauze dressing was changed, ostomy dressing clean and intact. VSS. Pt refuses pain medications at this time. Verbal order to change pt.'s diet back to clear liquids at this time. RN will continue to monitor closely.   Prentice Sackrider [employer]

## 2018-09-17 NOTE — Progress Notes (Signed)
On call surgeon notified that pt has been having increased confusion in the last couple of hours per family. Verbal order to place CMP, BMP, and UA. Pt and family updated.   Cary Lothrop [employer]

## 2018-09-17 NOTE — Progress Notes (Signed)
PHARMACY - ADULT TOTAL PARENTERAL NUTRITION CONSULT NOTE   Pharmacy Consult for TPN electrolyte/glucose management Indication: NPO, ileostomy   Patient Measurements: Height: 5' 3.5" (161.3 cm) Weight: 172 lb (78 kg) IBW/kg (Calculated) : 53.55 TPN AdjBW (KG): 60.6 Body mass index is 29.99 kg/m. Usual Weight:    Assessment: Original colectomy on 09/09/18. Exp.lap 11/17 for abscess.  On 11/19 she went back for Exploratory laparotomy, abdominal wound washout, phasix mesh removal, loop ileostomy creation, wound vac placement. Surgeon note  1.Feculent drainage from the failed primary repair of the original anastomotic leak. 2.Abdominal wound still well contained within the original abscess cavity. Extensive scar tissue buildup within the bowel and the surrounding tissue making it too unsafe to resect the anastomosis   GI: NPO Endo: SSI Insulin requirements in the past 24 hours:  3 units  Lytes K 4.3, Mag 2.1, Phos 3.4, Ca 8.1, Alb 2.0, CorrCa 9.8 Renal: Scr 0.78  Crcl 60.5 Cards: TG [id number] Hepatobil: ID: Vanc 11/16 >> 11/21 Zosyn  11/16 >>  Wound  TPN Access: PICC TPN start date: 09/09/18 Nutritional Goals (per RD recommendation on 09/09/18): Kcal:  1700-2000kcal/day  Protein:  90-97g/day  Fluid:  >1.6L/day  Regimen at goal will provide: kCal:1894kcal/day, Protein: 100g/day protein, Fluid:2258ml volume   Goal TPN rate is 83 ml/hr  Current Nutrition: NPO except ice chips, sips with meds  Plan:  Continue TPN Clinimix 5/15 with electrolytes @ goal rate of 83 ml/hr. 20% lipid emulsion @ 20 ml/hr x 12 hrs/day  Add MVI, trace elements, and Vitamin C 500 mg daily to TPN  S/p 3 days thiamine  SSI q6h and adjust as needed  Monitor TPN labs,  F/U electrolytes per protocol  Pernell Dupre, PharmD, BCPS Clinical Pharmacist 09/17/2018 12:05 PM

## 2018-09-18 LAB — URINALYSIS, ROUTINE W REFLEX MICROSCOPIC
BILIRUBIN URINE: NEGATIVE
Glucose, UA: NEGATIVE mg/dL
HGB URINE DIPSTICK: NEGATIVE
Ketones, ur: NEGATIVE mg/dL
NITRITE: NEGATIVE
Protein, ur: NEGATIVE mg/dL
SPECIFIC GRAVITY, URINE: 1.01 (ref 1.005–1.030)
pH: 5 (ref 5.0–8.0)

## 2018-09-18 LAB — CBC WITH DIFFERENTIAL/PLATELET
Abs Immature Granulocytes: 0.04 10*3/uL (ref 0.00–0.07)
BASOS ABS: 0 10*3/uL (ref 0.0–0.1)
BASOS PCT: 0 %
Eosinophils Absolute: 0 10*3/uL (ref 0.0–0.5)
Eosinophils Relative: 0 %
HCT: 28.2 % — ABNORMAL LOW (ref 36.0–46.0)
Hemoglobin: 8.8 g/dL — ABNORMAL LOW (ref 12.0–15.0)
Immature Granulocytes: 1 %
Lymphocytes Relative: 22 %
Lymphs Abs: 1.5 10*3/uL (ref 0.7–4.0)
MCH: 30.4 pg (ref 26.0–34.0)
MCHC: 31.2 g/dL (ref 30.0–36.0)
MCV: 97.6 fL (ref 80.0–100.0)
Monocytes Absolute: 0.7 10*3/uL (ref 0.1–1.0)
Monocytes Relative: 11 %
Neutro Abs: 4.5 10*3/uL (ref 1.7–7.7)
Neutrophils Relative %: 66 %
Platelets: 272 10*3/uL (ref 150–400)
RBC: 2.89 MIL/uL — AB (ref 3.87–5.11)
RDW: 13.3 % (ref 11.5–15.5)
WBC: 6.8 10*3/uL (ref 4.0–10.5)
nRBC: 0 % (ref 0.0–0.2)

## 2018-09-18 LAB — PHOSPHORUS: Phosphorus: 3.5 mg/dL (ref 2.5–4.6)

## 2018-09-18 LAB — BASIC METABOLIC PANEL
Anion gap: 6 (ref 5–15)
BUN: 23 mg/dL (ref 8–23)
CO2: 25 mmol/L (ref 22–32)
Calcium: 8.2 mg/dL — ABNORMAL LOW (ref 8.9–10.3)
Chloride: 106 mmol/L (ref 98–111)
Creatinine, Ser: 0.79 mg/dL (ref 0.44–1.00)
GFR calc non Af Amer: >60 mL/min (ref >60)
Glucose, Bld: 121 mg/dL — ABNORMAL HIGH (ref 70–99)
Potassium: 4.1 mmol/L (ref 3.5–5.1)
SODIUM: 137 mmol/L (ref 135–145)

## 2018-09-18 LAB — GLUCOSE, CAPILLARY
Glucose-Capillary: 113 mg/dL — ABNORMAL HIGH (ref 70–99)
Glucose-Capillary: 121 mg/dL — ABNORMAL HIGH (ref 70–99)
Glucose-Capillary: 127 mg/dL — ABNORMAL HIGH (ref 70–99)
Glucose-Capillary: 98 mg/dL (ref 70–99)

## 2018-09-18 MED ORDER — TRACE MINERALS CR-CU-MN-SE-ZN 10-1000-500-60 MCG/ML IV SOLN
INTRAVENOUS | Status: AC
  Administered 2018-09-18: 18:00:00 via INTRAVENOUS
  Filled 2018-09-18: qty 1992

## 2018-09-18 MED ORDER — FAT EMULSION PLANT BASED 20 % IV EMUL
240.0000 mL | INTRAVENOUS | Status: AC
  Administered 2018-09-18: 240 mL via INTRAVENOUS
  Filled 2018-09-18: qty 240

## 2018-09-18 NOTE — Progress Notes (Signed)
PHARMACY - ADULT TOTAL PARENTERAL NUTRITION CONSULT NOTE   Pharmacy Consult for TPN electrolyte/glucose management Indication: NPO, ileostomy   Patient Measurements: Height: 5' 3.5" (161.3 cm) Weight: 172 lb (78 kg) IBW/kg (Calculated) : 53.55 TPN AdjBW (KG): 60.6 Body mass index is 29.99 kg/m. Usual Weight:    Assessment: Original colectomy on 09/09/18. Exp.lap 11/17 for abscess.  On 11/19 she went back for Exploratory laparotomy, abdominal wound washout, phasix mesh removal, loop ileostomy creation, wound vac placement. Surgeon note  1.Feculent drainage from the failed primary repair of the original anastomotic leak. 2.Abdominal wound still well contained within the original abscess cavity. Extensive scar tissue buildup within the bowel and the surrounding tissue making it too unsafe to resect the anastomosis   GI: NPO Endo: SSI Insulin requirements in the past 24 hours:  3 units  Lytes K 4.3, Mag 2.1, Phos 3.4, Ca 8.1, Alb 2.0, CorrCa 9.8 Renal: Scr 0.78  Crcl 60.5 Cards: TG [id number] Hepatobil: ID: Vanc 11/16 >> 11/21 Zosyn  11/16 >>  Wound  TPN Access: PICC TPN start date: 09/09/18 Nutritional Goals (per RD recommendation on 09/09/18): Kcal:  1700-2000kcal/day  Protein:  90-97g/day  Fluid:  >1.6L/day  Regimen at goal will provide: kCal:1894kcal/day, Protein: 100g/day protein, Fluid:2234ml volume   Goal TPN rate is 83 ml/hr  Current Nutrition: NPO except ice chips, sips with meds  Plan:  Continue TPN Clinimix 5/15 with electrolytes @ goal rate of 83 ml/hr. 20% lipid emulsion @ 20 ml/hr x 12 hrs/day  Add MVI, trace elements, and Vitamin C 500 mg daily to TPN  S/p 3 days thiamine  SSI q6h and adjust as needed  Monitor TPN labs,  F/U electrolytes per protocol  Laural Benes, PharmD, BCPS Clinical Pharmacist 09/18/2018 10:01 AM

## 2018-09-18 NOTE — Progress Notes (Signed)
Pt.'s family is still concerned about pt.'s confusion and depression regarding her medications, MD was made aware and no new orders given at this time. Pt is alert and oriented times four but is forgetful at times.  RN assisted pt and her daughter to help push pt around in the hallways and off the unit to get pt out of her room. Pt enjoyed getting off the unit and seeing a different scenery.   Lelend Heinecke [employer]

## 2018-09-18 NOTE — Progress Notes (Signed)
09/18/2018  Subjective: Patient is 12 Days Post-Op.  Concern for confusion last night but electrolytes normal and U/A only showed moderate LE, but negative for nitrate.  Currently on Zosyn.  Patient this morning doing well, oriented, without issues.  Reports that was having burning sensation around the perimeter of the wound vac.  Due to this, kept on clear liquids overnight just in case.  There is mild green drainage from wound medial to ostomy.  Vital signs: Temp:  [98 F (36.7 C)-98.4 F (36.9 C)] 98.4 F (36.9 C) (12/01 0549) Pulse Rate:  [69-77] 76 (12/01 0549) Resp:  [18-19] 18 (12/01 0549) BP: (121-132)/(64-68) 131/66 (12/01 0549) SpO2:  [98 %-100 %] 100 % (12/01 0549)   Intake/Output: 11/30 0701 - 12/01 0700 In: 2123.3 [I.V.:1899.7; IV Piggyback:223.6] Out: 4801 [Urine:1300; Drains:50; Stool:275] Last BM Date: 09/18/18  Physical Exam: Constitutional: No acute distress Abdomen:  Soft, non-distended, nontender to palpation.  Wound vac in place to suction with serosanguinous fluid in cannister and no evidence of fistula drainage.  Ostomy appliance in place.  Medial wound healing well, except for mild drainage coming from right under the ostomy appliance where aquacel dressing is located.  Labs:  Recent Labs    09/17/18 0504 09/18/18 0602  WBC 7.2 6.8  HGB 8.6* 8.8*  HCT 27.2* 28.2*  PLT 297 272   Recent Labs    09/17/18 1932 09/18/18 0602  NA 136 137  K 3.9 4.1  CL 104 106  CO2 25 25  GLUCOSE 125* 121*  BUN 24* 23  CREATININE 0.71 0.79  CALCIUM 8.1* 8.2*   No results for input(s): LABPROT, INR in the last 72 hours.  Imaging: No results found.  Assessment/Plan: This is a 72 y.o. female s/p wound washout, phasix mesh removal, and loop ileostomy creation  --will advance to full liquids --continue ostomy care and wound vac dressing.  Will be due to dressing and ostomy appliance change tomorrow. --continue TPN, IV antibiotics   Melvyn Neth,  [place]

## 2018-09-19 DIAGNOSIS — T8143XA Infection following a procedure, organ and space surgical site, initial encounter: Secondary | ICD-10-CM

## 2018-09-19 DIAGNOSIS — T8130XA Disruption of wound, unspecified, initial encounter: Secondary | ICD-10-CM

## 2018-09-19 DIAGNOSIS — T8149XA Infection following a procedure, other surgical site, initial encounter: Secondary | ICD-10-CM

## 2018-09-19 DIAGNOSIS — K9189 Other postprocedural complications and disorders of digestive system: Secondary | ICD-10-CM

## 2018-09-19 LAB — CBC WITH DIFFERENTIAL/PLATELET
Abs Immature Granulocytes: 0.03 10*3/uL (ref 0.00–0.07)
Basophils Absolute: 0 10*3/uL (ref 0.0–0.1)
Basophils Relative: 0 %
Eosinophils Absolute: 0 10*3/uL (ref 0.0–0.5)
Eosinophils Relative: 0 %
HEMATOCRIT: 28.6 % — AB (ref 36.0–46.0)
Hemoglobin: 8.8 g/dL — ABNORMAL LOW (ref 12.0–15.0)
Immature Granulocytes: 1 %
LYMPHS ABS: 1.7 10*3/uL (ref 0.7–4.0)
Lymphocytes Relative: 27 %
MCH: 30.8 pg (ref 26.0–34.0)
MCHC: 30.8 g/dL (ref 30.0–36.0)
MCV: 100 fL (ref 80.0–100.0)
Monocytes Absolute: 0.8 10*3/uL (ref 0.1–1.0)
Monocytes Relative: 13 %
Neutro Abs: 3.7 10*3/uL (ref 1.7–7.7)
Neutrophils Relative %: 59 %
Platelets: 278 10*3/uL (ref 150–400)
RBC: 2.86 MIL/uL — ABNORMAL LOW (ref 3.87–5.11)
RDW: 13.5 % (ref 11.5–15.5)
WBC: 6.2 10*3/uL (ref 4.0–10.5)
nRBC: 0 % (ref 0.0–0.2)

## 2018-09-19 LAB — GLUCOSE, CAPILLARY
GLUCOSE-CAPILLARY: 100 mg/dL — AB (ref 70–99)
Glucose-Capillary: 112 mg/dL — ABNORMAL HIGH (ref 70–99)
Glucose-Capillary: 114 mg/dL — ABNORMAL HIGH (ref 70–99)
Glucose-Capillary: 119 mg/dL — ABNORMAL HIGH (ref 70–99)

## 2018-09-19 LAB — COMPREHENSIVE METABOLIC PANEL
ALK PHOS: 80 U/L (ref 38–126)
ALT: 26 U/L (ref 0–44)
AST: 32 U/L (ref 15–41)
Albumin: 2.5 g/dL — ABNORMAL LOW (ref 3.5–5.0)
Anion gap: 8 (ref 5–15)
BUN: 22 mg/dL (ref 8–23)
CALCIUM: 8.4 mg/dL — AB (ref 8.9–10.3)
CO2: 25 mmol/L (ref 22–32)
CREATININE: 0.8 mg/dL (ref 0.44–1.00)
Chloride: 106 mmol/L (ref 98–111)
GFR calc Af Amer: >60 mL/min (ref >60)
Glucose, Bld: 117 mg/dL — ABNORMAL HIGH (ref 70–99)
Potassium: 4.7 mmol/L (ref 3.5–5.1)
Sodium: 139 mmol/L (ref 135–145)
Total Bilirubin: 0.3 mg/dL (ref 0.3–1.2)
Total Protein: 6.5 g/dL (ref 6.5–8.1)

## 2018-09-19 LAB — MAGNESIUM: Magnesium: 2.1 mg/dL (ref 1.7–2.4)

## 2018-09-19 LAB — PREALBUMIN: Prealbumin: 24.1 mg/dL (ref 18–38)

## 2018-09-19 LAB — PHOSPHORUS: Phosphorus: 3.6 mg/dL (ref 2.5–4.6)

## 2018-09-19 LAB — TRIGLYCERIDES: Triglycerides: 162 mg/dL — ABNORMAL HIGH (ref <150)

## 2018-09-19 MED ORDER — ENSURE ENLIVE PO LIQD
237.0000 mL | Freq: Three times a day (TID) | ORAL | Status: DC
  Administered 2018-09-19 – 2018-09-21 (×5): 237 mL via ORAL

## 2018-09-19 MED ORDER — FAT EMULSION PLANT BASED 20 % IV EMUL
240.0000 mL | INTRAVENOUS | Status: AC
  Administered 2018-09-19: 240 mL via INTRAVENOUS
  Filled 2018-09-19: qty 240

## 2018-09-19 MED ORDER — TRACE MINERALS CR-CU-MN-SE-ZN 10-1000-500-60 MCG/ML IV SOLN
INTRAVENOUS | Status: AC
  Administered 2018-09-19: 20:00:00 via INTRAVENOUS
  Filled 2018-09-19: qty 960

## 2018-09-19 MED ORDER — TRACE MINERALS CR-CU-MN-SE-ZN 10-1000-500-60 MCG/ML IV SOLN: INTRAVENOUS | Status: DC

## 2018-09-19 MED ORDER — FAT EMULSION PLANT BASED 20 % IV EMUL: 240.0000 mL | INTRAVENOUS | Status: DC

## 2018-09-19 NOTE — Plan of Care (Signed)

## 2018-09-19 NOTE — Care Management Important Message (Signed)
Aware of right, declined copy as already had others in room.

## 2018-09-19 NOTE — Consult Note (Signed)
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 Lysle Pearl noted possible flecks of feces along the left lateral aspect of the wound Wound was cleansed by Dr. Lysle Pearl Periwound:intact  Dressing procedure/placement/frequency: Dr. Lysle Pearl trimmed drain to make shorter in the wound bed. [place] nurse recharged JP prior to finishing up the ostomy care Removed old NPWT dressing Cleansed wound with normal saline Filled wound with_1__ piece of white foam, __1__ piece of black foam. Sealed NPWT dressing at 140mm HG/124mmHG  Patient tolerated procedure well, she had received PO Tramadol around 10am   WOC nurse will continue to provide NPWT dressing changed due to the complexity of the dressing change.  [place] nurse to page or communicate with Dr. Lysle Pearl for dressing changes M/W/F along with wound assessment and ostomy assessment.    [place] Nurse ostomy follow up Stoma type/location: RLQ, loop ileostomy Stomal assessment/size: 1" x 1 3/4" oval shaped, functional stoma at 10 o'clock; distal stoma at 5 o'clock   Peristomal assessment: dehisced surgical wound that extends from the stoma on the left side that is aprox 1cm x 3cm with an opening on the right lateral edge of the incision that is 2-3cm in depth (surgeon measured)  Treatment options for stomal/peristomal skin: packed tunneled area with silver hydrofiber Output liquid, seedy green Ostomy pouching: 2pc.2 3/4" cut off center to allow for coverage only of the open wound    WOC nurse will follow along with team for continued support of ostomy care and wound care needs.   Hali Balgobin, [place], [place]

## 2018-09-19 NOTE — Clinical Social Work Note (Signed)
Surgeon has informed CSW today that patient will not have to have TPN at discharge. CSW informed surgeon that a new bed search would be conducted with this updated information. Surgeon is updating patient's daughter. CSW will contact patient's daughter once offers are made. Shela Leff MSW,LCSW [phone]

## 2018-09-19 NOTE — Progress Notes (Signed)
Nutrition Follow Up Note   DOCUMENTATION CODES:   Obesity unspecified  INTERVENTION:    Decrease Clinimix 5/15 with electrolytes to 73m/hr   Continue 20% lipids _0 /hr x 12 hrs/day    Continue MVI and trace elements daily    Continue Vitamin C 5093mdaily in TPN   Daily weights  Add Ensure Enlive po TID, each supplement provides 350 kcal and 20 grams of protein  NUTRITION DIAGNOSIS:   Increased nutrient needs related to wound healing as evidenced by increased estimated needs.  GOAL:   Patient will meet greater than or equal to 90% of their needs  -met with TPN  MONITOR:   Diet advancement, Labs, Weight trends, Skin, I & O's, TPN  ASSESSMENT:   [age]/o female with h/o transverse colectomy for unresectable colon polyp on 08/23/18 now admitted for incisional abscess and wound dehiscence. Pt s/p Exploratory laparotomy incisional abscess washout, primary closure of anastomosis leak, placement of phasix mesh 11/17   Pt remains on TPN; tolerating well at goal. Labs stable; prealbumin wnl. Full liquid diet started yesterday. Pt to advance to regular diet today; will add Ensure. Per MD, will begin to wean TPN. Pt will need to aim for 100g of protein daily after discharge. Pt will also need to continue vitamin C after discharge to support wound healing.   Medications reviewed and include: lovenox, pepcid,  insulin, synthroid, protonix, carafate, zosyn  Labs reviewed: K 4.7 wnl, P 3.6 wnl, Mg 2.1 wnl Prealbumin 24.1 Triglycerides 162(H) Hgb 8.8(L), Hct 28.6(L) cbgs- 112, 114 x 24 hrs  Diet Order:   Diet Order            Diet full liquid Room service appropriate? Yes; Fluid consistency: Thin  Diet effective now             EDUCATION NEEDS:   Education needs have been addressed  Skin:  Skin Assessment: Reviewed RN Assessment(incision abdomen ), VAC, 12 cm x 7.8 cm x 6 cm   Last BM:  12/2- 10060mia ostomy  Height:   Ht Readings from Last 1 Encounters:  09/03/18  5' 3.5" (1.613 m)    Weight:   Wt Readings from Last 1 Encounters:  09/19/18 85.2 kg    Ideal Body Weight:  53.4 kg  BMI:  Body mass index is 32.75 kg/m.  Estimated Nutritional Needs:   Kcal:  1700-2000kcal/day   Protein:  90-97g/day   Fluid:  >1.6L/day   CasKoleen Distance, RD, LDN Pager #- [phone]#- [phone] Hours Pager: [phone]

## 2018-09-19 NOTE — Clinical Social Work Note (Signed)
CSW spoke with patient's daughter who was at bedside this afternoon and she and patient have chosen [facility]. [facility] made aware.  Shela Leff MsW,LCSW [phone]

## 2018-09-19 NOTE — Progress Notes (Signed)
Subjective:  CC:  Cassie Carlson is a 72 y.o. female  Hospital stay day 15, 13 Days Post-Op wound washout, phasix mesh removal, and loop ileostomy creation.  S/p exlap, wound washout, Phasix mesh placement over dehiscence from lap converted to open transverse colectomy for unresectable colon polyp.  Noted to have increased feculant drainage POD#3 so returned to OR for above.   HPI: No reported issues overnight.  Tolerating full liquid diet.  ROS:  A 5 point review of systems was performed and pertinent positives and negatives noted in HPI.   Objective:      Temp:  [97.6 F (36.4 C)-98.6 F (37 C)] 97.6 F (36.4 C) (12/02 1311) Pulse Rate:  [71-85] 74 (12/02 1311) Resp:  [16-18] 18 (12/02 1311) BP: (126-147)/(64-71) 126/64 (12/02 1311) SpO2:  [100 %] 100 % (12/02 1311) Weight:  [85.2 kg] 85.2 kg (12/02 0500)     Height: 5' 3.5" (161.3 cm) Weight: 85.2 kg BMI (Calculated): 32.75   Intake/Output this shift:  46ml FROM VAC AND BLAKE DRAIN 225 from ostomy   Constitutional :  alert, cooperative, appears stated age and no distress  Respiratory:  clear to auscultation bilaterally  Cardiovascular:  regular rate and rhythm  Gastrointestinal: wound vac in place.  Minimal leakage noted from medial aspect of loop ileostomy site.  aquacel in place   Skin: Cool and moist.   Psychiatric: Normal affect, non-agitated, not confused       LABS:  CMP Latest Ref Rng & Units 09/19/2018 09/18/2018 09/17/2018  Glucose 70 - 99 mg/dL 117(H) 121(H) 125(H)  BUN 8 - 23 mg/dL 22 23 24(H)  Creatinine 0.44 - 1.00 mg/dL 0.80 0.79 0.71  Sodium 135 - 145 mmol/L 139 137 136  Potassium 3.5 - 5.1 mmol/L 4.7 4.1 3.9  Chloride 98 - 111 mmol/L 106 106 104  CO2 22 - 32 mmol/L 25 25 25   Calcium 8.9 - 10.3 mg/dL 8.4(L) 8.2(L) 8.1(L)  Total Protein 6.5 - 8.1 g/dL 6.5 - 6.2(L)  Total Bilirubin 0.3 - 1.2 mg/dL 0.3 - 0.3  Alkaline Phos 38 - 126 U/L 80 - 76  AST 15 - 41 U/L 32 - 32  ALT 0 - 44 U/L 26 - 25   CBC  Latest Ref Rng & Units 09/19/2018 09/18/2018 09/17/2018  WBC 4.0 - 10.5 K/uL 6.2 6.8 7.2  Hemoglobin 12.0 - 15.0 g/dL 8.8(L) 8.8(L) 8.6(L)  Hematocrit 36.0 - 46.0 % 28.6(L) 28.2(L) 27.2(L)  Platelets 150 - 400 K/uL 278 272 297    RADS: N/A Assessment:   exlap, wound washout, Phasix mesh placement over dehiscence from lap converted to open transverse colectomy for unresectable colon polyp.  Noted to have increased feculant drainage POD#3, so returned to OR for wound washout, phasix mesh removal, and loop ileostomy creation.  Stable today. ID recommends abx therapy until source control is obtained.  Because the leak cannot be fixed at this time as previously noted in the Op notes, will need to wait until wound heals through secondary intention, and the leak is no longer visible within the wound.  Ileosotomy site continues to look clean, with minimal leakage of stool into wound that is packed with aquacell Ag.  Medial wound measures approximately 2cm x 3cm deep, with 3cm of closed incision in the medial aspect.  Wound VAC changed at bedside.  Wound measures approximately 11 cm long by 7 cm wide by 3.5 cm deep.  Healthy granulation tissue throughout the wound bed with the persistent but now smaller  opening in colon at the superior portion.  Mucosa mixed with some flaky stool noted in wound bed today, possibly from increased peristalsis since starting diet.  No evidence of additional fistulous tracts within wound bed.  With the stable looking ileostomy site, and patient tolerating diet so far, will advance to regular diet and discontinue TPN.   Plan is to see if the ostomy care becomes manageable enough to where patient can be discharged to the SNF at this point.  Wound VAC will be continued as an outpatient basis. IV abx discharged to see if any wounds stay non-infected.  Plan discussed with patient and daughter than they verbalized understanding and are currently in agreement

## 2018-09-19 NOTE — Progress Notes (Signed)
PHARMACY - ADULT TOTAL PARENTERAL NUTRITION CONSULT NOTE   Pharmacy Consult for TPN electrolyte/glucose management Indication: NPO, ileostomy   Patient Measurements: Height: 5' 3.5" (161.3 cm) Weight: 187 lb 13.3 oz (85.2 kg) IBW/kg (Calculated) : 53.55 TPN AdjBW (KG): 60.6 Body mass index is 32.75 kg/m. Usual Weight:    Assessment: Original colectomy on 09/09/18. Exp.lap 11/17 for abscess.  On 11/19 she went back for Exploratory laparotomy, abdominal wound washout, phasix mesh removal, loop ileostomy creation, wound vac placement. Surgeon note  1.Feculent drainage from the failed primary repair of the original anastomotic leak. 2.Abdominal wound still well contained within the original abscess cavity. Extensive scar tissue buildup within the bowel and the surrounding tissue making it too unsafe to resect the anastomosis   GI: NPO Endo: SSI Insulin requirements in the past 24 hours:  3 units  Lytes K 4.3, Mag 2.1, Phos 3.4, Ca 8.1, Alb 2.0, CorrCa 9.8 Renal: Scr 0.78  Crcl 60.5 Cards: TG 151 Hepatobil: ID: Vanc 11/16 >> 11/21 Zosyn  11/16 >>  Wound  TPN Access: PICC TPN start date: 09/09/18 Nutritional Goals (per RD recommendation on 09/09/18): Kcal:  1700-2000kcal/day  Protein:  90-97g/day  Fluid:  >1.6L/day  Regimen at goal will provide: kCal:1894kcal/day, Protein: 100g/day protein, Fluid:2286ml volume   Goal TPN rate is 83 ml/hr  Current Nutrition: NPO except ice chips, sips with meds  Plan:  Patient was started on CLD overnight and appears to have tolerated it. Plan to wean TPN today.  Decrease TPN Clinimix 5/15 with electrolytes to 40 mL/hr.  Continue 20% lipid emulsion @ 20 ml/hr x 12 hrs/day  Add MVI, trace elements, and Vitamin C 500 mg daily to TPN  S/p 3 days thiamine  SSI q6h and adjust as needed  12/2 Electrolytes WNL - no additional supplement at this time.   Monitor TPN labs,  F/U electrolytes per protocol  Laural Benes, PharmD,  BCPS Clinical Pharmacist 09/19/2018 10:37 AM

## 2018-09-20 LAB — BASIC METABOLIC PANEL
Anion gap: 9 (ref 5–15)
BUN: 24 mg/dL — ABNORMAL HIGH (ref 8–23)
CO2: 24 mmol/L (ref 22–32)
Calcium: 8.7 mg/dL — ABNORMAL LOW (ref 8.9–10.3)
Chloride: 108 mmol/L (ref 98–111)
Creatinine, Ser: 0.7 mg/dL (ref 0.44–1.00)
GFR calc Af Amer: >60 mL/min (ref >60)
GFR calc non Af Amer: >60 mL/min (ref >60)
Glucose, Bld: 120 mg/dL — ABNORMAL HIGH (ref 70–99)
Potassium: 4.4 mmol/L (ref 3.5–5.1)
Sodium: 141 mmol/L (ref 135–145)

## 2018-09-20 LAB — GLUCOSE, CAPILLARY
GLUCOSE-CAPILLARY: 107 mg/dL — AB (ref 70–99)
GLUCOSE-CAPILLARY: 101 mg/dL — AB (ref 70–99)
Glucose-Capillary: 106 mg/dL — ABNORMAL HIGH (ref 70–99)
Glucose-Capillary: 112 mg/dL — ABNORMAL HIGH (ref 70–99)
Glucose-Capillary: 93 mg/dL (ref 70–99)

## 2018-09-20 LAB — CBC WITH DIFFERENTIAL/PLATELET
Abs Immature Granulocytes: 0.04 10*3/uL (ref 0.00–0.07)
Basophils Absolute: 0 10*3/uL (ref 0.0–0.1)
Basophils Relative: 0 %
Eosinophils Absolute: 0 10*3/uL (ref 0.0–0.5)
Eosinophils Relative: 0 %
HCT: 30 % — ABNORMAL LOW (ref 36.0–46.0)
HEMOGLOBIN: 9.4 g/dL — AB (ref 12.0–15.0)
Immature Granulocytes: 1 %
LYMPHS PCT: 32 %
Lymphs Abs: 1.9 10*3/uL (ref 0.7–4.0)
MCH: 30.6 pg (ref 26.0–34.0)
MCHC: 31.3 g/dL (ref 30.0–36.0)
MCV: 97.7 fL (ref 80.0–100.0)
Monocytes Absolute: 0.7 10*3/uL (ref 0.1–1.0)
Monocytes Relative: 12 %
Neutro Abs: 3.2 10*3/uL (ref 1.7–7.7)
Neutrophils Relative %: 55 %
Platelets: 334 10*3/uL (ref 150–400)
RBC: 3.07 MIL/uL — ABNORMAL LOW (ref 3.87–5.11)
RDW: 13.7 % (ref 11.5–15.5)
WBC: 5.8 10*3/uL (ref 4.0–10.5)
nRBC: 0 % (ref 0.0–0.2)

## 2018-09-20 LAB — PHOSPHORUS: Phosphorus: 3.1 mg/dL (ref 2.5–4.6)

## 2018-09-20 MED ORDER — ADULT MULTIVITAMIN W/MINERALS CH
1.0000 | ORAL_TABLET | Freq: Every day | ORAL | Status: DC
  Administered 2018-09-21: 1 via ORAL
  Filled 2018-09-20: qty 1

## 2018-09-20 MED ORDER — VITAMIN C 500 MG PO TABS
500.0000 mg | ORAL_TABLET | Freq: Two times a day (BID) | ORAL | Status: DC
  Administered 2018-09-21: 500 mg via ORAL
  Filled 2018-09-20: qty 1

## 2018-09-20 MED ORDER — OCUVITE-LUTEIN PO CAPS
1.0000 | ORAL_CAPSULE | Freq: Every day | ORAL | Status: DC
  Administered 2018-09-21: 1 via ORAL
  Filled 2018-09-20: qty 1

## 2018-09-20 MED ORDER — PRO-STAT SUGAR FREE PO LIQD
30.0000 mL | Freq: Three times a day (TID) | ORAL | Status: DC
  Administered 2018-09-20 (×2): 30 mL via ORAL

## 2018-09-20 NOTE — Care Management (Signed)
Notified encompass that patient will be discharging to a skilled nursing facility

## 2018-09-20 NOTE — Progress Notes (Signed)
Subjective:  CC:  Cassie Carlson is a 72 y.o. female  Hospital stay day 16, 14 Days Post-Op wound washout, phasix mesh removal, and loop ileostomy creation.  S/p exlap, wound washout, Phasix mesh placement over dehiscence from lap converted to open transverse colectomy for unresectable colon polyp.  Noted to have increased feculant drainage POD#3 so returned to OR for above.   HPI: No reported issues overnight.  Tolerating regular food.  Feeling well this am.  ROS:  A 5 point review of systems was performed and pertinent positives and negatives noted in HPI.   Objective:      Temp:  [97.6 F (36.4 C)-98.3 F (36.8 C)] 98 F (36.7 C) (12/03 0433) Pulse Rate:  [74-81] 78 (12/03 0433) Resp:  [16-20] 16 (12/03 0433) BP: (115-131)/(57-70) 115/70 (12/03 0433) SpO2:  [97 %-100 %] 98 % (12/03 0433) Weight:  [85.7 kg] 85.7 kg (12/03 0500)     Height: 5' 3.5" (161.3 cm) Weight: 85.7 kg BMI (Calculated): 32.94   Intake/Output this shift:  71ml FROM VAC AND BLAKE DRAIN 250 from ostomy dark stool-   Constitutional :  alert, cooperative, appears stated age and no distress  Respiratory:  clear to auscultation bilaterally  Cardiovascular:  regular rate and rhythm  Gastrointestinal: wound vac in place.  no leakage noted from medial aspect of loop ileostomy site.  Bag with soft brown fluid.  aquacel in place.   Skin: Cool and moist.   Psychiatric: Normal affect, non-agitated, not confused       LABS:  CMP Latest Ref Rng & Units 09/20/2018 09/19/2018 09/18/2018  Glucose 70 - 99 mg/dL 120(H) 117(H) 121(H)  BUN 8 - 23 mg/dL 24(H) 22 23  Creatinine 0.44 - 1.00 mg/dL 0.70 0.80 0.79  Sodium 135 - 145 mmol/L 141 139 137  Potassium 3.5 - 5.1 mmol/L 4.4 4.7 4.1  Chloride 98 - 111 mmol/L 108 106 106  CO2 22 - 32 mmol/L 24 25 25   Calcium 8.9 - 10.3 mg/dL 8.7(L) 8.4(L) 8.2(L)  Total Protein 6.5 - 8.1 g/dL - 6.5 -  Total Bilirubin 0.3 - 1.2 mg/dL - 0.3 -  Alkaline Phos 38 - 126 U/L - 80 -  AST 15  - 41 U/L - 32 -  ALT 0 - 44 U/L - 26 -   CBC Latest Ref Rng & Units 09/20/2018 09/19/2018 09/18/2018  WBC 4.0 - 10.5 K/uL 5.8 6.2 6.8  Hemoglobin 12.0 - 15.0 g/dL 9.4(L) 8.8(L) 8.8(L)  Hematocrit 36.0 - 46.0 % 30.0(L) 28.6(L) 28.2(L)  Platelets 150 - 400 K/uL 334 278 272    RADS: N/A Assessment:   exlap, wound washout, Phasix mesh placement over dehiscence from lap converted to open transverse colectomy for unresectable colon polyp.  Noted to have increased feculant drainage POD#3, so returned to OR for wound washout, phasix mesh removal, and loop ileostomy creation.  Stable today. ID recommends abx therapy until source control is obtained.  Because the leak cannot be fixed at this time as previously noted in the Op notes, will need to wait until wound heals through secondary intention, and the leak is no longer visible within the wound.  Ileosotomy site continues to look clean, with minimal leakage of stool into wound that is packed with aquacell Ag.  Medial wound measures approximately 2cm x 3cm deep, with 3cm of closed incision in the medial aspect.  With the stable looking ileostomy site, tolerating regular diet with no sign of obstruciton, wound vac in place with minimal output,  and no signs of infection, patient is cleared to be discharged out of hospital once all outpatient accommodations are in place.

## 2018-09-20 NOTE — Progress Notes (Signed)
ID? Impression/Recommendation 72 y.o. female with a history of polyp of transverse colon which was unresectable by colonoscopy underwent on 08/22/18 laparoscopy which was converted to laparotomy with transverse colon colectomy and primary anastomosis. During that procedure dense adhesions between the omentum colon and mesentery noted and was released. A small splenic laceration was noted and bleeding controlled with packing. Postoperatively patient had some pain control issues requiring some narcotic use, which likely resulted in some altered mental status and postoperative ileus.  Altered mental status improved over time with PRN medications for anxiety and confusion, and gradual weaning of all narcotics.  Postoperative ileus was treated with NG tube decompression and a period of n.p.o.Patient was discharged home on 08/29/18 in stable condition with home health.  On 11/16 patient was brought back to the ED as redness was noted at the surgical site by [facility] and EMS was called. In the ED temp was 103. WBC was 14.6. CT abdomen revealed a 14.4 x 12 x 10 cm predominantly gas-filled cavity extending through a large anterior abdominal wall defect into the subcutaneous fat,  On 11/17  she underwent  Exploratory laparotomy incisional abscess washout, primary closure of anastomosis leak, placement of phasix mesh? On 11/19 she went back for Exploratory laparotomy , abdominal wound washout, phasix mesh removal, loop ileostomy creation, wound vac placement. ? ?Intra-abdominal abscess with  abdominal wall dehiscence and  leaking from the transverse colon anastomosis site with fecal soilage of peritoneum  S/p surgeries as above and has a wound vac E.coli and enterobacter and anerobes in the culture.was on   Zosyn which was stopped yesterday after Dr.Sakai examined the wound and found adequate source control. She completed 16 days of zosyn   Anemia -post surgery  Hypothyroidism on synthroid  Spoke with Daughter and  patient __________________________________________________ ID will sign off- call if needed

## 2018-09-20 NOTE — Progress Notes (Signed)
Nutrition Follow Up Note   DOCUMENTATION CODES:   Obesity unspecified  INTERVENTION:    Discontinue Clinimix 5/15 with electrolytes to 64m/hr   Discontinue 20% lipids _0 /hr x 12 hrs/day    Continue Ensure Enlive po TID, each supplement provides 350 kcal and 20 grams of protein  Add Prostat liquid protein PO 30 ml TID with meals, each supplement provides 100 kcal, 15 grams protein.  Vitamin C 5075mpo BID  Ocuvite daily for wound healing (provides zinc, vitamin A, vitamin C, Vitamin E, copper, and selenium)  MVI daily   NUTRITION DIAGNOSIS:   Increased nutrient needs related to wound healing as evidenced by increased estimated needs.  GOAL:   Patient will meet greater than or equal to 90% of their needs  -met with TPN  MONITOR:   Diet advancement, Labs, Weight trends, Skin, I & O's, TPN  ASSESSMENT:   [age]/o female with h/o transverse colectomy for unresectable colon polyp on 08/23/18 now admitted for incisional abscess and wound dehiscence. Pt s/p Exploratory laparotomy incisional abscess washout, primary closure of anastomosis leak, placement of phasix mesh 11/17   Plan to discontinue TPN today. Pt tolerating regular diet and drinking some Ensure. Will add vitamins to encourage wound healing; pt will need to continue vitamins after discharge. Will add Prostat as pt tolerating these prior to TPN. Pt will need to aim for 100g daily of protein. Per chart, pt appears weight stable since admit.   Medications reviewed and include: lovenox, pepcid,  insulin, synthroid, protonix, carafate, zosyn  Labs reviewed: K 4.4 wnl, P 3.1 wnl Prealbumin 24.1- 12/2  Diet Order:   Diet Order            Diet regular Room service appropriate? Yes; Fluid consistency: Thin  Diet effective now             EDUCATION NEEDS:   Education needs have been addressed  Skin:  Skin Assessment: Reviewed RN Assessment(incision abdomen ), VAC, 12 cm x 7.8 cm x 6 cm   Last BM:  12/2- 10071mvia ostomy  Height:   Ht Readings from Last 1 Encounters:  09/03/18 5' 3.5" (1.613 m)    Weight:   Wt Readings from Last 1 Encounters:  09/20/18 85.7 kg    Ideal Body Weight:  53.4 kg  BMI:  Body mass index is 32.94 kg/m.  Estimated Nutritional Needs:   Kcal:  1700-2000kcal/day   Protein:  90-97g/day   Fluid:  >1.6L/day   CasKoleen Distance, RD, LDN Pager #- [phone]#- [phone] Hours Pager: [phone]

## 2018-09-20 NOTE — Progress Notes (Signed)
Pt was attempted x 2 with a wound vac leak initially then tray arrived;  Attempted again and pt was asleep, apparently very tired from trying to eat again.  Daughter and PT talked about reattempting tomorrow.   09/20/18 1400  PT Visit Information  Last PT Received On 09/20/18  Reason Eval/Treat Not Completed Other (comment)    Mee Hives, PT MS Acute Rehab Dept. Number: [place] and [place]

## 2018-09-21 LAB — CBC WITH DIFFERENTIAL/PLATELET
Abs Immature Granulocytes: 0.04 10*3/uL (ref 0.00–0.07)
BASOS ABS: 0 10*3/uL (ref 0.0–0.1)
Basophils Relative: 0 %
EOS PCT: 0 %
Eosinophils Absolute: 0 10*3/uL (ref 0.0–0.5)
HCT: 27.9 % — ABNORMAL LOW (ref 36.0–46.0)
Hemoglobin: 8.8 g/dL — ABNORMAL LOW (ref 12.0–15.0)
Immature Granulocytes: 1 %
LYMPHS PCT: 32 %
Lymphs Abs: 1.9 10*3/uL (ref 0.7–4.0)
MCH: 30.9 pg (ref 26.0–34.0)
MCHC: 31.5 g/dL (ref 30.0–36.0)
MCV: 97.9 fL (ref 80.0–100.0)
Monocytes Absolute: 0.7 10*3/uL (ref 0.1–1.0)
Monocytes Relative: 11 %
Neutro Abs: 3.3 10*3/uL (ref 1.7–7.7)
Neutrophils Relative %: 56 %
Platelets: 308 10*3/uL (ref 150–400)
RBC: 2.85 MIL/uL — ABNORMAL LOW (ref 3.87–5.11)
RDW: 13.7 % (ref 11.5–15.5)
WBC: 5.9 10*3/uL (ref 4.0–10.5)
nRBC: 0 % (ref 0.0–0.2)

## 2018-09-21 LAB — BASIC METABOLIC PANEL
Anion gap: 6 (ref 5–15)
BUN: 27 mg/dL — ABNORMAL HIGH (ref 8–23)
CO2: 24 mmol/L (ref 22–32)
Calcium: 8.6 mg/dL — ABNORMAL LOW (ref 8.9–10.3)
Chloride: 112 mmol/L — ABNORMAL HIGH (ref 98–111)
Creatinine, Ser: 0.68 mg/dL (ref 0.44–1.00)
GFR calc Af Amer: >60 mL/min (ref >60)
GFR calc non Af Amer: >60 mL/min (ref >60)
Glucose, Bld: 106 mg/dL — ABNORMAL HIGH (ref 70–99)
Potassium: 5.1 mmol/L (ref 3.5–5.1)
Sodium: 142 mmol/L (ref 135–145)

## 2018-09-21 LAB — GLUCOSE, CAPILLARY
GLUCOSE-CAPILLARY: 109 mg/dL — AB (ref 70–99)
GLUCOSE-CAPILLARY: 93 mg/dL (ref 70–99)

## 2018-09-21 MED ORDER — OCUVITE-LUTEIN PO CAPS: 1.0000 | ORAL_CAPSULE | Freq: Every day | ORAL | 0 refills | Status: DC

## 2018-09-21 MED ORDER — PRO-STAT SUGAR FREE PO LIQD: 30.0000 mL | Freq: Three times a day (TID) | ORAL | 0 refills | Status: DC

## 2018-09-21 MED ORDER — ENSURE ENLIVE PO LIQD: 237.0000 mL | Freq: Three times a day (TID) | ORAL | 12 refills | Status: DC

## 2018-09-21 MED ORDER — TRAMADOL HCL 50 MG PO TABS: 50.0000 mg | ORAL_TABLET | Freq: Four times a day (QID) | ORAL | 0 refills | Status: AC | PRN

## 2018-09-21 MED ORDER — ACETAMINOPHEN 325 MG PO TABS: 650.0000 mg | ORAL_TABLET | Freq: Four times a day (QID) | ORAL | 0 refills | Status: DC | PRN

## 2018-09-21 MED ORDER — ASCORBIC ACID 500 MG PO TABS: 500.0000 mg | ORAL_TABLET | Freq: Two times a day (BID) | ORAL | 0 refills | Status: DC

## 2018-09-21 MED ORDER — ADULT MULTIVITAMIN W/MINERALS CH: 1.0000 | ORAL_TABLET | Freq: Every day | ORAL | 0 refills | Status: DC

## 2018-09-21 NOTE — Progress Notes (Signed)
Report called to Alli, Therapist, sports, at [facility].  Awaiting EMS transport.

## 2018-09-21 NOTE — Consult Note (Signed)
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 Drainage (amount, consistency, odor) brownish/pink in canister and JP drain, minimal odor Periwound:intact  Dressing procedure/placement/frequency: Old NPWT dressing removed with Dr. Lysle Pearl at the bedside for assessment. [place] nurse trimmed JP per surgeon request today Replaced dressing with saline moist kerlix and topped with ABD pads for transfer to the SNF. They will replace dressing with white foam and black foam upon her arrival to the SNF due to the incompatibility of [employer] dressings/tubing with the device they have in the facility.  [place] Nurse ostomy follow up Stoma type/location: RLQ, loop ileostomy Functional stoma at 10 o'clock/ distal limb at 5 o'clock  Stomal assessment/size:  1" x 1 3/4" oval, flush with the skin  Peristomal assessment: dehisced surgical wound at 3 o'clock as described above Treatment options for stomal/peristomal skin: packing open wound with silver hydrofiber, covering with skin barrier of ostomy pouching system Output liquid, seedy green Ostomy pouching: 2pc. 2 3/4" with 2" barrier ring placed around the stoma and over the surgical wound slightly   Enrolled patient in [employer] Discharge program: Yes   Discharge instructions 1. SNF staff to replace NPWT upon arrival to the facility. Cover wound base and JP drain in wound base with cut to fit white foam. Making sure white foam extends slightly into the undermined sections from 12-6 o'clock   2. Fill remainder of wound bed with cut to fit black foam, seal at 39mmHG. Change MWF  3. With pouch changes 3x  week and PRN; Pack open tunneled surgical wound that is lateral to the loop ileostomy with strip of cut Aquacel Ag+ (2 packages sent with patient at DC).  4. Change ostomy pouch PRN and at least 3x per week using 2" 2 3/4" skin barrier and 2 3/4" pouch (extra supplies sent with patient at DC). Will need pouch change with VAC change due to wound care needed under ostomy skin barrier. Cut pouch off center to allow for wound care laterally. Pattern sent with patient at DC.  Supplies sent with patient; ostomy pouching systems, barrier rings, ostomy powder, ABD pad, tape, silver hydrofiber.   [place], [place], [place]

## 2018-09-21 NOTE — Plan of Care (Signed)
  Problem: Education: Goal: Knowledge of General Education information will improve Description Including pain rating scale, medication(s)/side effects and non-pharmacologic comfort measures Outcome: Progressing   Problem: Clinical Measurements: Goal: Ability to maintain clinical measurements within normal limits will improve Outcome: Progressing Goal: Will remain free from infection Outcome: Progressing Goal: Diagnostic test results will improve Outcome: Progressing Goal: Respiratory complications will improve Outcome: Progressing Goal: Cardiovascular complication will be avoided Outcome: Progressing   Problem: Health Behavior/Discharge Planning: Goal: Ability to manage health-related needs will improve Outcome: Progressing   Problem: Activity: Goal: Risk for activity intolerance will decrease Outcome: Progressing   Problem: Nutrition: Goal: Adequate nutrition will be maintained Outcome: Progressing  Tolerating advanced diet.

## 2018-09-21 NOTE — Discharge Summary (Signed)
Physician Discharge Summary  Patient ID: Cassie Carlson MRN: [id number] DOB/AGE: Aug 17, 1946 72 y.o.  Admit date: 09/03/2018 Discharge date: 09/21/2018  Admission Diagnoses: surgical site infection, wound dehiscence   Discharge Diagnoses:  Same as above,  Discharged Condition: good  Hospital Course: 72 year old female status post elective laparoscopic transverse colon resection for a unremovable polyp on August 22, 2018.  She was readmitted to the hospital for this admission due to a surgical site infection at the midline as well as a wound dehiscence.  Initial operation noted a wound dehiscence with a pinhole size leak at the newly created anastomosis.  Due to the extensive scarring in the area that made it impossible to closed the fascial defect and resect the anastomosis to create a fresh one, decision was made to place a wound VAC over a primary closure of the pinhole leak and have the patient heal through secondary intention.  Few days after the initial operation she was noted to have extensive feculent discharge from the area, so she was taken back to the operating room where she was noted to have a larger leak from the anastomosis.  Decision was made at this point to create a loop diverting ileostomy in order to prevent any flow through the leak to facilitate the healing.  Due to the size of the initial defect, concerns of long-term care for an ostomy, as well as patient concerns, we did not opt for a loop ileostomy creation on the first surgery.  However, due to the extent of the leakage noted at the second operation, I explained that there is no way for this to heal without a diverting loop ileostomy.    Postop, she was placed on TPN to facilitate healing of the loop ileostomy site, since a large incision had to be made in the area to created.  The medial aspect of the incision did have to be drained at bedside several days after the second surgery, and is currently being treated with  dressing changes.  An additional attempt was made to close the anastomotic leak at the midline wound during a subsequent wound VAC change, since the healing and granulation process was going well.  The anastomotic leak site was able to be approximated but not fully closed.  At the time of discharge the output from the midline wound VAC as well as the underlying drain has decreased, the ileostomy site remains stable with no evidence of additional infection developing in the open medial aspect, and ileostomy care has been manageable with minimal leakage.  This has been the case after resuming a diet and keeping her off IV antibiotics for couple days.  Plan for is to be transferred to SNF for further wound VAC and ileostomy care, as well as local wound care to the medial aspect of the ileostomy site.  Clinically there is no signs of additional infections at this point so no further antibiotic treatment is needed.  PT is also recommended to further regain strength, until patient and family members feels comfortable enough to transition back home.  I also recommend a readjustment of her depression medications per psychiatrist secondary to increasing anxiety due to her current situation, as well as the recurrent issues of confusion and delirium that was either it intermittently present during her entire hospitalization.  Consults: ID, NUTRITION, WOUND OSTOMY RN  Discharge Exam: Blood pressure 126/68, pulse 72, temperature 98.3 F (36.8 C), temperature source Oral, resp. rate 12, height 5' 3.5" (1.613 m), weight 86.1 kg, SpO2 99 %.  Constitutional :  alert, cooperative, appears stated age and no distress  Respiratory:  clear to auscultation bilaterally  Cardiovascular:  regular rate and rhythm  Gastrointestinal: wound vac in place.  no leakage noted from medial aspect of loop ileostomy site.  Bag with soft brown fluid.  aquacel in place.   Skin: Cool and moist.   Psychiatric: Normal affect, non-agitated,  not confused        Disposition:  Discharge disposition: 03-Skilled [place]       Discharge Instructions    Discharge patient   Complete by:  As directed    Discharge disposition:  03-Skilled [place]   Discharge patient date:  09/21/2018     Allergies as of 09/21/2018      Reactions   Asa [aspirin] Other (See Comments)   GI distress/irritaion   Codeine Other (See Comments)   "Face turned red like a sunburn"   Ibuprofen Other (See Comments)   GI distress/irritation   Nsaids Other (See Comments)   GI distress/irritation   Other Itching   CHG wipes      Medication List    STOP taking these medications   celecoxib 200 MG capsule Commonly known as:  CELEBREX     TAKE these medications   acetaminophen 325 MG tablet Commonly known as:  TYLENOL Take 2 tablets (650 mg total) by mouth every 6 (six) hours as needed for mild pain, fever or headache. What changed:    medication strength  how much to take  reasons to take this   ascorbic acid 500 MG tablet Commonly known as:  VITAMIN C Take 1 tablet (500 mg total) by mouth 2 (two) times daily.   feeding supplement (ENSURE ENLIVE) Liqd Take 237 mLs by mouth 3 (three) times daily between meals.   feeding supplement (PRO-STAT SUGAR FREE 64) Liqd Take 30 mLs by mouth 3 (three) times daily.   levothyroxine 25 MCG tablet Commonly known as:  SYNTHROID, LEVOTHROID Take 25 mcg by mouth daily before breakfast.   multivitamin with minerals Tabs tablet Take 1 tablet by mouth daily.   multivitamin-lutein Caps capsule Take 1 capsule by mouth daily.   RABEprazole 20 MG tablet Commonly known as:  ACIPHEX Take 20 mg by mouth 2 (two) times daily.   ranitidine 150 MG capsule Commonly known as:  ZANTAC Take 150 mg by mouth 2 (two) times daily.   sucralfate 1 g tablet Commonly known as:  CARAFATE Take 1 g by mouth 2 (two) times daily.   traMADol 50 MG tablet Commonly known as:  ULTRAM Take 1  tablet (50 mg total) by mouth every 6 (six) hours as needed for up to 7 days for moderate pain.   venlafaxine XR 75 MG 24 hr capsule Commonly known as:  EFFEXOR-XR Take 150 mg by mouth daily with breakfast.       Contact information for follow-up providers    Lysle Pearl, Rasheen Schewe, DO. Go on 09/28/2018.   Specialty:  Surgery Why:  @1 :30PM Contact information: [place] [address] [phone]            Contact information for after-discharge care    [facility] .   Service:  Skilled Nursing Contact information: [place] [phone]                   Total time spent arranging discharge was >65min. Signed: Benjamine Sprague 09/21/2018, 11:16 AM

## 2018-09-21 NOTE — Discharge Instructions (Signed)
-   Wound vac change every Mon, Wed., Fri..  Clean wound with normal saline and wipe off excess buildup within wound Fill wound with_1__ piece of white foam, then cover with  __1__ piece of black foam. Seal with wound vac dressing at 185mm HG/136mmHG  -Ostomy site- change silver hydrofiber(Aquacell Ag) dressing in tunneled area on medial aspect every 3 days or as needed with ostomy appliance changes  Ostomy pouching: 2pc.2 3/4" cut off center to allow for coverage only of the ostomy site.

## 2018-09-21 NOTE — Clinical Social Work Note (Signed)
Patient discharging today to [facility]. Discharge information sent to [facility] at [facility].CSW coordinated with the wound care nurse and the surgeon here at the hospital regarding getting patient's dressing prior to discharge arranged so patient can be hooked up to the wound vac. Shela Leff MSW,LCSW [phone]

## 2018-09-21 NOTE — Clinical Social Work Placement (Signed)
   CLINICAL SOCIAL WORK PLACEMENT  NOTE  Date:  09/21/2018  Patient Details  Name: Cassie Carlson MRN: [id number] Date of Birth: 1946-10-14  Clinical Social Work is seeking post-discharge placement for this patient at the [place] level of care (*CSW will initial, date and re-position this form in  chart as items are completed):  Yes   Patient/family provided with [facility] Work Department's list of facilities offering this level of care within the geographic area requested by the patient (or if unable, by the patient's family).  Yes   Patient/family informed of their freedom to choose among providers that offer the needed level of care, that participate in Medicare, Medicaid or managed care program needed by the patient, have an available bed and are willing to accept the patient.  Yes   Patient/family informed of [facility]'s ownership interest in [facility] and [facility], as well as of the fact that they are under no obligation to receive care at these facilities.  PASRR submitted to EDS on 09/09/18     PASRR number received on 09/09/18     Existing PASRR number confirmed on       FL2 transmitted to all facilities in geographic area requested by pt/family on       FL2 transmitted to all facilities within larger geographic area on       Patient informed that his/her managed care company has contracts with or will negotiate with certain facilities, including the following:        Yes   Patient/family informed of bed offers received.  Patient chooses bed at [facility])     Physician recommends and patient chooses bed at [facility])    Patient to be transferred to [facility]) on 09/21/18.  Patient to be transferred to facility by (EMS)     Patient family notified on 09/21/18 of transfer.  Name of family member notified:  (daughter)     PHYSICIAN       Additional Comment:     _______________________________________________ Shela Leff, LCSW 09/21/2018, 2:26 PM

## 2018-10-04 ENCOUNTER — Other Ambulatory Visit: Payer: Self-pay

## 2018-10-04 ENCOUNTER — Encounter: Payer: Self-pay | Admitting: Emergency Medicine

## 2018-10-04 ENCOUNTER — Inpatient Hospital Stay
Admission: EM | Admit: 2018-10-04 | Discharge: 2018-10-24 | DRG: 393 | Disposition: A | Discharge status: Home Health | Payer: Medicare Other | Attending: Surgery | Admitting: Surgery

## 2018-10-04 DIAGNOSIS — Y838 Other surgical procedures as the cause of abnormal reaction of the patient, or of later complication, without mention of misadventure at the time of the procedure: Secondary | ICD-10-CM | POA: Diagnosis present

## 2018-10-04 DIAGNOSIS — F419 Anxiety disorder, unspecified: Secondary | ICD-10-CM | POA: Diagnosis present

## 2018-10-04 DIAGNOSIS — Z9049 Acquired absence of other specified parts of digestive tract: Secondary | ICD-10-CM

## 2018-10-04 DIAGNOSIS — F0390 Unspecified dementia without behavioral disturbance: Secondary | ICD-10-CM | POA: Diagnosis present

## 2018-10-04 DIAGNOSIS — Z932 Ileostomy status: Secondary | ICD-10-CM | POA: Diagnosis not present

## 2018-10-04 DIAGNOSIS — K9189 Other postprocedural complications and disorders of digestive system: Secondary | ICD-10-CM | POA: Diagnosis present

## 2018-10-04 DIAGNOSIS — E039 Hypothyroidism, unspecified: Secondary | ICD-10-CM | POA: Diagnosis present

## 2018-10-04 DIAGNOSIS — Z981 Arthrodesis status: Secondary | ICD-10-CM

## 2018-10-04 DIAGNOSIS — R443 Hallucinations, unspecified: Secondary | ICD-10-CM | POA: Diagnosis present

## 2018-10-04 DIAGNOSIS — Z6830 Body mass index (BMI) 30.0-30.9, adult: Secondary | ICD-10-CM | POA: Diagnosis not present

## 2018-10-04 DIAGNOSIS — Z79899 Other long term (current) drug therapy: Secondary | ICD-10-CM | POA: Diagnosis not present

## 2018-10-04 DIAGNOSIS — Y832 Surgical operation with anastomosis, bypass or graft as the cause of abnormal reaction of the patient, or of later complication, without mention of misadventure at the time of the procedure: Secondary | ICD-10-CM | POA: Diagnosis present

## 2018-10-04 DIAGNOSIS — Y92230 Patient room in hospital as the place of occurrence of the external cause: Secondary | ICD-10-CM | POA: Diagnosis not present

## 2018-10-04 DIAGNOSIS — Z86010 Personal history of colonic polyps: Secondary | ICD-10-CM

## 2018-10-04 DIAGNOSIS — Z886 Allergy status to analgesic agent status: Secondary | ICD-10-CM

## 2018-10-04 DIAGNOSIS — K632 Fistula of intestine: Secondary | ICD-10-CM | POA: Diagnosis present

## 2018-10-04 DIAGNOSIS — Z885 Allergy status to narcotic agent status: Secondary | ICD-10-CM | POA: Diagnosis not present

## 2018-10-04 DIAGNOSIS — G4730 Sleep apnea, unspecified: Secondary | ICD-10-CM | POA: Diagnosis present

## 2018-10-04 DIAGNOSIS — R410 Disorientation, unspecified: Secondary | ICD-10-CM | POA: Diagnosis not present

## 2018-10-04 DIAGNOSIS — T8130XA Disruption of wound, unspecified, initial encounter: Secondary | ICD-10-CM | POA: Diagnosis present

## 2018-10-04 DIAGNOSIS — K219 Gastro-esophageal reflux disease without esophagitis: Secondary | ICD-10-CM | POA: Diagnosis present

## 2018-10-04 DIAGNOSIS — Z87891 Personal history of nicotine dependence: Secondary | ICD-10-CM

## 2018-10-04 DIAGNOSIS — F332 Major depressive disorder, recurrent severe without psychotic features: Secondary | ICD-10-CM | POA: Diagnosis not present

## 2018-10-04 DIAGNOSIS — E43 Unspecified severe protein-calorie malnutrition: Secondary | ICD-10-CM | POA: Diagnosis present

## 2018-10-04 DIAGNOSIS — R112 Nausea with vomiting, unspecified: Secondary | ICD-10-CM | POA: Diagnosis present

## 2018-10-04 DIAGNOSIS — T38995A Adverse effect of other hormone antagonists, initial encounter: Secondary | ICD-10-CM | POA: Diagnosis not present

## 2018-10-04 DIAGNOSIS — Z91048 Other nonmedicinal substance allergy status: Secondary | ICD-10-CM | POA: Diagnosis not present

## 2018-10-04 LAB — CBC WITH DIFFERENTIAL/PLATELET
Abs Immature Granulocytes: 0.03 10*3/uL (ref 0.00–0.07)
BASOS PCT: 0 %
Basophils Absolute: 0 10*3/uL (ref 0.0–0.1)
EOS PCT: 0 %
Eosinophils Absolute: 0 10*3/uL (ref 0.0–0.5)
HCT: 34.3 % — ABNORMAL LOW (ref 36.0–46.0)
Hemoglobin: 10.7 g/dL — ABNORMAL LOW (ref 12.0–15.0)
Immature Granulocytes: 1 %
Lymphocytes Relative: 28 %
Lymphs Abs: 1.8 10*3/uL (ref 0.7–4.0)
MCH: 30.1 pg (ref 26.0–34.0)
MCHC: 31.2 g/dL (ref 30.0–36.0)
MCV: 96.3 fL (ref 80.0–100.0)
Monocytes Absolute: 0.6 10*3/uL (ref 0.1–1.0)
Monocytes Relative: 10 %
Neutro Abs: 4 10*3/uL (ref 1.7–7.7)
Neutrophils Relative %: 61 %
Platelets: 390 10*3/uL (ref 150–400)
RBC: 3.56 MIL/uL — ABNORMAL LOW (ref 3.87–5.11)
RDW: 13.8 % (ref 11.5–15.5)
WBC: 6.6 10*3/uL (ref 4.0–10.5)
nRBC: 0 % (ref 0.0–0.2)

## 2018-10-04 LAB — LIPASE, BLOOD: Lipase: 40 U/L (ref 11–51)

## 2018-10-04 LAB — MRSA PCR SCREENING: MRSA by PCR: NEGATIVE

## 2018-10-04 LAB — COMPREHENSIVE METABOLIC PANEL
ALT: 58 U/L — ABNORMAL HIGH (ref 0–44)
AST: 61 U/L — ABNORMAL HIGH (ref 15–41)
Albumin: 3.2 g/dL — ABNORMAL LOW (ref 3.5–5.0)
Alkaline Phosphatase: 72 U/L (ref 38–126)
Anion gap: 7 (ref 5–15)
BUN: 16 mg/dL (ref 8–23)
CO2: 24 mmol/L (ref 22–32)
Calcium: 8.8 mg/dL — ABNORMAL LOW (ref 8.9–10.3)
Chloride: 110 mmol/L (ref 98–111)
Creatinine, Ser: 0.78 mg/dL (ref 0.44–1.00)
GFR calc non Af Amer: >60 mL/min (ref >60)
Glucose, Bld: 130 mg/dL — ABNORMAL HIGH (ref 70–99)
Potassium: 3.8 mmol/L (ref 3.5–5.1)
Sodium: 141 mmol/L (ref 135–145)
TOTAL PROTEIN: 6.7 g/dL (ref 6.5–8.1)
Total Bilirubin: 0.3 mg/dL (ref 0.3–1.2)

## 2018-10-04 LAB — GLUCOSE, CAPILLARY: Glucose-Capillary: 112 mg/dL — ABNORMAL HIGH (ref 70–99)

## 2018-10-04 LAB — CG4 I-STAT (LACTIC ACID): LACTIC ACID, VENOUS: 1.86 mmol/L (ref 0.5–1.9)

## 2018-10-04 MED ORDER — PIPERACILLIN-TAZOBACTAM 3.375 G IVPB
INTRAVENOUS | Status: AC
  Filled 2018-10-04: qty 50

## 2018-10-04 MED ORDER — ONDANSETRON 4 MG PO TBDP
4.0000 mg | ORAL_TABLET | Freq: Four times a day (QID) | ORAL | Status: DC | PRN
  Administered 2018-10-14 – 2018-10-21 (×4): 4 mg via ORAL
  Filled 2018-10-04 (×5): qty 1

## 2018-10-04 MED ORDER — KCL IN DEXTROSE-NACL 40-5-0.45 MEQ/L-%-% IV SOLN
INTRAVENOUS | Status: AC
  Administered 2018-10-04 – 2018-10-05 (×2): via INTRAVENOUS
  Filled 2018-10-04 (×3): qty 1000

## 2018-10-04 MED ORDER — MORPHINE SULFATE (PF) 4 MG/ML IV SOLN
4.0000 mg | Freq: Once | INTRAVENOUS | Status: AC
  Administered 2018-10-04: 2 mg via INTRAVENOUS
  Filled 2018-10-04: qty 1

## 2018-10-04 MED ORDER — OCTREOTIDE ACETATE 50 MCG/ML IJ SOLN
50.0000 ug | Freq: Two times a day (BID) | INTRAMUSCULAR | Status: DC
  Filled 2018-10-04 (×7): qty 1

## 2018-10-04 MED ORDER — MORPHINE SULFATE (PF) 2 MG/ML IV SOLN
2.0000 mg | Freq: Once | INTRAVENOUS | Status: AC
  Administered 2018-10-04: 2 mg via INTRAVENOUS

## 2018-10-04 MED ORDER — LEVOTHYROXINE SODIUM 50 MCG PO TABS
25.0000 ug | ORAL_TABLET | Freq: Every day | ORAL | Status: DC
  Administered 2018-10-05 – 2018-10-24 (×20): 25 ug via ORAL
  Filled 2018-10-04 (×21): qty 1

## 2018-10-04 MED ORDER — ZINC OXIDE 11.3 % EX CREA
TOPICAL_CREAM | Freq: Two times a day (BID) | CUTANEOUS | Status: DC
  Administered 2018-10-04 (×2): via TOPICAL
  Administered 2018-10-05: 1 via TOPICAL
  Administered 2018-10-05: 09:00:00 via TOPICAL
  Filled 2018-10-04: qty 56

## 2018-10-04 MED ORDER — ACETAMINOPHEN 500 MG PO TABS
1000.0000 mg | ORAL_TABLET | Freq: Four times a day (QID) | ORAL | Status: DC
  Administered 2018-10-04 – 2018-10-24 (×25): 1000 mg via ORAL
  Filled 2018-10-04 (×39): qty 2

## 2018-10-04 MED ORDER — IOPAMIDOL (ISOVUE-300) INJECTION 61%: 15.0000 mL | INTRAVENOUS | Status: DC

## 2018-10-04 MED ORDER — TRAMADOL HCL 50 MG PO TABS
50.0000 mg | ORAL_TABLET | Freq: Four times a day (QID) | ORAL | Status: DC | PRN
  Administered 2018-10-06: 50 mg via ORAL
  Filled 2018-10-04 (×2): qty 1

## 2018-10-04 MED ORDER — MORPHINE SULFATE (PF) 2 MG/ML IV SOLN
INTRAVENOUS | Status: AC
  Administered 2018-10-04: 2 mg via INTRAVENOUS
  Filled 2018-10-04: qty 1

## 2018-10-04 MED ORDER — VENLAFAXINE HCL ER 75 MG PO CP24
150.0000 mg | ORAL_CAPSULE | Freq: Every day | ORAL | Status: DC
  Administered 2018-10-05 – 2018-10-19 (×15): 150 mg via ORAL
  Filled 2018-10-04 (×15): qty 2

## 2018-10-04 MED ORDER — ONDANSETRON HCL 4 MG/2ML IJ SOLN
4.0000 mg | Freq: Once | INTRAMUSCULAR | Status: AC
  Administered 2018-10-04: 4 mg via INTRAVENOUS
  Filled 2018-10-04: qty 2

## 2018-10-04 MED ORDER — PIPERACILLIN-TAZOBACTAM 3.375 G IVPB 30 MIN: 3.3750 g | Freq: Once | INTRAVENOUS | Status: DC

## 2018-10-04 MED ORDER — ONDANSETRON HCL 4 MG/2ML IJ SOLN
4.0000 mg | Freq: Four times a day (QID) | INTRAMUSCULAR | Status: DC | PRN
  Administered 2018-10-09 – 2018-10-20 (×12): 4 mg via INTRAVENOUS
  Filled 2018-10-04 (×12): qty 2

## 2018-10-04 MED ORDER — PIPERACILLIN-TAZOBACTAM 3.375 G IVPB 30 MIN
3.3750 g | Freq: Once | INTRAVENOUS | Status: AC
  Administered 2018-10-04: 3.375 g via INTRAVENOUS

## 2018-10-04 MED ORDER — PANTOPRAZOLE SODIUM 40 MG IV SOLR
40.0000 mg | Freq: Every day | INTRAVENOUS | Status: DC
  Administered 2018-10-04 – 2018-10-11 (×8): 40 mg via INTRAVENOUS
  Filled 2018-10-04 (×8): qty 40

## 2018-10-04 MED ORDER — ENOXAPARIN SODIUM 40 MG/0.4ML ~~LOC~~ SOLN
40.0000 mg | SUBCUTANEOUS | Status: DC
  Administered 2018-10-05 – 2018-10-23 (×19): 40 mg via SUBCUTANEOUS
  Filled 2018-10-04 (×19): qty 0.4

## 2018-10-04 NOTE — ED Notes (Signed)
Report given to Yasmin RN. Patient aware of admission and agreeable with plan of care.

## 2018-10-04 NOTE — ED Notes (Signed)
Surgeon at bedside.  

## 2018-10-04 NOTE — ED Notes (Signed)
Cassie Carlson, [phone]

## 2018-10-04 NOTE — ED Notes (Signed)
Dr. Lysle Pearl at bedside dressing wound

## 2018-10-04 NOTE — ED Notes (Signed)
Spoke with Psychologist, sport and exercise. Pt to be admitted. MD to call daughter. Waiting on bed. Orders are in.

## 2018-10-04 NOTE — ED Notes (Signed)
Dr. Lysle Pearl paged at this time per daughter and pt update request.

## 2018-10-04 NOTE — ED Notes (Addendum)
Dr. Lysle Pearl paged at this time for pt update, in surgery at this time and will provide update when complete per nurse

## 2018-10-04 NOTE — ED Notes (Signed)
Joelene Millin RN, aware of bed assigned

## 2018-10-04 NOTE — ED Triage Notes (Addendum)
Pt from [facility] w/ c/o abd pain , pt had recent colon resection with post op dehisced, staff notified stool into JP drainage tube and wound vac. Staff gave  1g IM of Recopehin given prior to arrival. VSS. MD at bedside

## 2018-10-04 NOTE — ED Provider Notes (Signed)
[facility] The Emergency Department Provider Note   ____________________________________________    I have reviewed the triage vital signs and the nursing notes.   HISTORY  Chief Complaint Abdominal Pain     HPI Cassie Carlson is a 72 y.o. female who presents with complaints of abdominal pain.  Patient recently in the hospital for elective partial colon resection, complicated by infection, dehiscence, large anastomotic leak.  Discharge from the hospital on December 4 to rehab.  Today noted stool in JP drain and from wound dehiscence.  Patient is not having fevers.  She reports she had pain relief when the stool evacuated out of her surgical wound earlier today.  Denies nausea or vomiting.  Past Medical History:  Diagnosis Date  . Anxiety   . Arthritis   . Dementia ([place])   . Depression   . Family history of adverse reaction to anesthesia    "siister had issues with heart racing during lung biopsy"  . Family history of breast cancer   . Family history of colon cancer   . Family history of colon cancer   . Family history of prostate cancer   . Family history of prostate cancer   . Fibroids    excessive vaginal bleeding  . GERD (gastroesophageal reflux disease)   . Headache   . Hypothyroidism   . PONV (postoperative nausea and vomiting)   . Sleep apnea     Patient Active Problem List   Diagnosis Date Noted  . Postprocedural intraabdominal abscess   . Large intestine anastomotic leak   . Dehiscence of fascia   . Wound infection after surgery 09/04/2018  . Colon cancer ([place]) 08/22/2018  . Genetic testing 07/29/2018  . Polyposis of colon 07/20/2018  . Family history of breast cancer   . Family history of colon cancer   . Family history of prostate cancer   . Cervical radiculopathy 01/01/2015  . Nonspecific (abnormal) findings on radiological and other examination of gastrointestinal tract 08/18/2012    Past Surgical History:  Procedure  Laterality Date  . ABDOMINAL HYSTERECTOMY    . ANTERIOR CERVICAL DECOMP/DISCECTOMY FUSION N/A 01/01/2015   Procedure: ANTERIOR CERVICAL DECOMPRESSION/DISCECTOMY FUSION CERVICAL FIVE-SIX,CERVICAL SIX-SEVEN;  Surgeon: Karie Chimera, MD;  Location: [place] NEURO ORS;  Service: Neurosurgery;  Laterality: N/A;  . AUGMENTATION MAMMAPLASTY Bilateral 1990  . BREAST SURGERY    . CHOLECYSTECTOMY    . COLON RESECTION N/A 08/22/2018   Procedure: COLON RESECTION LAPAROSCOPIC;  Surgeon: Benjamine Sprague, DO;  Location: [facility] ORS;  Service: General;  Laterality: N/A;  . COLONOSCOPY WITH PROPOFOL N/A 06/14/2018   Procedure: COLONOSCOPY WITH PROPOFOL;  Surgeon: Lollie Sails, MD;  Location: [facility] ENDOSCOPY;  Service: Endoscopy;  Laterality: N/A;  . DILATION AND CURETTAGE OF UTERUS    . ESOPHAGOGASTRODUODENOSCOPY (EGD) WITH PROPOFOL N/A 06/14/2018   Procedure: ESOPHAGOGASTRODUODENOSCOPY (EGD) WITH PROPOFOL;  Surgeon: Lollie Sails, MD;  Location[facility] ENDOSCOPY;  Service: Endoscopy;  Laterality: N/A;  . EUS  08/18/2012   Procedure: UPPER ENDOSCOPIC ULTRASOUND (EUS) LINEAR;  Surgeon: Milus Banister, MD;  Location: [facility] ENDOSCOPY;  Service: Endoscopy;  Laterality: N/A;  . ILEOSTOMY N/A 09/06/2018   Procedure: ILEOSTOMY;  Surgeon: Benjamine Sprague, DO;  Location: [facility] ORS;  Service: General;  Laterality: N/A;  . LAPAROTOMY N/A 09/06/2018   Procedure: EXPLORATORY LAPAROTOMY;  Surgeon: Benjamine Sprague, DO;  Location: [facility] ORS;  Service: General;  Laterality: N/A;  . LAPAROTOMY N/A 09/03/2018   Procedure: EXPLORATORY LAPAROTOMY;  Surgeon: Benjamine Sprague, DO;  Location: [facility] ORS;  Service: General;  Laterality: N/A;  . REDUCTION MAMMAPLASTY Bilateral 1990  . SHOULDER ARTHROSCOPY Left   . VEIN LIGATION AND STRIPPING      Prior to Admission medications   Medication Sig Start Date End Date Taking? Authorizing Provider  levothyroxine (SYNTHROID, LEVOTHROID) 25 MCG tablet Take 25 mcg by mouth daily before breakfast.   Yes [provider]  Multiple Vitamin (MULTIVITAMIN WITH MINERALS) TABS tablet Take 1 tablet by mouth daily. 09/21/18 10/21/18 Yes Sakai, Isami, DO  sucralfate (CARAFATE) 1 g tablet Take 1 g by mouth 2 (two) times daily.   Yes [provider]  venlafaxine XR (EFFEXOR-XR) 75 MG 24 hr capsule Take 150 mg by mouth daily with breakfast.    Yes [provider]  vitamin C (VITAMIN C) 500 MG tablet Take 1 tablet (500 mg total) by mouth 2 (two) times daily. 09/21/18 10/21/18 Yes Sakai, Isami, DO  acetaminophen (TYLENOL) 325 MG tablet Take 2 tablets (650 mg total) by mouth every 6 (six) hours as needed for mild pain, fever or headache. 09/21/18 10/21/18  Lysle Pearl, Isami, DO  Amino Acids-Protein Hydrolys (FEEDING SUPPLEMENT, PRO-STAT SUGAR FREE 64,) LIQD Take 30 mLs by mouth 3 (three) times daily. 09/21/18   Lysle Pearl, Isami, DO  feeding supplement, ENSURE ENLIVE, (ENSURE ENLIVE) LIQD Take 237 mLs by mouth 3 (three) times daily between meals. 09/21/18   Lysle Pearl, Isami, DO  multivitamin-lutein (OCUVITE-LUTEIN) CAPS capsule Take 1 capsule by mouth daily. Patient not taking: Reported on 10/04/2018 09/21/18 10/21/18  Benjamine Sprague, DO  RABEprazole (ACIPHEX) 20 MG tablet Take 20 mg by mouth 2 (two) times daily.    [provider]  ranitidine (ZANTAC) 150 MG capsule Take 150 mg by mouth 2 (two) times daily.     [provider]     Allergies Asa [aspirin]; Codeine; Ibuprofen; Nsaids; and Other  Family History  Problem Relation Age of Onset  . Breast cancer Mother 59  . Lung cancer Mother   . Prostate cancer Father        dx 43's  . Bladder Cancer Father   . Colon cancer Father 61  . Throat cancer Sister   . Lung cancer Sister   . Lung cancer Brother        agent orange, hx of smoking  . Brain cancer Brother   . Leukemia Paternal Aunt   . Prostate cancer Paternal Grandfather 56  . Lymphoma Other 35    Social History Social History   Tobacco Use  . Smoking status: Former Smoker     Packs/day: 2.00    Years: 5.00    Pack years: 10.00    Types: Cigarettes    Last attempt to quit: 10/19/1965    Years since quitting: 52.9  . Smokeless tobacco: Never Used  Substance Use Topics  . Alcohol use: No  . Drug use: Never    Review of Systems  Constitutional: No fever/chills Eyes: No visual changes.  ENT: No sore throat. Cardiovascular: Denies chest pain. Respiratory: Denies shortness of breath. Gastrointestinal: As above Genitourinary: Negative for dysuria. Musculoskeletal: Negative for back pain. Skin: Negative for rash. Neurological: Negative for headaches   ____________________________________________   PHYSICAL EXAM:  VITAL SIGNS: ED Triage Vitals  Enc Vitals Group     BP 10/04/18 1020 (!) 143/58     Pulse Rate 10/04/18 1013 (!) 106     Resp 10/04/18 1013 14     Temp 10/04/18 1013 98.2 F (36.8 C)  Temp Source 10/04/18 1013 Oral     SpO2 10/04/18 1013 97 %     Weight --      Height --      Head Circumference --      Peak Flow --      Pain Score 10/04/18 1107 5     Pain Loc --      Pain Edu? --      Excl. in [place]? --     Constitutional: Alert and oriented.  Eyes: Conjunctivae are normal.   Nose: No congestion/rhinnorhea. Mouth/Throat: Mucous membranes are moist.    Cardiovascular: Normal rate, regular rhythm. Grossly normal heart sounds.  Good peripheral circulation. Respiratory: Normal respiratory effort.  No retractions. Lungs CTAB. Gastrointestinal: JP drain, fecal material, ileostomy fecal material, no current draining of fecal material via wound  Musculoskeletal: Warm and well perfused Neurologic:  Normal speech and language. No gross focal neurologic deficits are appreciated.  Skin:  Skin is warm, dry and intact. No rash noted. Psychiatric: Mood and affect are normal. Speech and behavior are normal.  ____________________________________________   LABS (all labs ordered are listed, but only abnormal results are displayed)  Labs  Reviewed  CBC WITH DIFFERENTIAL/PLATELET - Abnormal; Notable for the following components:      Result Value   RBC 3.56 (*)    Hemoglobin 10.7 (*)    HCT 34.3 (*)    All other components within normal limits  COMPREHENSIVE METABOLIC PANEL - Abnormal; Notable for the following components:   Glucose, Bld 130 (*)    Calcium 8.8 (*)    Albumin 3.2 (*)    AST 61 (*)    ALT 58 (*)    All other components within normal limits  CULTURE, BLOOD (ROUTINE X 2)  CULTURE, BLOOD (ROUTINE X 2)  LIPASE, BLOOD  LACTIC ACID, PLASMA  LACTIC ACID, PLASMA  PROTIME-INR  APTT  CG4 I-STAT (LACTIC ACID)   ____________________________________________  EKG  None ____________________________________________  RADIOLOGY  None ____________________________________________   PROCEDURES  Procedure(s) performed: No  Procedures   Critical Care performed: No ____________________________________________   INITIAL IMPRESSION / ASSESSMENT AND PLAN / ED COURSE  Pertinent labs & imaging results that were available during my care of the patient were reviewed by me and considered in my medical decision making (see chart for details).  Patient presents with fecal material in JP and apparently emerging from wound, concerning for another anastomotic leak.  IV Zosyn given, surgery consulted, Dr. Lysle Pearl will see the patient in the ED    ____________________________________________   FINAL CLINICAL IMPRESSION(S) / ED DIAGNOSES  Final diagnoses:  Large bowel anastomotic leak        Note:  This document was prepared using Dragon voice recognition software and may include unintentional dictation errors.    Lavonia Drafts, MD 10/04/18 [phone]

## 2018-10-05 ENCOUNTER — Inpatient Hospital Stay: Payer: Self-pay

## 2018-10-05 ENCOUNTER — Inpatient Hospital Stay: Payer: Medicare Other

## 2018-10-05 DIAGNOSIS — E43 Unspecified severe protein-calorie malnutrition: Secondary | ICD-10-CM

## 2018-10-05 LAB — DIFFERENTIAL
Abs Immature Granulocytes: 0.03 10*3/uL (ref 0.00–0.07)
BASOS PCT: 0 %
Basophils Absolute: 0 10*3/uL (ref 0.0–0.1)
Eosinophils Absolute: 0 10*3/uL (ref 0.0–0.5)
Eosinophils Relative: 1 %
Immature Granulocytes: 1 %
Lymphocytes Relative: 35 %
Lymphs Abs: 2.2 10*3/uL (ref 0.7–4.0)
MONOS PCT: 12 %
Monocytes Absolute: 0.8 10*3/uL (ref 0.1–1.0)
NEUTROS PCT: 51 %
Neutro Abs: 3.2 10*3/uL (ref 1.7–7.7)

## 2018-10-05 LAB — PREALBUMIN: Prealbumin: 25.1 mg/dL (ref 18–38)

## 2018-10-05 LAB — CBC
HCT: 34.8 % — ABNORMAL LOW (ref 36.0–46.0)
HEMOGLOBIN: 10.9 g/dL — AB (ref 12.0–15.0)
MCH: 30.9 pg (ref 26.0–34.0)
MCHC: 31.3 g/dL (ref 30.0–36.0)
MCV: 98.6 fL (ref 80.0–100.0)
Platelets: 337 10*3/uL (ref 150–400)
RBC: 3.53 MIL/uL — ABNORMAL LOW (ref 3.87–5.11)
RDW: 13.8 % (ref 11.5–15.5)
WBC: 6.3 10*3/uL (ref 4.0–10.5)
nRBC: 0 % (ref 0.0–0.2)

## 2018-10-05 LAB — TRIGLYCERIDES: Triglycerides: 117 mg/dL (ref <150)

## 2018-10-05 LAB — COMPREHENSIVE METABOLIC PANEL
ALT: 51 U/L — ABNORMAL HIGH (ref 0–44)
AST: 45 U/L — ABNORMAL HIGH (ref 15–41)
Albumin: 2.9 g/dL — ABNORMAL LOW (ref 3.5–5.0)
Alkaline Phosphatase: 69 U/L (ref 38–126)
Anion gap: 5 (ref 5–15)
BILIRUBIN TOTAL: 0.5 mg/dL (ref 0.3–1.2)
BUN: 15 mg/dL (ref 8–23)
CO2: 24 mmol/L (ref 22–32)
CREATININE: 0.94 mg/dL (ref 0.44–1.00)
Calcium: 9 mg/dL (ref 8.9–10.3)
Chloride: 112 mmol/L — ABNORMAL HIGH (ref 98–111)
GFR calc Af Amer: >60 mL/min (ref >60)
GFR, EST NON AFRICAN AMERICAN: 60 mL/min — AB (ref >60)
Glucose, Bld: 125 mg/dL — ABNORMAL HIGH (ref 70–99)
Potassium: 5 mmol/L (ref 3.5–5.1)
Sodium: 141 mmol/L (ref 135–145)
Total Protein: 6.3 g/dL — ABNORMAL LOW (ref 6.5–8.1)

## 2018-10-05 LAB — MAGNESIUM: Magnesium: 1.9 mg/dL (ref 1.7–2.4)

## 2018-10-05 LAB — PHOSPHORUS: Phosphorus: 4.4 mg/dL (ref 2.5–4.6)

## 2018-10-05 LAB — GLUCOSE, CAPILLARY: Glucose-Capillary: 137 mg/dL — ABNORMAL HIGH (ref 70–99)

## 2018-10-05 MED ORDER — FAT EMULSION PLANT BASED 20 % IV EMUL
250.0000 mL | INTRAVENOUS | Status: DC
  Filled 2018-10-05: qty 250

## 2018-10-05 MED ORDER — DEXTROSE-NACL 5-0.45 % IV SOLN
INTRAVENOUS | Status: DC
  Administered 2018-10-05: 18:00:00 via INTRAVENOUS

## 2018-10-05 MED ORDER — FAT EMULSION PLANT BASED 20 % IV EMUL
250.0000 mL | INTRAVENOUS | Status: AC
  Administered 2018-10-05: 250 mL via INTRAVENOUS
  Filled 2018-10-05: qty 250

## 2018-10-05 MED ORDER — TRACE MINERALS CR-CU-MN-SE-ZN 10-1000-500-60 MCG/ML IV SOLN
INTRAVENOUS | Status: DC
  Filled 2018-10-05: qty 40

## 2018-10-05 MED ORDER — OCTREOTIDE ACETATE 100 MCG/ML IJ SOLN
50.0000 ug | Freq: Two times a day (BID) | INTRAMUSCULAR | Status: DC
  Administered 2018-10-05 – 2018-10-06 (×3): 50 ug via SUBCUTANEOUS
  Filled 2018-10-05 (×6): qty 0.5

## 2018-10-05 MED ORDER — INSULIN ASPART 100 UNIT/ML ~~LOC~~ SOLN
0.0000 [IU] | Freq: Four times a day (QID) | SUBCUTANEOUS | Status: DC
  Administered 2018-10-05 – 2018-10-10 (×17): 1 [IU] via SUBCUTANEOUS
  Administered 2018-10-11: 3 [IU] via SUBCUTANEOUS
  Administered 2018-10-11: 1 [IU] via SUBCUTANEOUS
  Administered 2018-10-11: 2 [IU] via SUBCUTANEOUS
  Administered 2018-10-12 – 2018-10-21 (×11): 1 [IU] via SUBCUTANEOUS
  Filled 2018-10-05 (×31): qty 1

## 2018-10-05 MED ORDER — SODIUM CHLORIDE 0.9% FLUSH
10.0000 mL | Freq: Two times a day (BID) | INTRAVENOUS | Status: DC
  Administered 2018-10-05 – 2018-10-10 (×12): 10 mL
  Administered 2018-10-11: 30 mL
  Administered 2018-10-11 – 2018-10-17 (×13): 10 mL
  Administered 2018-10-18: 20 mL
  Administered 2018-10-18 – 2018-10-24 (×11): 10 mL

## 2018-10-05 MED ORDER — TRACE MINERALS CR-CU-MN-SE-ZN 10-1000-500-60 MCG/ML IV SOLN
INTRAVENOUS | Status: AC
  Administered 2018-10-05: 18:00:00 via INTRAVENOUS
  Filled 2018-10-05: qty 960

## 2018-10-05 MED ORDER — SODIUM CHLORIDE 0.9% FLUSH: 10.0000 mL | INTRAVENOUS | Status: DC | PRN

## 2018-10-05 NOTE — Progress Notes (Signed)
Initial Nutrition Assessment  DOCUMENTATION CODES:   Severe malnutrition in context of acute illness/injury  INTERVENTION:   Initiate Clinimix 5/15 with electrolytes at 69m/hr + 20% lipids _0 /hr x 12 hrs/day (Goal rate 848mhr once labs stable)   Regimen @ goal rate provides 1894kcal/day, 100g/day protein, 223276molume    Add MVI daily   Add trace elements daily    Add IV thiamine 100m30mily x 3 days  Add vitamin C 500mg66mly    Pt at high refeeding risk; recommend check P, K, and Mg daily for 3 days after TPN iniatition.    Daily weights   Recommend remove additives from IVF and decrease rate to 35ml/79mNUTRITION DIAGNOSIS:   Severe Malnutrition related to acute illness(abdominal wound dehiscence and fistula ) as evidenced by 9 percent weight loss in 3 weeks, severe fat depletion in orbital region, moderate muscle depletions in BLE, energy intake < or equal to 50% for > or equal to 5 days.  GOAL:   Patient will meet greater than or equal to 90% of their needs  MONITOR:   Labs, Weight trends, Skin, I & O's, Other (Comment)(TPN)  REASON FOR ASSESSMENT:   Consult New TPN/TNA  ASSESSMENT:   72 y/o45emale with h/o transverse colectomy for unresectable colon polyp on 08/23/18 recent admission for incisional abscess and wound dehiscence requiring exploratory laparotomy incisional abscess washout, primary closure of anastomosis leak, placement of phasix mesh 11/17; pt now admitted for abdominal pain, stool in JP drain and wound dehiscence   Met with pt in room today. RD familiar with this pt from recent previous admit where pt required TPN from 11/22-12/3. Pt tolerated TPN well with no issues during this admit. Spoke with pt at bedside today. Pt reports that initially after discharge, she was eating well and drinking 1 Ensure per day that she mixed with milk. Pt did not really enjoy the food at the SNF but reports she was eating fairly well for the first 2 weeks or so.  Pt reports that her appetite and oral intake have been severely decreased over the past week. Pt has stopped drinking the Ensure supplements. Per chart, pt with ~15-20lb(9%) wt loss over the past 3 weeks; this is severe. Pt has developed moderate muscle wasting in her BLE and severe fat depletions in her orbital regions. Pt now with severe acute malnutrition. RD received consult for new TPN today; PICC line placed this morning. Suspect pt at high refeeding risk; will monitor electrolytes closely. Will add vitamin C to TPN to support wound healing. Pt may benefit from TPN after discharge as she is unable to keep up with her estimated needs via oral intake.   Medications reviewed and include: lovenox, synthroid, octreotide, protonix, NaCl w/ KCl and 5% dextrose _1 /hr  Labs reviewed: K 5.0 wnl, P 4.4 wnl, Mg 1.9 wnl Prealbumin 25.1 Triglycerides 117  NUTRITION - FOCUSED PHYSICAL EXAM:    Most Recent Value  Orbital Region  Severe depletion  Upper Arm Region  No depletion  Thoracic and Lumbar Region  No depletion  Buccal Region  No depletion  Temple Region  No depletion  Clavicle Bone Region  Mild depletion  Clavicle and Acromion Bone Region  Mild depletion  Scapular Bone Region  No depletion  Dorsal Hand  No depletion  Patellar Region  Moderate depletion  Anterior Thigh Region  Moderate depletion  Posterior Calf Region  Moderate depletion  Edema (RD Assessment)  None  Hair  Reviewed [hair Charma Igowith  flag sign of hair ]  Eyes  Reviewed  Mouth  Reviewed  Skin  Reviewed Vistilia.Gaudier ]  Nails  Reviewed     Diet Order:   Diet Order            Diet NPO time specified Except for: Sips with Meds  Diet effective now             EDUCATION NEEDS:   Education needs have been addressed  Skin:  Skin Assessment: Reviewed RN Assessment(ecchymosis, incision abdomen )  Last BM:  12/17- type 7  Height:   Ht Readings from Last 1 Encounters:  10/04/18 5' 3" (1.6 m)    Weight:   Wt  Readings from Last 1 Encounters:  10/05/18 74.6 kg    Ideal Body Weight:  52.3 kg  BMI:  Body mass index is 29.14 kg/m.  Estimated Nutritional Needs:   Kcal:  1700-1900kcal/day   Protein:  84-94g/day   Fluid:  >1.4L/day   Koleen Distance MS, RD, LDN Pager #- [phone] Office#- [phone] After Hours Pager: [phone]

## 2018-10-05 NOTE — Progress Notes (Signed)
Peripherally Inserted Central Catheter/Midline Placement  The IV Nurse has discussed with the patient and/or persons authorized to consent for the patient, the purpose of this procedure and the potential benefits and risks involved with this procedure.  The benefits include less needle sticks, lab draws from the catheter, and the patient may be discharged home with the catheter. Risks include, but not limited to, infection, bleeding, blood clot (thrombus formation), and puncture of an artery; nerve damage and irregular heartbeat and possibility to perform a PICC exchange if needed/ordered by physician.  Alternatives to this procedure were also discussed.  Bard Power PICC patient education guide, fact sheet on infection prevention and patient information card has been provided to patient /or left at bedside.    PICC/Midline Placement Documentation        Cassie Carlson 10/05/2018, 10:18 AM

## 2018-10-05 NOTE — H&P (Signed)
Subjective:   CC: new enterocutaneous fistula  HPI:  Cassie Carlson is a 72 y.o. female who was consulted by Corky Downs for evaluation of  above. Well known to me for transverse colectomy, subsequent anastamotic leak, midline dehiscence and colocutaneous fistula with loop ileostomy.  Pt noted increased output from midline wound a day prior to admission, soaking through wound vac dressings.  Transferred to ED from [place] where she was residing.  Painful skin irritation, but otherwise doing ok.    Past Medical History:  has a past medical history of Anxiety, Arthritis, Dementia ([place]), Depression, Family history of adverse reaction to anesthesia, Family history of breast cancer, Family history of colon cancer, Family history of colon cancer, Family history of prostate cancer, Family history of prostate cancer, Fibroids, GERD (gastroesophageal reflux disease), Headache, Hypothyroidism, PONV (postoperative nausea and vomiting), and Sleep apnea.  Past Surgical History:  has a past surgical history that includes Abdominal hysterectomy; Vein ligation and stripping; Breast surgery; Cholecystectomy; EUS (08/18/2012); Dilation and curettage of uterus; Anterior cervical decomp/discectomy fusion (N/A, 01/01/2015); Augmentation mammaplasty (Bilateral, 1990); Reduction mammaplasty (Bilateral, 1990); Esophagogastroduodenoscopy (egd) with propofol (N/A, 06/14/2018); Colonoscopy with propofol (N/A, 06/14/2018); Shoulder arthroscopy (Left); Colon resection (N/A, 08/22/2018); laparotomy (N/A, 09/06/2018); Ileostomy (N/A, 09/06/2018); and laparotomy (N/A, 09/03/2018).  Family History: family history includes Bladder Cancer in her father; Brain cancer in her brother; Breast cancer (age of onset: 109) in her mother; Colon cancer (age of onset: 63) in her father; Leukemia in her paternal aunt; Lung cancer in her brother, mother, and sister; Lymphoma (age of onset: 42) in an other family member; Prostate cancer in her father; Prostate cancer  (age of onset: 78) in her paternal grandfather; Throat cancer in her sister.  Social History:  reports that she quit smoking about 52 years ago. Her smoking use included cigarettes. She has a 10.00 pack-year smoking history. She has never used smokeless tobacco. She reports that she does not drink alcohol or use drugs.  Current Medications:  Medications Prior to Admission  Medication Sig Dispense Refill  . levothyroxine (SYNTHROID, LEVOTHROID) 25 MCG tablet Take 25 mcg by mouth daily before breakfast.    . Multiple Vitamin (MULTIVITAMIN WITH MINERALS) TABS tablet Take 1 tablet by mouth daily. 30 tablet 0  . sucralfate (CARAFATE) 1 g tablet Take 1 g by mouth 2 (two) times daily.    Marland Kitchen venlafaxine XR (EFFEXOR-XR) 75 MG 24 hr capsule Take 150 mg by mouth daily with breakfast.     . vitamin C (VITAMIN C) 500 MG tablet Take 1 tablet (500 mg total) by mouth 2 (two) times daily. 60 tablet 0  . acetaminophen (TYLENOL) 325 MG tablet Take 2 tablets (650 mg total) by mouth every 6 (six) hours as needed for mild pain, fever or headache. 90 tablet 0  . Amino Acids-Protein Hydrolys (FEEDING SUPPLEMENT, PRO-STAT SUGAR FREE 64,) LIQD Take 30 mLs by mouth 3 (three) times daily. 900 mL 0  . feeding supplement, ENSURE ENLIVE, (ENSURE ENLIVE) LIQD Take 237 mLs by mouth 3 (three) times daily between meals. 237 mL 12  . multivitamin-lutein (OCUVITE-LUTEIN) CAPS capsule Take 1 capsule by mouth daily. (Patient not taking: Reported on 10/04/2018) 30 capsule 0  . RABEprazole (ACIPHEX) 20 MG tablet Take 20 mg by mouth 2 (two) times daily.    . ranitidine (ZANTAC) 150 MG capsule Take 150 mg by mouth 2 (two) times daily.       Allergies:  Allergies  Allergen Reactions  . Asa [Aspirin] Other (See Comments)  GI distress/irritaion  . Codeine Other (See Comments)    "Face turned red like a sunburn"  . Ibuprofen Other (See Comments)    GI distress/irritation  . Nsaids Other (See Comments)    GI distress/irritation  .  Other Itching    CHG wipes    ROS:  General: positive weight loss and fatigue. No fevers, chills, and night sweats. Eyes: Denies blurry vision, double vision, eye pain, itchy eyes, and tearing. Ears: Denies hearing loss, earache, and ringing in ears. Nose: Denies sinus pain, congestion, infections, runny nose, and nosebleeds. Mouth/throat: Denies hoarseness, sore throat, bleeding gums, and difficulty swallowing. Heart: Denies chest pain, palpitations, racing heart, irregular heartbeat, leg pain or swelling, and decreased activity tolerance. Respiratory: Denies breathing difficulty, shortness of breath, wheezing, cough, and sputum. GI: Denies change in appetite, heartburn, nausea, vomiting, constipation, diarrhea, and blood in stool. GU: Denies difficulty urinating, pain with urinating, urgency, frequency, blood in urine, and heavy menstrual bleeding. Musculoskeletal: Denies joint stiffness, pain, swelling, muscle weakness, and pain. Skin: Denies rash, itching, mass, tumors, sores, and boils Neurologic: Denies headache, fainting, dizziness, seizures, numbness, and tingling. Psychiatric: Denies depression, anxiety, difficulty sleeping, and memory loss. Endocrine: Denies heat or cold intolerance, and increased thirst or urination. Blood/lymph: Denies easy bruising, easy bruising, and swollen glands    Objective:     BP 125/77 (BP Location: Right Arm)   Pulse 77   Temp 97.9 F (36.6 C) (Oral)   Resp 19   Ht 5\' 3"  (1.6 m)   Wt 74.6 kg   SpO2 98%   BMI 29.14 kg/m   Constitutional :  alert, cooperative, appears stated age and no distress  Lymphatics/Throat:  no asymmetry, masses, or scars  Respiratory:  clear to auscultation bilaterally  Cardiovascular:  regular rate and rhythm  Gastrointestinal: Soft no guarding.  Midline wound noted to have a new small bowel fistula inferior to the original colocutaneous fistula.  The entire midline wound itself now measures 8.5 cm x 5 cm x 3 cm  deep small bowel fistula is located in the inferior aspect approximately 2 cm from the patient's right wound border, 1 cm from the left wound border, and approximately 3 cm from the inferior aspect of the wound the fistula itself measures approximately 1 cm in diameter, with active succus coming out of it.  The original fistula remains clean with minimal output noted in the superior portion.  Her loop ileostomy continues to heal well with no depth anymore along the medial aspect of the wound with a intact and productive loop ileostomy site.  She has significant skin maceration surrounding both midline wound and the ileostomy site as well.  Musculoskeletal: Steady gait and movement  Skin: Cool and moist  Psychiatric: Normal affect, non-agitated, not confused       LABS:  CMP Latest Ref Rng & Units 10/05/2018 10/04/2018 09/21/2018  Glucose 70 - 99 mg/dL 125(H) 130(H) 106(H)  BUN 8 - 23 mg/dL 15 16 27(H)  Creatinine 0.44 - 1.00 mg/dL 0.94 0.78 0.68  Sodium 135 - 145 mmol/L 141 141 142  Potassium 3.5 - 5.1 mmol/L 5.0 3.8 5.1  Chloride 98 - 111 mmol/L 112(H) 110 112(H)  CO2 22 - 32 mmol/L 24 24 24   Calcium 8.9 - 10.3 mg/dL 9.0 8.8(L) 8.6(L)  Total Protein 6.5 - 8.1 g/dL 6.3(L) 6.7 -  Total Bilirubin 0.3 - 1.2 mg/dL 0.5 0.3 -  Alkaline Phos 38 - 126 U/L 69 72 -  AST 15 - 41 U/L  45(H) 61(H) -  ALT 0 - 44 U/L 51(H) 58(H) -   CBC Latest Ref Rng & Units 10/05/2018 10/04/2018 09/21/2018  WBC 4.0 - 10.5 K/uL 6.3 6.6 5.9  Hemoglobin 12.0 - 15.0 g/dL 10.9(L) 10.7(L) 8.8(L)  Hematocrit 36.0 - 46.0 % 34.8(L) 34.3(L) 27.9(L)  Platelets 150 - 400 K/uL 337 390 308    RADS: n/a  Assessment:    08/22/18  lap converted to open transverse colectomy for unresectable colon polyp.  09/03/18 exlap, wound washout, Phasix mesh placement over dehiscence from above.    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  TPN for nutritional support in the meantime.  Will try various dressings to midline to control output from fistula and protect surrounding skin, while optimizing nutrition and wound care surrounding it.  No surgical intervention at this time since this will not be manageable surgically.  Will still need to consider possible transfer to tertiary care center if no good solution found for this complex wound.

## 2018-10-05 NOTE — Consult Note (Addendum)
[facility] Nurse ostomy consult note Stoma type/location: Consult requested for ileostomy.  Surgical team is following for assessment and plan of care for abd full thickness wound and fistula. Pt could benefit from application of Eakin pouch with suction catheter attached to wall suction for this location to attempt to contain liquid from fistula and protect skin; please order if desired.  Ileostomy surgery was performed 11/19, according to the EMR. Pt had a full thickness wound located next to the stoma site, according to previous progress notes; this has healed and there is a crease located at 3:00 over that location, presenting a difficult pouching situation. Stomal assessment/size: Stoma is red and viable, flush with skin level, 1 1/4 inches. Peristomal assessment: Skin is slightly macerated, moist and red to 1 cm surrounding the stoma, painful to touch.  Pt also has patchy areas of medical adhesive related skin damage to outer right abd; red and dry  Treatment options for stomal/peristomal skin: Dusted with powder and skin prep to protect skin, applied barrier ring to assist with maintaining a seal over the crease Output: Scant amt green liquid stool in the previous pouch  Ostomy pouching: 1pc.  Education provided:  Demonstrated use of crusting technique, discussed use of barrier ring and convex pouching system instead of a flat pouch.  Applied one piece flexible convex pouch.  If patient becomes more active, then a belt should be added to secure the pouch in place over the crease.  Reviewed troubleshooting techniques with the patient and 3 sets of supplies left at the bedside for staff nurse use.  No family members present to discuss plan of care. Enrolled patient in [place] program: Yes, previous admission [place] team will re-assess pouch seal tomorrow. Julien Girt MSN, RN, [place], [place], [place]

## 2018-10-05 NOTE — Care Management (Signed)
RNCM consult for LTACH.  MD notified that patient does not meet the requirements at this time.  Medicare requires that patients have a 3 night ICU stay to be eligible for [facility]

## 2018-10-05 NOTE — Progress Notes (Signed)
Subjective:  CC:  Cassie Carlson is a 72 y.o. female  Hospital stay day 1,    08/22/18  lap converted to open transverse colectomy for unresectable colon polyp.  09/03/18 exlap, wound washout, Phasix mesh placement over dehiscence from above.   09/06/18 Noted to have increased feculant drainage so returned to OR for wound washout, phasix mesh removal, and loop ileostomy creation  10/05/18 Now noted to have new small bowel fistula within the midline wound granulation bed, with high output  Now admitted for [facility] fistula care  HPI: No specific issues since hospitalization.  Pain under control  ROS:  A 5 point review of systems was performed and pertinent positives and negatives noted in HPI.   Objective:      Temp:  [97.6 F (36.4 C)-98.2 F (36.8 C)] 97.9 F (36.6 C) (12/18 1116) Pulse Rate:  [70-90] 77 (12/18 1116) Resp:  [16-20] 19 (12/18 1116) BP: (119-134)/(53-77) 125/77 (12/18 1116) SpO2:  [95 %-98 %] 98 % (12/18 1116) Weight:  [74.6 kg-151 kg] 74.6 kg (12/18 1116)     Height: 5\' 3"  (160 cm) Weight: 74.6 kg BMI (Calculated): 29.15   Intake/Output this shift:   Intake/Output Summary (Last 24 hours) at 10/05/2018 1810 Last data filed at 10/05/2018 1147 Gross per 24 hour  Intake 56.89 ml  Output 1030 ml  Net -973.11 ml     Constitutional :  alert, cooperative, appears stated age and no distress  Respiratory:  clear to auscultation bilaterally  Cardiovascular:  regular rate and rhythm  Gastrointestinal: soft, non-tender; bowel sounds normal; no masses,  no organomegaly. Midline wound noted to have a new small bowel fistula inferior to the original colocutaneous fistula.  The entire midline wound itself now measures 8.5 cm x 5 cm x 3 cm deep small bowel fistula is located in the inferior aspect approximately 2 cm from the patient's right wound border, 1 cm from the left wound border, and approximately 3 cm from the inferior aspect of the wound the fistula itself measures  approximately 1 cm in diameter, with active succus coming out of it.  The original fistula remains clean with minimal output noted in the superior portion.  Her loop ileostomy continues to heal well with no depth anymore along the medial aspect of the wound with a intact and productive loop ileostomy site.  She has skin maceration that has improved since admission, surrounding both midline wound and the ileostomy site as well.  Skin: Cool and moist.   Psychiatric: Normal affect, non-agitated, not confused       LABS:  CMP Latest Ref Rng & Units 10/05/2018 10/04/2018 09/21/2018  Glucose 70 - 99 mg/dL 125(H) 130(H) 106(H)  BUN 8 - 23 mg/dL 15 16 27(H)  Creatinine 0.44 - 1.00 mg/dL 0.94 0.78 0.68  Sodium 135 - 145 mmol/L 141 141 142  Potassium 3.5 - 5.1 mmol/L 5.0 3.8 5.1  Chloride 98 - 111 mmol/L 112(H) 110 112(H)  CO2 22 - 32 mmol/L 24 24 24   Calcium 8.9 - 10.3 mg/dL 9.0 8.8(L) 8.6(L)  Total Protein 6.5 - 8.1 g/dL 6.3(L) 6.7 -  Total Bilirubin 0.3 - 1.2 mg/dL 0.5 0.3 -  Alkaline Phos 38 - 126 U/L 69 72 -  AST 15 - 41 U/L 45(H) 61(H) -  ALT 0 - 44 U/L 51(H) 58(H) -   CBC Latest Ref Rng & Units 10/05/2018 10/04/2018 09/21/2018  WBC 4.0 - 10.5 K/uL 6.3 6.6 5.9  Hemoglobin 12.0 - 15.0 g/dL 10.9(L) 10.7(L)  8.8(L)  Hematocrit 36.0 - 46.0 % 34.8(L) 34.3(L) 27.9(L)  Platelets 150 - 400 K/uL 337 390 308    RADS: n/a Assessment:    08/22/18  lap converted to open transverse colectomy for unresectable colon polyp.  09/03/18 exlap, wound washout, Phasix mesh placement over dehiscence from above.   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  Stacking of rims around fistula to place pouch  unsuccessful.  Foley cather placement attempted but pushed out even with expanded balloon after only a few hours.  Will continue with blake to wall suction to minimize overflow over wound and attempt eaken bag placement once available.

## 2018-10-05 NOTE — Progress Notes (Signed)
MD made aware that the pouch that was placed on the pt.'s midline is starting to [facility] bile.  MD states he will be by to assess midline, no new verbal orders given at this time. RN will continue to monitor pt.   Cassie Carlson [employer]

## 2018-10-05 NOTE — Consult Note (Signed)
PHARMACY - ADULT TOTAL PARENTERAL NUTRITION CONSULT NOTE   Pharmacy Consult for TPN management Indication: malnutrition  Patient Measurements: Height: 5\' 3"  (160 cm) Weight: 164 lb 8 oz (74.6 kg) IBW/kg (Calculated) : 52.4 TPN AdjBW (KG): 77 Body mass index is 29.14 kg/m.  Assessment: 72 y/o female with h/o transverse colectomy for unresectable colon polyp on 08/23/18 recent admission for incisional abscess and wound dehiscence requiring exploratory laparotomy incisional abscess washout, primary closure of anastomosis leak, placement of phasix mesh 11/17; pt now admitted for abdominal pain, stool in JP drain and wound dehiscence   This patient had a recent admission and received TPN from 11/22-12/3 and tolerated TPN well. Pt reports that her appetite and oral intake have been severely decreased over the past week. She has lost ~15-20lb(9%) wt loss over the past 3 weeks. PICC line placed this morning. Suspect pt at high refeeding risk; will monitor electrolytes closely.   GI:  Endo: sensitive SSI q6h Insulin requirements in the past 24 hours: none Lytes: Renal: Pulm: Cards:  Hepatobil: Neuro: ID: [id number]  TPN Access: 12/18 TPN start date: 12/18 Nutritional Goals (per RD recommendation on 12/18): KCal: 1894 Protein: 100 Fluid: 1132 ml  Goal TPN rate is 83 ml/hr (provides 90% of their needs)  Current Nutrition:   Plan:  E 5/15 TPN at 107mL/hr + 20% lipids at 41mL/hr This TPN provides 48 g of protein, 144 g of dextrose, and 48 g of lipids which provides 912 kCals per day, meeting 48% of patient needs Electrolytes in TPN: E 5/15 Add MVI, trace elements, 500mg  vitC 500mg  and thiamine 100mg /day for 3 days q6h sensitive SSI and adjust as needed D51/2NS at 35 ml/hr Monitor TPN labs P, K and Mg daily for 3 days F/U in am  Dallie Piles, PharmD 10/05/2018,12:14 PM

## 2018-10-06 LAB — COMPREHENSIVE METABOLIC PANEL
ALT: 47 U/L — ABNORMAL HIGH (ref 0–44)
AST: 50 U/L — ABNORMAL HIGH (ref 15–41)
Albumin: 2.7 g/dL — ABNORMAL LOW (ref 3.5–5.0)
Alkaline Phosphatase: 64 U/L (ref 38–126)
Anion gap: 7 (ref 5–15)
BUN: 14 mg/dL (ref 8–23)
CO2: 22 mmol/L (ref 22–32)
Calcium: 8.5 mg/dL — ABNORMAL LOW (ref 8.9–10.3)
Chloride: 109 mmol/L (ref 98–111)
Creatinine, Ser: 0.77 mg/dL (ref 0.44–1.00)
GFR calc Af Amer: >60 mL/min (ref >60)
GFR calc non Af Amer: >60 mL/min (ref >60)
Glucose, Bld: 166 mg/dL — ABNORMAL HIGH (ref 70–99)
Potassium: 4 mmol/L (ref 3.5–5.1)
Sodium: 138 mmol/L (ref 135–145)
Total Bilirubin: 0.3 mg/dL (ref 0.3–1.2)
Total Protein: 6 g/dL — ABNORMAL LOW (ref 6.5–8.1)

## 2018-10-06 LAB — CBC
HCT: 34.5 % — ABNORMAL LOW (ref 36.0–46.0)
Hemoglobin: 10.7 g/dL — ABNORMAL LOW (ref 12.0–15.0)
MCH: 30.4 pg (ref 26.0–34.0)
MCHC: 31 g/dL (ref 30.0–36.0)
MCV: 98 fL (ref 80.0–100.0)
NRBC: 0 % (ref 0.0–0.2)
Platelets: 302 10*3/uL (ref 150–400)
RBC: 3.52 MIL/uL — ABNORMAL LOW (ref 3.87–5.11)
RDW: 13.4 % (ref 11.5–15.5)
WBC: 4.7 10*3/uL (ref 4.0–10.5)

## 2018-10-06 LAB — PREALBUMIN: Prealbumin: 22.2 mg/dL (ref 18–38)

## 2018-10-06 LAB — PHOSPHORUS: Phosphorus: 3.2 mg/dL (ref 2.5–4.6)

## 2018-10-06 LAB — GLUCOSE, CAPILLARY
Glucose-Capillary: 124 mg/dL — ABNORMAL HIGH (ref 70–99)
Glucose-Capillary: 136 mg/dL — ABNORMAL HIGH (ref 70–99)
Glucose-Capillary: 138 mg/dL — ABNORMAL HIGH (ref 70–99)

## 2018-10-06 LAB — MAGNESIUM: Magnesium: 1.8 mg/dL (ref 1.7–2.4)

## 2018-10-06 MED ORDER — TRACE MINERALS CR-CU-MN-SE-ZN 10-1000-500-60 MCG/ML IV SOLN
INTRAVENOUS | Status: AC
  Administered 2018-10-06: 18:00:00 via INTRAVENOUS
  Filled 2018-10-06: qty 1992

## 2018-10-06 MED ORDER — FAT EMULSION PLANT BASED 20 % IV EMUL
250.0000 mL | INTRAVENOUS | Status: AC
  Administered 2018-10-06: 250 mL via INTRAVENOUS
  Filled 2018-10-06: qty 250

## 2018-10-06 NOTE — Consult Note (Addendum)
[facility] Nurse ostomy consult note Stoma type/location: Consult requested to apply Eakin pouch to abd full thickness wound with fistula. Surgical team is following for assessment and plan of care and was previously using dressings and barrier cream to contain the drainage. Removed barrier cream and protected skin with ostomy powder and skin prep, then applied small Eakin pouch over the open wound.  Full thickness wound is approx  8.5X3X1cm with pseudostoma from fistula visible at 6:00 o'clock.  Wound is red with drainage tube in place underneath the skin to low wall suction with mod amt green drainage. Moisture associated skin damage to wound edges related to previous leakage around wound edges to 2 cm. Extra supplies and instructions have been left in the room for staff nurses to change the pouch PRN if leaking occurs. [place] team will re-assess pouch seal tomorrow.  [facility] ostomy consult note Ileostomy surgery was performed 11/19, according to the EMR. Pouch was applied yesterday and is currently intact with good seal. There is a crease located at 3:00 over that location, presenting a difficult pouching situation. Stomal assessment/size: Stoma is red and viable, flush with skin level, 1 1/4 inches. Output: Scant amt green liquid stool in the pouch  Ostomy pouching: 1pc. flexible convex in place, extra supplies and instructions have been left in the room for staff nurses to change PRN if leaking occurs. Enrolled patient in [place] program: Yes, previous admission [place] team will re-assess pouch seal tomorrow. Julien Girt MSN, RN, [place], [place], [place]

## 2018-10-06 NOTE — Progress Notes (Signed)
Initial Nutrition Assessment  DOCUMENTATION CODES:   Severe malnutrition in context of acute illness/injury  INTERVENTION:   Increase Clinimix 5/15 with electrolytes to goal rate of 84ml/hr   Continue 20% lipids @20ml /hr x 12 hrs/day   Regimen @ goal rate provides 1894kcal/day, 100g/day protein, 2243ml volume    Cotninue MVI daily and trace elements daily    Continue IV thiamine 100mg  daily x 2 more days  Continue vitamin C 500mg  daily    Pt at high refeeding risk; recommend check P, K, and Mg daily for 3 days after TPN iniatition.    Daily weights   Discontinue IVF  NUTRITION DIAGNOSIS:   Severe Malnutrition related to acute illness(abdominal wound dehiscence and fistula ) as evidenced by 9 percent weight loss in 3 weeks, severe fat depletion in orbital region, moderate muscle depletions in BLE, energy intake < or equal to 50% for > or equal to 5 days.  GOAL:   Patient will meet greater than or equal to 90% of their needs  MONITOR:   Labs, Weight trends, Skin, I & O's, Other (Comment)(TPN)  REASON FOR ASSESSMENT:   Consult New TPN/TNA  ASSESSMENT:   72 y/o female with h/o transverse colectomy for unresectable colon polyp on 08/23/18 recent admission for incisional abscess and wound dehiscence requiring exploratory laparotomy incisional abscess washout, primary closure of anastomosis leak, placement of phasix mesh 11/17; pt now admitted for abdominal pain, stool in JP drain and wound dehiscence   Pt tolerating TPN well; will advance to goal rate today. 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 Pt would likely benefit from continuing TPN after discharge.   Medications reviewed and include: lovenox, insulin, synthroid, octreotide, protonix  Labs reviewed: K 4.0 wnl, P 3.2 wnl, Mg 1.8 wnl Prealbumin 22.2 Triglycerides 117- 12/18 Hgb 10.7(L), Hct 34.5(L) cbgs- 124, 136 x 24 hrs  Diet Order:   Diet Order             Diet NPO time specified Except for: Sips with Meds  Diet effective now             EDUCATION NEEDS:   Education needs have been addressed  Skin:  Skin Assessment: Reviewed RN Assessment(ecchymosis, incision abdomen )  Last BM:  12/17- type 7  Height:   Ht Readings from Last 1 Encounters:  10/04/18 5\' 3"  (1.6 m)    Weight:   Wt Readings from Last 1 Encounters:  10/05/18 74.6 kg    Ideal Body Weight:  52.3 kg  BMI:  Body mass index is 29.14 kg/m.  Estimated Nutritional Needs:   Kcal:  1700-1900kcal/day   Protein:  84-94g/day   Fluid:  >1.4L/day   Koleen Distance MS, RD, LDN Pager #- [phone] Office#- [phone] After Hours Pager: [phone]

## 2018-10-06 NOTE — Clinical Social Work Note (Addendum)
Clinical Social Work Assessment  Patient Details  Name: Cassie Carlson MRN: [id number] Date of Birth: 07-01-1946  Date of referral:  10/06/18               Reason for consult:  Discharge Planning                Permission sought to share information with:  Facility Art therapist granted to share information::     Name::        Agency::     Relationship::     Contact Information:     Housing/Transportation Living arrangements for the past 2 months:  [place], [place] of Information:  Patient Patient Interpreter Needed:  None Criminal Activity/Legal Involvement Pertinent to Current Situation/Hospitalization:  No - Comment as needed Significant Relationships:  Adult Children Lives with:  Self, Facility Resident Do you feel safe going back to the place where you live?  Yes Need for family participation in patient care:  Yes (Comment)  Care giving concerns:  Patient resides at home alone at baseline but has been at [facility] for wound vac management and physical therapy.    Social Worker assessment / plan:  CSW spoke with patient this afternoon at her bedside and she remembered me from her last admission. Patient is alert and oriented X4 and was willing to engage in conversation. She informed me that she would not be going back to [facility] because they did not have wound nurses who could manage her wound vac. She stated there was only one good one. She stated that the staff did not listen to her when she stated she was not feeling well beginning this week.   CSW spoke with patient about the complex situation regarding discharge planning and that we would work together to determine a safe discharge plan.   Patient currently is not eligible under medicare for LTACH level of care due to not having had a three night stay in the ICU. She is not able to go to any facilities in [place] because none of the facilities in  this county will take TPN due to training of staff and due to reimbursement being very low. There may be a facility in [place] that will accept tpn and wound vac and CSW will call to see.   Employment status:    Insurance information:  Medicare PT Recommendations:    Information / Referral to community resources:     Patient/Family's Response to care:  Patient expressed appreciation for CSW visit.   Patient/Family's Understanding of and Emotional Response to Diagnosis, Current Treatment, and Prognosis:  Patient is hopeful that she will heal quickly and is very motivated to do what needs to be done to be successful in this.   Emotional Assessment Appearance:  Appears stated age Attitude/Demeanor/Rapport:  (pleasant and cooperative) Affect (typically observed):  Calm Orientation:  Oriented to Self, Oriented to Place, Oriented to  Time, Oriented to Situation Alcohol / Substance use:  Not Applicable Psych involvement (Current and /or in the community):  No (Comment)  Discharge Needs  Concerns to be addressed:  Care Coordination Readmission within the last 30 days:  No Current discharge risk:  None Barriers to Discharge:  No Barriers Identified   Shela Leff, LCSW 10/06/2018, 4:15 PM

## 2018-10-06 NOTE — Consult Note (Addendum)
PHARMACY - ADULT TOTAL PARENTERAL NUTRITION CONSULT NOTE   Pharmacy Consult for TPN management Indication: malnutrition  Patient Measurements: Height: 5\' 3"  (160 cm) Weight: 164 lb 8 oz (74.6 kg) IBW/kg (Calculated) : 52.4 TPN AdjBW (KG): 77 Body mass index is 29.14 kg/m.  Assessment: 72 y/o female with h/o transverse colectomy for unresectable colon polyp on 08/23/18 recent admission for incisional abscess and wound dehiscence requiring exploratory laparotomy incisional abscess washout, primary closure of anastomosis leak, placement of phasix mesh 11/17; pt now admitted for abdominal pain, stool in JP drain and wound dehiscence   This patient had a recent admission and received TPN from 11/22-12/3 and tolerated TPN well. Pt reports that her appetite and oral intake have been severely decreased over the past week. She has lost ~15-20lb(9%) wt loss over the past 3 weeks. PICC line placed this morning. Suspect pt at high refeeding risk; will monitor electrolytes closely.   GI:  Endo: sensitive SSI q6h Insulin requirements in the past 24 hours: 2 Lytes: WNL Renal: Pulm: Cards:  Hepatobil: Neuro: ID: [id number] wnl  TPN Access: 12/18 TPN start date: 12/18 Nutritional Goals (per RD recommendation on 12/18): Regimen @ goal rate provides 1894kcal/day, 100g/day protein, 2241ml volume   Goal TPN rate is 83 ml/hr (provides 90% of their needs)  Current Nutrition:   Plan:  Clinimix E 5/15 TPN - increase to goal rate 83 mL/hr + 20% lipids at 73mL/hr Additional Electrolytes in TPN: none Add MVI, trace elements, 500mg  vitC 500mg  and thiamine 100mg /day for 3 days- 10/06/18 is Day 2 of Thiamine. q6h sensitive SSI and adjust as needed IVF discontinued now Monitor TPN labs P, K and Mg daily for 3 days F/U in am  Brytnee Bechler A, PharmD 10/06/2018,9:13 AM

## 2018-10-06 NOTE — Progress Notes (Signed)
Subjective:  CC:  Cassie Carlson is a 72 y.o. female  Hospital stay day 2,    08/22/18  lap converted to open transverse colectomy for unresectable colon polyp.  09/03/18 exlap, wound washout, Phasix mesh placement over dehiscence from above.   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  Pt is in better spirits today.  No pain.  ROS:  A 5 point review of systems was performed and pertinent positives and negatives noted in HPI.   Objective:      Temp:  [97.9 F (36.6 C)-98.5 F (36.9 C)] 98.5 F (36.9 C) (12/19 0435) Pulse Rate:  [65-77] 73 (12/19 0435) Resp:  [18-19] 18 (12/19 0435) BP: (121-132)/(68-77) 121/68 (12/19 0435) SpO2:  [95 %-98 %] 95 % (12/19 0435) Weight:  [74.6 kg-74.7 kg] 74.6 kg (12/18 1116)     Height: 5\' 3"  (160 cm) Weight: 74.6 kg BMI (Calculated): 29.15   Intake/Output this shift:   Intake/Output Summary (Last 24 hours) at 10/06/2018 1046 Last data filed at 10/06/2018 0954 Gross per 24 hour  Intake 1828.51 ml  Output 1530 ml  Net 298.51 ml     Constitutional :  alert, cooperative, appears stated age and no distress  Respiratory:  clear to auscultation bilaterally  Cardiovascular:  regular rate and rhythm  Gastrointestinal: soft, non-tender; bowel sounds normal; no masses,  no organomegaly. Midline wound covered in Eakin pouch and ileostomy pouch intact.  Skin: Cool and moist.   Psychiatric: Normal affect, non-agitated, not confused       LABS:  CMP Latest Ref Rng & Units 10/06/2018 10/05/2018 10/04/2018  Glucose 70 - 99 mg/dL 166(H) 125(H) 130(H)  BUN 8 - 23 mg/dL 14 15 16   Creatinine 0.44 - 1.00 mg/dL 0.77 0.94 0.78  Sodium 135 - 145 mmol/L  138 141 141  Potassium 3.5 - 5.1 mmol/L 4.0 5.0 3.8  Chloride 98 - 111 mmol/L 109 112(H) 110  CO2 22 - 32 mmol/L 22 24 24   Calcium 8.9 - 10.3 mg/dL 8.5(L) 9.0 8.8(L)  Total Protein 6.5 - 8.1 g/dL 6.0(L) 6.3(L) 6.7  Total Bilirubin 0.3 - 1.2 mg/dL 0.3 0.5 0.3  Alkaline Phos 38 - 126 U/L 64 69 72  AST 15 - 41 U/L 50(H) 45(H) 61(H)  ALT 0 - 44 U/L 47(H) 51(H) 58(H)   CBC Latest Ref Rng & Units 10/06/2018 10/05/2018 10/04/2018  WBC 4.0 - 10.5 K/uL 4.7 6.3 6.6  Hemoglobin 12.0 - 15.0 g/dL 10.7(L) 10.9(L) 10.7(L)  Hematocrit 36.0 - 46.0 % 34.5(L) 34.8(L) 34.3(L)  Platelets 150 - 400 K/uL 302 337 390    RADS: n/a Assessment:    08/22/18  lap converted to open transverse colectomy for unresectable colon polyp.  09/03/18 exlap, wound washout, Phasix mesh placement over dehiscence from above.   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  If Eakin pouch remains intact and fistula output can be controlled with bag  alone, will remove blake drain, possibly tomorrow.  Will coordinate closely with wound care.

## 2018-10-07 LAB — GLUCOSE, CAPILLARY
GLUCOSE-CAPILLARY: 129 mg/dL — AB (ref 70–99)
Glucose-Capillary: 120 mg/dL — ABNORMAL HIGH (ref 70–99)
Glucose-Capillary: 134 mg/dL — ABNORMAL HIGH (ref 70–99)
Glucose-Capillary: 121 mg/dL — ABNORMAL HIGH (ref 70–99)

## 2018-10-07 LAB — BASIC METABOLIC PANEL
ANION GAP: 6 (ref 5–15)
BUN: 20 mg/dL (ref 8–23)
CHLORIDE: 107 mmol/L (ref 98–111)
CO2: 26 mmol/L (ref 22–32)
Calcium: 8.5 mg/dL — ABNORMAL LOW (ref 8.9–10.3)
Creatinine, Ser: 0.6 mg/dL (ref 0.44–1.00)
GFR calc Af Amer: >60 mL/min (ref >60)
GFR calc non Af Amer: >60 mL/min (ref >60)
Glucose, Bld: 97 mg/dL (ref 70–99)
Potassium: 4 mmol/L (ref 3.5–5.1)
Sodium: 139 mmol/L (ref 135–145)

## 2018-10-07 LAB — CBC
HCT: 30.6 % — ABNORMAL LOW (ref 36.0–46.0)
HEMOGLOBIN: 9.8 g/dL — AB (ref 12.0–15.0)
MCH: 30.2 pg (ref 26.0–34.0)
MCHC: 32 g/dL (ref 30.0–36.0)
MCV: 94.2 fL (ref 80.0–100.0)
Platelets: 275 10*3/uL (ref 150–400)
RBC: 3.25 MIL/uL — AB (ref 3.87–5.11)
RDW: 13.1 % (ref 11.5–15.5)
WBC: 5.6 10*3/uL (ref 4.0–10.5)
nRBC: 0 % (ref 0.0–0.2)

## 2018-10-07 LAB — MAGNESIUM: Magnesium: 1.9 mg/dL (ref 1.7–2.4)

## 2018-10-07 LAB — PHOSPHORUS: Phosphorus: 3.7 mg/dL (ref 2.5–4.6)

## 2018-10-07 MED ORDER — LOPERAMIDE HCL 2 MG PO CAPS
4.0000 mg | ORAL_CAPSULE | Freq: Two times a day (BID) | ORAL | Status: DC
  Administered 2018-10-07 – 2018-10-09 (×6): 4 mg via ORAL
  Filled 2018-10-07 (×6): qty 2

## 2018-10-07 MED ORDER — TRACE MINERALS CR-CU-MN-SE-ZN 10-1000-500-60 MCG/ML IV SOLN
INTRAVENOUS | Status: AC
  Administered 2018-10-07: 18:00:00 via INTRAVENOUS
  Filled 2018-10-07: qty 1992

## 2018-10-07 MED ORDER — OCTREOTIDE LOAD VIA INFUSION: 50.0000 ug | Freq: Two times a day (BID) | INTRAVENOUS | Status: DC

## 2018-10-07 MED ORDER — KETOROLAC TROMETHAMINE 15 MG/ML IJ SOLN
15.0000 mg | Freq: Four times a day (QID) | INTRAMUSCULAR | Status: AC | PRN
  Administered 2018-10-07 – 2018-10-12 (×2): 15 mg via INTRAVENOUS
  Filled 2018-10-07 (×2): qty 1

## 2018-10-07 MED ORDER — OCTREOTIDE ACETATE 100 MCG/ML IJ SOLN
50.0000 ug | Freq: Two times a day (BID) | INTRAMUSCULAR | Status: DC
  Administered 2018-10-07 – 2018-10-08 (×2): 50 ug via INTRAVENOUS
  Filled 2018-10-07 (×3): qty 0.5

## 2018-10-07 MED ORDER — OCTREOTIDE ACETATE 100 MCG/ML IJ SOLN
50.0000 ug | Freq: Two times a day (BID) | INTRAMUSCULAR | Status: DC
  Filled 2018-10-07: qty 0.5

## 2018-10-07 MED ORDER — FAT EMULSION PLANT BASED 20 % IV EMUL
240.0000 mL | INTRAVENOUS | Status: AC
  Administered 2018-10-07: 240 mL via INTRAVENOUS
  Filled 2018-10-07: qty 240

## 2018-10-07 NOTE — Care Management Important Message (Signed)
Copy of signed IM left with patient in room.  

## 2018-10-07 NOTE — Consult Note (Addendum)
PHARMACY - ADULT TOTAL PARENTERAL NUTRITION CONSULT NOTE   Pharmacy Consult for TPN management Indication: malnutrition  Patient Measurements: Height: 5\' 3"  (160 cm) Weight: 164 lb 8 oz (74.6 kg) IBW/kg (Calculated) : 52.4 TPN AdjBW (KG): 77 Body mass index is 29.14 kg/m.  Assessment: 72 y/o female with h/o transverse colectomy for unresectable colon polyp on 08/23/18 recent admission for incisional abscess and wound dehiscence requiring exploratory laparotomy incisional abscess washout, primary closure of anastomosis leak, placement of phasix mesh 11/17; pt now admitted for abdominal pain, stool in JP drain and wound dehiscence   This patient had a recent admission and received TPN from 11/22-12/3 and tolerated TPN well. Pt reports that her appetite and oral intake have been severely decreased over the past week. She has lost ~15-20lb(9%) wt loss over the past 3 weeks. PICC line placed this morning. Suspect pt at high refeeding risk; will monitor electrolytes closely.   GI:  Endo: sensitive SSI q6h - BG 120-138 Insulin requirements in the past 24 hours: 3 Lytes: K 4.0, Mag 1.9, Phos 3.7, Ca 8.5, Alb 2.7 (12/19), CorrCa 9.5 Renal: SCr 0.60 ID: WBC wnl  TPN Access: 12/18 TPN start date: 12/18 Nutritional Goals (per RD recommendation on 12/18): Regimen @ goal rate provides 1894kcal/day, 100g/day protein, 2283ml volume   Goal TPN rate is 83 ml/hr (provides 90% of their needs)  Current Nutrition: NPO except sips with meds + TPN  Plan:  Continue Clinimix E 5/15 TPN at goal rate of 83 mL/hr + 20% lipids at 74mL/hr (no changes today per dietitian)  Additional Electrolytes in TPN: none Add MVI, trace elements, vitC 500 g and thiamine 100mg /day for 3 days- 10/07/18 is Day 3 of Thiamine. Continue q6h sensitive SSI and adjust as needed  Monitor TPN labs P, K and Mg at least daily for 3 days F/U in am  Rocky Morel, PharmD 10/07/2018,7:26 AM

## 2018-10-07 NOTE — Clinical Social Work Note (Signed)
CSW has sent out patient's referral to [facility] as there are no facilities in [facility] that will take TPN. Patient will require TPN and has an Eakin's pouch on her surgical site rather than a wound vac.  Shela Leff MSW,LCSW [phone]

## 2018-10-07 NOTE — Progress Notes (Signed)
Subjective:  CC:  Cassie Carlson is a 72 y.o. female  Hospital stay day 3,    08/22/18  lap converted to open transverse colectomy for unresectable colon polyp.  09/03/18 exlap, wound washout, Phasix mesh placement over dehiscence from above.   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  Remained intact with no leak.  Output noted in bag   ROS:  A 5 point review of systems was performed and pertinent positives and negatives noted in HPI.   Objective:      Temp:  [97.3 F (36.3 C)-98.4 F (36.9 C)] 97.3 F (36.3 C) (12/20 0444) Pulse Rate:  [70-72] 70 (12/20 0444) Resp:  [17-20] 20 (12/20 0444) BP: (119-138)/(62-68) 138/62 (12/20 0444) SpO2:  [96 %-98 %] 98 % (12/20 0444)     Height: 5\' 3"  (160 cm) Weight: 74.6 kg BMI (Calculated): 29.15   Intake/Output this shift:   Intake/Output Summary (Last 24 hours) at 10/07/2018 4098 Last data filed at 10/07/2018 1191 Gross per 24 hour  Intake 1247.1 ml  Output 2975 ml  Net -1727.9 ml   568ml/24hr of fistula output  Constitutional :  alert, cooperative, appears stated age and no distress  Respiratory:  clear to auscultation bilaterally  Cardiovascular:  regular rate and rhythm  Gastrointestinal: soft, non-tender; bowel sounds normal; no masses,  no organomegaly. Midline wound covered in Eakin pouch and ileostomy pouch intact.  Skin: Cool and moist.   Psychiatric: Normal affect, non-agitated, not confused       LABS:  CMP Latest Ref Rng & Units 10/07/2018 10/06/2018 10/05/2018  Glucose 70 - 99 mg/dL 97 166(H) 125(H)  BUN 8 - 23 mg/dL 20 14 15   Creatinine 0.44 - 1.00 mg/dL 0.60 0.77 0.94  Sodium 135 - 145 mmol/L 139 138 141  Potassium  3.5 - 5.1 mmol/L 4.0 4.0 5.0  Chloride 98 - 111 mmol/L 107 109 112(H)  CO2 22 - 32 mmol/L 26 22 24   Calcium 8.9 - 10.3 mg/dL 8.5(L) 8.5(L) 9.0  Total Protein 6.5 - 8.1 g/dL - 6.0(L) 6.3(L)  Total Bilirubin 0.3 - 1.2 mg/dL - 0.3 0.5  Alkaline Phos 38 - 126 U/L - 64 69  AST 15 - 41 U/L - 50(H) 45(H)  ALT 0 - 44 U/L - 47(H) 51(H)   CBC Latest Ref Rng & Units 10/07/2018 10/06/2018 10/05/2018  WBC 4.0 - 10.5 K/uL 5.6 4.7 6.3  Hemoglobin 12.0 - 15.0 g/dL 9.8(L) 10.7(L) 10.9(L)  Hematocrit 36.0 - 46.0 % 30.6(L) 34.5(L) 34.8(L)  Platelets 150 - 400 K/uL 275 302 337    RADS: n/a Assessment:    08/22/18  lap converted to open transverse colectomy for unresectable colon polyp.  09/03/18 exlap, wound washout, Phasix mesh placement over dehiscence from above.   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   NPO, TPN and local wound care. Eakin pouch remains intact, and discussed with wound care nurse.  Blake drain removed at bedside and site  covered.  Will place Eiken bag to drainage.

## 2018-10-07 NOTE — Evaluation (Signed)
Physical Therapy Evaluation Patient Details Name: Cassie Carlson MRN: [id number] DOB: 23-Mar-1946 Today's Date: 10/07/2018   History of Present Illness  72 y/o female with h/o transverse colectomy for unresectable colon polyp on 08/23/18 recent admission for incisional abscess and wound dehiscence requiring exploratory laparotomy incisional abscess washout, primary closure of anastomosis leak, placement of phasix mesh 11/17; pt now admitted for abdominal pain, stool in JP drain and wound dehiscence.   Clinical Impression  Patient agreeable to PT, cognitively WFLs. Pt demonstrated bed mobility with handheld assist due to anxiety and abdominal pain. Transferred from multi-surfaces this session with handheld assist. Pt complained of fatigue/weakness in LE with mobility, reported she has not been up in about 1 week. Pt slow, cautious, no LOB noted but exhibited some fatigue at end of mobility. The patient would benefit from further skilled PT to address limitations (see "PT Problem List") and return to PLOF. Recommendation is STR due to mobility, level of assistance needed, decreased caregiver support, and inaccessible home environment.    Follow Up Recommendations SNF    Equipment Recommendations  Other (comment)(TBD)    Recommendations for Other Services       Precautions / Restrictions Precautions Precautions: Fall Precaution Comments: abdominal incision with wound suction, colostomy; Restrictions Weight Bearing Restrictions: No      Mobility  Bed Mobility Overal bed mobility: Needs Assistance Bed Mobility: Supine to Sit     Supine to sit: Min assist     General bed mobility comments: Handheld assist for bed mobility, pt cautious, but physical assist not required  Transfers Overall transfer level: Needs assistance Equipment used: 1 person hand held assist Transfers: Sit to/from Stand Sit to Stand: Min guard            Ambulation/Gait   Gait Distance (Feet): 5  Feet Assistive device: 1 person hand held assist   Gait velocity: decreased   General Gait Details: slow shuffling step, careful, cautious. Handheld assist for balance. Pt fatigued quickly  Stairs            Wheelchair Mobility    Modified Rankin (Stroke Patients Only)       Balance   Sitting-balance support: Feet supported Sitting balance-Leahy Scale: Good       Standing balance-Leahy Scale: Fair                               Pertinent Vitals/Pain Pain Assessment: Faces Faces Pain Scale: Hurts a little bit Pain Location: surgical site Pain Descriptors / Indicators: Guarding;Grimacing Pain Intervention(s): Limited activity within patient's tolerance;Monitored during session;Repositioned    Home Living Family/patient expects to be discharged to:: Private residence Living Arrangements: Alone Available Help at Discharge: Friend(s);Available PRN/intermittently Type of Home: House Home Access: Stairs to enter Entrance Stairs-Rails: Can reach both Entrance Stairs-Number of Steps: 4 Home Layout: One level Home Equipment: Walker - 2 wheels;Shower seat - built in      Prior Function Level of Independence: Independent         Comments: Pt with recent ED admissions. First returned home with HHPT, readmitted and discharged to SNF for the last few weeks.     Hand Dominance        Extremity/Trunk Assessment   Upper Extremity Assessment Upper Extremity Assessment: Generalized weakness    Lower Extremity Assessment Lower Extremity Assessment: Generalized weakness    Cervical / Trunk Assessment Cervical / Trunk Assessment: Normal  Communication   Communication: No difficulties  Cognition Arousal/Alertness: Awake/alert Behavior During Therapy: WFL for tasks assessed/performed Overall Cognitive Status: Within Functional Limits for tasks assessed                                        General Comments      Exercises      Assessment/Plan    PT Assessment Patient needs continued PT services  PT Problem List Decreased strength;Decreased range of motion;Decreased activity tolerance;Decreased balance;Decreased mobility;Decreased coordination;Decreased cognition;Decreased knowledge of use of DME;Decreased safety awareness;Obesity;Decreased skin integrity;Pain       PT Treatment Interventions DME instruction;Gait training;Stair training;Functional mobility training;Therapeutic activities;Therapeutic exercise;Balance training;Neuromuscular re-education;Patient/family education    PT Goals (Current goals can be found in the Care Plan section)  Acute Rehab PT Goals Patient Stated Goal: to improve mobility PT Goal Formulation: With patient Time For Goal Achievement: 10/21/18 Potential to Achieve Goals: Fair    Frequency Min 2X/week   Barriers to discharge Decreased caregiver support;Inaccessible home environment      Co-evaluation               AM-PAC PT "6 Clicks" Mobility  Outcome Measure Help needed turning from your back to your side while in a flat bed without using bedrails?: A Little Help needed moving from lying on your back to sitting on the side of a flat bed without using bedrails?: A Little Help needed moving to and from a bed to a chair (including a wheelchair)?: A Little Help needed standing up from a chair using your arms (e.g., wheelchair or bedside chair)?: A Little Help needed to walk in hospital room?: A Little Help needed climbing 3-5 steps with a railing? : A Lot 6 Click Score: 17    End of Session Equipment Utilized During Treatment: Gait belt Activity Tolerance: Patient tolerated treatment well Patient left: with chair alarm set;with SCD's reapplied;in chair Nurse Communication: Mobility status PT Visit Diagnosis: Unsteadiness on feet (R26.81);Muscle weakness (generalized) (M62.81);Other abnormalities of gait and mobility (R26.89)    Time: 9450-3888 PT Time Calculation  (min) (ACUTE ONLY): 34 min   Charges:   PT Evaluation $PT Eval Moderate Complexity: 1 Mod PT Treatments $Therapeutic Activity: 8-22 mins        Lieutenant Diego PT, DPT 4:44 PM,10/07/18 [phone]

## 2018-10-07 NOTE — Plan of Care (Signed)
Drain have been holding well without leaking. The patient was up in the chair today. TPN and fats provided. Pain has been managed better towards the end of the shift.  Problem: Education: Goal: Knowledge of General Education information will improve Description Including pain rating scale, medication(s)/side effects and non-pharmacologic comfort measures Outcome: Progressing   Problem: Health Behavior/Discharge Planning: Goal: Ability to manage health-related needs will improve Outcome: Progressing   Problem: Clinical Measurements: Goal: Ability to maintain clinical measurements within normal limits will improve Outcome: Progressing Goal: Will remain free from infection Outcome: Progressing Goal: Diagnostic test results will improve Outcome: Progressing Goal: Respiratory complications will improve Outcome: Progressing Goal: Cardiovascular complication will be avoided Outcome: Progressing   Problem: Activity: Goal: Risk for activity intolerance will decrease Outcome: Progressing   Problem: Nutrition: Goal: Adequate nutrition will be maintained Outcome: Progressing   Problem: Coping: Goal: Level of anxiety will decrease Outcome: Progressing   Problem: Elimination: Goal: Will not experience complications related to bowel motility Outcome: Progressing Goal: Will not experience complications related to urinary retention Outcome: Progressing   Problem: Pain Managment: Goal: General experience of comfort will improve Outcome: Progressing   Problem: Safety: Goal: Ability to remain free from injury will improve Outcome: Progressing   Problem: Skin Integrity: Goal: Risk for impaired skin integrity will decrease Outcome: Progressing

## 2018-10-07 NOTE — NC FL2 (Signed)
[name] LEVEL OF CARE SCREENING TOOL     IDENTIFICATION  Patient Name: Cassie Carlson Birthdate: 11/02/1945 Sex: female Admission Date (Current Location): 10/04/2018  [facility] and [place] Number:  Engineering geologist and Address:  [facility], [address] [place], [place] [address]      Provider Number: [id number]  Attending Physician Name and Address:  Benjamine Sprague, DO  Relative Name and Phone Number:       Current Level of Care: Hospital Recommended Level of Care: [place] Prior Approval Number:    Date Approved/Denied:   PASRR Number:    Discharge Plan: SNF    Current Diagnoses: Patient Active Problem List   Diagnosis Date Noted  . Protein-calorie malnutrition, severe 10/05/2018  . 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 Postprocedural intraabdominal abscess   . Large intestine anastomotic leak   . Dehiscence of fascia   . Wound infection after surgery 09/04/2018  . Colon cancer ([place]) 08/22/2018  . Genetic testing 07/29/2018  . Polyposis of colon 07/20/2018  . Family history of breast cancer   . Family history of colon cancer   . Family history of prostate cancer   . Cervical radiculopathy 01/01/2015  . Nonspecific (abnormal) findings on radiological and other examination of gastrointestinal tract 08/18/2012    Orientation RESPIRATION BLADDER Height & Weight     Self, Time, Situation, Place  Normal Continent Weight: 164 lb 8 oz (74.6 kg) Height:  5\' 3"  (160 cm)  BEHAVIORAL SYMPTOMS/MOOD NEUROLOGICAL BOWEL NUTRITION STATUS  (none) (none) Continent (Will require TPN)  AMBULATORY STATUS COMMUNICATION OF NEEDS Skin   Limited Assist Verbally Surgical wounds(at discharge will require an Eakin's Pouch over the surgical site/ similar to a colostomy)                       Personal Care Assistance Level of Assistance  Bathing, Feeding, Dressing Bathing Assistance: Limited assistance Feeding  assistance: Independent Dressing Assistance: Limited assistance     Functional Limitations Info  (no issues)          SPECIAL CARE FACTORS FREQUENCY  PT (By licensed PT)                    Contractures Contractures Info: Not present    Additional Factors Info  Code Status Code Status Info: dnr             Current Medications (10/07/2018):  This is the current hospital active medication list Current Facility-Administered Medications  Medication Dose Route Frequency Provider Last Rate Last Dose  . Marland KitchenTPN (CLINIMIX-E) Adult   Intravenous Continuous TPN Sakai, Isami, DO 83 mL/hr at 10/07/18 0300    . acetaminophen (TYLENOL) tablet 1,000 mg  1,000 mg Oral Q6H Sakai, Isami, DO   1,000 mg at 10/07/18 1238  . enoxaparin (LOVENOX) injection 40 mg  40 mg Subcutaneous Q24H Sakai, Isami, DO   40 mg at 10/06/18 2135  . insulin aspart (novoLOG) injection 0-9 Units  0-9 Units Subcutaneous Q6H Dallie Piles, RPH   1 Units at 10/07/18 1234  . ketorolac (TORADOL) 15 MG/ML injection 15 mg  15 mg Intravenous Q6H PRN Sakai, Isami, DO      . levothyroxine (SYNTHROID, LEVOTHROID) tablet 25 mcg  25 mcg Oral Q0600 Sakai, Isami, DO   25 mcg at 10/07/18 0543  . loperamide (IMODIUM) capsule 4 mg  4 mg Oral BID Sakai, Isami, DO   4 mg at 10/07/18 1019  .  octreotide (SANDOSTATIN) injection 50 mcg  50 mcg Subcutaneous Q12H Sakai, Isami, DO   50 mcg at 10/06/18 1235  . ondansetron (ZOFRAN-ODT) disintegrating tablet 4 mg  4 mg Oral Q6H PRN Lysle Pearl, Isami, DO       Or  . ondansetron (ZOFRAN) injection 4 mg  4 mg Intravenous Q6H PRN Sakai, Isami, DO      . pantoprazole (PROTONIX) injection 40 mg  40 mg Intravenous QHS Sakai, Isami, DO   40 mg at 10/06/18 2201  . sodium chloride flush (NS) 0.9 % injection 10-40 mL  10-40 mL Intracatheter Q12H Sakai, Isami, DO   10 mL at 10/07/18 1011  . sodium chloride flush (NS) 0.9 % injection 10-40 mL  10-40 mL Intracatheter PRN Lysle Pearl, Isami, DO      . traMADol  (ULTRAM) tablet 50 mg  50 mg Oral Q6H PRN Sakai, Isami, DO   50 mg at 10/06/18 1452  . venlafaxine XR (EFFEXOR-XR) 24 hr capsule 150 mg  150 mg Oral Q breakfast Sakai, Isami, DO   150 mg at 10/07/18 4801     Discharge Medications: Please see discharge summary for a list of discharge medications.  Relevant Imaging Results:  Relevant Lab Results:   Additional Information ss: [id number]  Shela Leff, LCSW

## 2018-10-07 NOTE — Consult Note (Addendum)
[place] Nurse wound follow up Surgical team in earlier to remove suction tube from right abd. Full thickness wound, .2X.2X.2cm, mod amt tan drainage, no odor.  Applied Alginate and Tegaderm to site and topical treatment orders provided for staff nurses to perform daily.  Ostomy pouch intact with good seal, scant amt tan drainage. Supplies and instructions in the room for staff nurses to perform PRN if leaking.   Wound type: Eakin pouch intact to full thickness abd wound, inserted red rubber suction catheter to low wall suction inside pouch now that the other tube has been removed. Reviewed plan of care with patient. Supplies and instructions in the room for staff nurses to perform PRN if leaking.   [place] team will continue to follow patient on Monday. Julien Girt MSN, RN, [place], [place], [place]

## 2018-10-08 DIAGNOSIS — K632 Fistula of intestine: Secondary | ICD-10-CM

## 2018-10-08 LAB — CBC
HCT: 31.6 % — ABNORMAL LOW (ref 36.0–46.0)
Hemoglobin: 10.1 g/dL — ABNORMAL LOW (ref 12.0–15.0)
MCH: 30.1 pg (ref 26.0–34.0)
MCHC: 32 g/dL (ref 30.0–36.0)
MCV: 94 fL (ref 80.0–100.0)
PLATELETS: 295 10*3/uL (ref 150–400)
RBC: 3.36 MIL/uL — ABNORMAL LOW (ref 3.87–5.11)
RDW: 13.2 % (ref 11.5–15.5)
WBC: 5.6 10*3/uL (ref 4.0–10.5)
nRBC: 0 % (ref 0.0–0.2)

## 2018-10-08 LAB — GLUCOSE, CAPILLARY
GLUCOSE-CAPILLARY: 135 mg/dL — AB (ref 70–99)
Glucose-Capillary: 118 mg/dL — ABNORMAL HIGH (ref 70–99)
Glucose-Capillary: 131 mg/dL — ABNORMAL HIGH (ref 70–99)
Glucose-Capillary: 136 mg/dL — ABNORMAL HIGH (ref 70–99)
Glucose-Capillary: 142 mg/dL — ABNORMAL HIGH (ref 70–99)

## 2018-10-08 LAB — PHOSPHORUS: Phosphorus: 4.3 mg/dL (ref 2.5–4.6)

## 2018-10-08 LAB — BASIC METABOLIC PANEL
Anion gap: 5 (ref 5–15)
BUN: 28 mg/dL — AB (ref 8–23)
CO2: 25 mmol/L (ref 22–32)
Calcium: 8.4 mg/dL — ABNORMAL LOW (ref 8.9–10.3)
Chloride: 108 mmol/L (ref 98–111)
Creatinine, Ser: 0.78 mg/dL (ref 0.44–1.00)
GFR calc Af Amer: >60 mL/min (ref >60)
Glucose, Bld: 133 mg/dL — ABNORMAL HIGH (ref 70–99)
Potassium: 3.9 mmol/L (ref 3.5–5.1)
Sodium: 138 mmol/L (ref 135–145)

## 2018-10-08 LAB — MAGNESIUM: Magnesium: 1.9 mg/dL (ref 1.7–2.4)

## 2018-10-08 MED ORDER — TRACE MINERALS CR-CU-MN-SE-ZN 10-1000-500-60 MCG/ML IV SOLN
INTRAVENOUS | Status: AC
  Administered 2018-10-08: 19:00:00 via INTRAVENOUS
  Filled 2018-10-08: qty 1992

## 2018-10-08 MED ORDER — FAT EMULSION PLANT BASED 20 % IV EMUL
240.0000 mL | INTRAVENOUS | Status: AC
  Administered 2018-10-08: 240 mL via INTRAVENOUS
  Filled 2018-10-08: qty 240

## 2018-10-08 MED ORDER — OCTREOTIDE ACETATE 100 MCG/ML IJ SOLN
100.0000 ug | Freq: Two times a day (BID) | INTRAMUSCULAR | Status: DC
  Administered 2018-10-08 – 2018-10-13 (×9): 100 ug via INTRAVENOUS
  Filled 2018-10-08 (×11): qty 1

## 2018-10-08 NOTE — Consult Note (Signed)
PHARMACY - ADULT TOTAL PARENTERAL NUTRITION CONSULT NOTE   Pharmacy Consult for TPN management Indication: malnutrition  Patient Measurements: Height: 5\' 3"  (160 cm) Weight: 166 lb 10.7 oz (75.6 kg) IBW/kg (Calculated) : 52.4 TPN AdjBW (KG): 77 Body mass index is 29.52 kg/m.  Assessment: 72 y/o female with h/o transverse colectomy for unresectable colon polyp on 08/23/18 recent admission for incisional abscess and wound dehiscence requiring exploratory laparotomy incisional abscess washout, primary closure of anastomosis leak, placement of phasix mesh 11/17; pt now admitted for abdominal pain, stool in JP drain and wound dehiscence   This patient had a recent admission and received TPN from 11/22-12/3 and tolerated TPN well. Pt reports that her appetite and oral intake have been severely decreased over the past week. She has lost ~15-20lb(9%) wt loss over the past 3 weeks. PICC line placed this morning. Suspect pt at high refeeding risk; will monitor electrolytes closely.   GI:  Endo: sensitive SSI q6h - BG 120-138 Insulin requirements in the past 24 hours: 4 Lytes: WNL Renal: SCr 0.60 ID: [id number]  TPN Access: 12/18 TPN start date: 12/18 Nutritional Goals (per RD recommendation on 12/18): Regimen @ goal rate provides 1894kcal/day, 100g/day protein, 2214ml volume   Goal TPN rate is 83 ml/hr (provides 90% of their needs)  Current Nutrition: NPO except sips with meds + TPN  Plan:  Continue Clinimix E 5/15 TPN at goal rate of 83 mL/hr + 20% lipids at 28mL/hr (no changes today per dietitian)  Additional Electrolytes in TPN: none Add MVI, trace elements, vitC 500 g and thiamine 100mg /day for 3 days- 10/07/18 was Day 3 of Thiamine. Continue q6h sensitive SSI and adjust as needed  Monitor TPN labs P, K and Mg at least daily for 3 days then Mon,Thur if WNL F/U electrolytes on Mon 12/23.  Lejuan Botto A, PharmD 10/08/2018,10:30 AM

## 2018-10-08 NOTE — Progress Notes (Signed)
Is a patient of Dr. Ines Bloomer, seen on rounds today.  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  She was recently readmitted from her rehabilitation center.  She had abdominal pain as well as concern for worsening drainage from her fistula site, that was not well controlled by her current management system.  She now has an Eakin pouch in place, which seems to be controlling the drainage better.  He has been initiated on octreotide to try and bring the volume of drainage down.  Past Medical History:  Diagnosis Date  . Anxiety   . Arthritis   . Dementia ([place])   . Depression   . Family history of adverse reaction to anesthesia    "siister had issues with heart racing during lung biopsy"  . Family history of breast cancer   . Family history of colon cancer   . Family history of colon cancer   . Family history of prostate cancer   . Family history of prostate cancer   . Fibroids    excessive vaginal bleeding  . GERD (gastroesophageal reflux disease)   . Headache   . Hypothyroidism   . PONV (postoperative nausea and vomiting)   . Sleep apnea    Past Surgical History:  Procedure Laterality Date  . ABDOMINAL HYSTERECTOMY    . ANTERIOR CERVICAL DECOMP/DISCECTOMY FUSION N/A 01/01/2015   Procedure: ANTERIOR CERVICAL DECOMPRESSION/DISCECTOMY FUSION CERVICAL FIVE-SIX,CERVICAL SIX-SEVEN;  Surgeon: Karie Chimera, MD;  Location: [place] [facility];  Service: Neurosurgery;  Laterality: N/A;  . AUGMENTATION MAMMAPLASTY Bilateral 1990  . BREAST SURGERY    . CHOLECYSTECTOMY    . COLON RESECTION N/A 08/22/2018   Procedure: COLON RESECTION LAPAROSCOPIC;  Surgeon: Benjamine Sprague, DO;  Location: [facility] ORS;  Service: General;  Laterality: N/A;  . COLONOSCOPY WITH PROPOFOL N/A 06/14/2018   Procedure: COLONOSCOPY WITH PROPOFOL;  Surgeon: Lollie Sails, MD;  Location: [facility] ENDOSCOPY;  Service: Endoscopy;  Laterality: N/A;  . DILATION AND CURETTAGE  OF UTERUS    . ESOPHAGOGASTRODUODENOSCOPY (EGD) WITH PROPOFOL N/A 06/14/2018   Procedure: ESOPHAGOGASTRODUODENOSCOPY (EGD) WITH PROPOFOL;  Surgeon: Lollie Sails, MD;  [facility] ENDOSCOPY;  Service: Endoscopy;  Laterality: N/A;  . EUS  08/18/2012   Procedure: UPPER ENDOSCOPIC ULTRASOUND (EUS) LINEAR;  Surgeon: Milus Banister, MD;  Location: [facility] ENDOSCOPY;  Service: Endoscopy;  Laterality: N/A;  . ILEOSTOMY N/A 09/06/2018   Procedure: ILEOSTOMY;  Surgeon: Benjamine Sprague, DO;  Location: [facility] ORS;  Service: General;  Laterality: N/A;  . LAPAROTOMY N/A 09/06/2018   Procedure: EXPLORATORY LAPAROTOMY;  Surgeon: Benjamine Sprague, DO;  Location: [facility] ORS;  Service: General;  Laterality: N/A;  . LAPAROTOMY N/A 09/03/2018   Procedure: EXPLORATORY LAPAROTOMY;  Surgeon: Benjamine Sprague, DO;  Location: [facility] ORS;  Service: General;  Laterality: N/A;  . REDUCTION MAMMAPLASTY Bilateral 1990  . SHOULDER ARTHROSCOPY Left   . VEIN LIGATION AND STRIPPING     Family History  Problem Relation Age of Onset  . Breast cancer Mother 59  . Lung cancer Mother   . Prostate cancer Father        dx 13's  . Bladder Cancer Father   . Colon cancer Father 68  . Throat cancer Sister   . Lung cancer Sister   . Lung cancer Brother        agent orange, hx of smoking  . Brain cancer Brother   . Leukemia Paternal Aunt   . Prostate cancer Paternal Grandfather 42  .  Lymphoma Other 35   Social History   Tobacco Use  . Smoking status: Former Smoker    Packs/day: 2.00    Years: 5.00    Pack years: 10.00    Types: Cigarettes    Last attempt to quit: 10/19/1965    Years since quitting: 53.0  . Smokeless tobacco: Never Used  Substance Use Topics  . Alcohol use: No  . Drug use: Never   Current Meds  Medication Sig  . levothyroxine (SYNTHROID, LEVOTHROID) 25 MCG tablet Take 25 mcg by mouth daily before breakfast.  . Multiple Vitamin (MULTIVITAMIN WITH MINERALS) TABS tablet Take 1 tablet by mouth daily.  . sucralfate  (CARAFATE) 1 g tablet Take 1 g by mouth 2 (two) times daily.  Marland Kitchen venlafaxine XR (EFFEXOR-XR) 75 MG 24 hr capsule Take 150 mg by mouth daily with breakfast.   . vitamin C (VITAMIN C) 500 MG tablet Take 1 tablet (500 mg total) by mouth 2 (two) times daily.   Allergies  Allergen Reactions  . Asa [Aspirin] Other (See Comments)    GI distress/irritaion  . Codeine Other (See Comments)    "Face turned red like a sunburn"  . Ibuprofen Other (See Comments)    GI distress/irritation  . Nsaids Other (See Comments)    GI distress/irritation  . Other Itching    CHG wipes   Vitals:   10/07/18 1950 10/08/18 0538  BP: 127/64 (!) 117/58  Pulse: 69 68  Resp: 17 20  Temp: 98.2 F (36.8 C) 98.6 F (37 C)  SpO2: 96% 96%   I/O last 3 completed shifts: In: 3300.4 [I.V.:3300.4] Out: 8676 [Urine:3525; Drains:950] Total I/O In: -  Out: 800 [Urine:800]   Focused abdominal exam: Her abdomen is soft.  It is appropriately tender around her wound sites.  The ileostomy site is covered with an opaque pouch.  There is little to no drainage present within that bag.  The midline enterocutaneous fistula site is pouched with a red rubber catheter attached to suction.  There is yellowish drainage material in the suction canister.  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  Currently this is being managed with a pouch attached to wall suction.  The current output has been 950 cc over the past 3 shifts.  She is on 50 mcg of octreotide twice daily.  I will increase this to 100 mcg twice daily, in an effort to bring the volume of drainage down to a goal of less than 200 cc/day.

## 2018-10-08 NOTE — Evaluation (Signed)
Occupational Therapy Evaluation Patient Details Name: Cassie Carlson MRN: [id number] DOB: Oct 06, 1946 Today's Date: 10/08/2018    History of Present Illness Pt. is a 72 y/o female with h/o transverse colectomy for unresectable colon polyp on 08/23/18 recent admission for incisional abscess and wound dehiscence requiring exploratory laparotomy incisional abscess washout, primary closure of anastomosis leak, placement of phasix mesh 11/17; pt now admitted for abdominal pain, stool in JP drain and wound dehiscence.    Clinical Impression   Pt. presents with weakness, limited activity tolerance, and limited functional mobility which limits her ability to complete basic ADL and IADL functioning. Pt. resides at home alone.  Pt. required assist with ADL, and IADL tasks following her initial abdominal surgery. Prior to the initial surgery. Pt. was independent with ADLs, and IADLs. Pt. was receiving therapy at [facility]. Pt. education was provided about A/E use for LE ADL tasks. Pt. Reports she is familiar with the equipment, and was using it at SNF. Pt. could benefit from OT services for ADL training, A/E training, and pt. education about home modification, and DME. Pt. would benefit from SNF level of care upon discharge, with follow-up OT services at discharge.    Follow Up Recommendations  SNF    Equipment Recommendations       Recommendations for Other Services       Precautions / Restrictions Precautions Precautions: Fall Precaution Comments: abdominal incision with wound suction, colostomy; Restrictions Weight Bearing Restrictions: No      Mobility Bed Mobility                  Transfers Overall transfer level: Needs assistance Equipment used: 1 person hand held assist Transfers: Sit to/from Stand Sit to Stand: Min guard         General transfer comment: Mobility per PT report    Balance                                           ADL either  performed or assessed with clinical judgement   ADL Overall ADL's : Needs assistance/impaired Eating/Feeding: NPO   Grooming: Independent;Set up   Upper Body Bathing: Set up;Minimal assistance   Lower Body Bathing: Set up;Maximal assistance   Upper Body Dressing : Set up;Minimal assistance   Lower Body Dressing: Set up;Maximal assistance                 General ADL Comments: Pt. edcuation was provided about A/E use for LE ADLs.     Vision         Perception     Praxis      Pertinent Vitals/Pain Pain Assessment: 0-10     Hand Dominance Right   Extremity/Trunk Assessment Upper Extremity Assessment Upper Extremity Assessment: Generalized weakness           Communication Communication Communication: No difficulties   Cognition Arousal/Alertness: Awake/alert Behavior During Therapy: WFL for tasks assessed/performed                                       General Comments       Exercises     Shoulder Instructions      Home Living Family/patient expects to be discharged to:: Private residence Living Arrangements: Alone Available Help at Discharge: Family;Available 24 hours/day;Available PRN/intermittently Type  of Home: House Home Access: Stairs to enter   Entrance Stairs-Rails: Can reach both Home Layout: One level     Bathroom Shower/Tub: Occupational psychologist: Handicapped height Bathroom Accessibility: Yes   Home Equipment: Environmental consultant - 2 wheels;Shower seat - built in          Prior Functioning/Environment Level of Independence: Independent        Comments: Independent prior to the initial surgery. Reports multiple surgeries over past several months, and has required assist since that time.        OT Problem List: Decreased strength;Decreased activity tolerance;Impaired UE functional use;Decreased range of motion      OT Treatment/Interventions: Self-care/ADL training;Therapeutic exercise;DME and/or AE  instruction;Therapeutic activities;Patient/family education    OT Goals(Current goals can be found in the care plan section)    OT Frequency: Min 1X/week   Barriers to D/C:            Co-evaluation              AM-PAC OT "6 Clicks" Daily Activity     Outcome Measure Help from another person eating meals?: (NPO) Help from another person taking care of personal grooming?: None Help from another person toileting, which includes using toliet, bedpan, or urinal?: A Little Help from another person bathing (including washing, rinsing, drying)?: A Lot Help from another person to put on and taking off regular upper body clothing?: A Little Help from another person to put on and taking off regular lower body clothing?: A Lot 6 Click Score: 14   End of Session Equipment Utilized During Treatment: Gait belt  Activity Tolerance: Patient tolerated treatment well Patient left: with call bell/phone within reach;in bed;with bed alarm set  OT Visit Diagnosis: Muscle weakness (generalized) (M62.81)                Time: 7062-3762 OT Time Calculation (min): 18 min Charges:  OT General Charges $OT Visit: 1 Visit OT Evaluation $OT Eval Low Complexity: 1 Low  Harrel Carina, MS, OTR/L   Harrel Carina 10/08/2018, 12:48 PM

## 2018-10-09 LAB — CULTURE, BLOOD (ROUTINE X 2)
Culture: NO GROWTH
Culture: NO GROWTH
Special Requests: ADEQUATE
Special Requests: ADEQUATE

## 2018-10-09 LAB — CBC
HCT: 32.4 % — ABNORMAL LOW (ref 36.0–46.0)
Hemoglobin: 10.3 g/dL — ABNORMAL LOW (ref 12.0–15.0)
MCH: 30.2 pg (ref 26.0–34.0)
MCHC: 31.8 g/dL (ref 30.0–36.0)
MCV: 95 fL (ref 80.0–100.0)
Platelets: 276 10*3/uL (ref 150–400)
RBC: 3.41 MIL/uL — ABNORMAL LOW (ref 3.87–5.11)
RDW: 13.3 % (ref 11.5–15.5)
WBC: 5.9 10*3/uL (ref 4.0–10.5)
nRBC: 0 % (ref 0.0–0.2)

## 2018-10-09 LAB — BASIC METABOLIC PANEL
Anion gap: 6 (ref 5–15)
BUN: 28 mg/dL — ABNORMAL HIGH (ref 8–23)
CO2: 25 mmol/L (ref 22–32)
CREATININE: 0.66 mg/dL (ref 0.44–1.00)
Calcium: 8.5 mg/dL — ABNORMAL LOW (ref 8.9–10.3)
Chloride: 107 mmol/L (ref 98–111)
GFR calc non Af Amer: >60 mL/min (ref >60)
Glucose, Bld: 135 mg/dL — ABNORMAL HIGH (ref 70–99)
Potassium: 4 mmol/L (ref 3.5–5.1)
SODIUM: 138 mmol/L (ref 135–145)

## 2018-10-09 LAB — GLUCOSE, CAPILLARY
Glucose-Capillary: 149 mg/dL — ABNORMAL HIGH (ref 70–99)
Glucose-Capillary: 147 mg/dL — ABNORMAL HIGH (ref 70–99)
Glucose-Capillary: 128 mg/dL — ABNORMAL HIGH (ref 70–99)

## 2018-10-09 LAB — MAGNESIUM: Magnesium: 2 mg/dL (ref 1.7–2.4)

## 2018-10-09 LAB — PHOSPHORUS: Phosphorus: 4.2 mg/dL (ref 2.5–4.6)

## 2018-10-09 MED ORDER — FAT EMULSION PLANT BASED 20 % IV EMUL
240.0000 mL | INTRAVENOUS | Status: AC
  Administered 2018-10-09: 240 mL via INTRAVENOUS
  Filled 2018-10-09: qty 240

## 2018-10-09 MED ORDER — TRACE MINERALS CR-CU-MN-SE-ZN 10-1000-500-60 MCG/ML IV SOLN
INTRAVENOUS | Status: AC
  Administered 2018-10-09: 18:00:00 via INTRAVENOUS
  Filled 2018-10-09: qty 1992

## 2018-10-09 NOTE — Progress Notes (Signed)
Patient of Dr. Ines Carlson, seen on rounds today.  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  She was recently readmitted from her rehabilitation center.  She had abdominal pain as well as concern for worsening drainage from her fistula site, that was not well controlled by her current management system.  She now has an Eakin pouch in place, which seems to be controlling the drainage better.  She has been initiated on octreotide to try and bring the volume of drainage down. Yesterday, I increased the dose, with a goal of <200 cc/day. She has only received one administration of the higher dose.  Past Medical History:  Diagnosis Date  . Anxiety   . Arthritis   . Dementia ([place])   . Depression   . Family history of adverse reaction to anesthesia    "siister had issues with heart racing during lung biopsy"  . Family history of breast cancer   . Family history of colon cancer   . Family history of colon cancer   . Family history of prostate cancer   . Family history of prostate cancer   . Fibroids    excessive vaginal bleeding  . GERD (gastroesophageal reflux disease)   . Headache   . Hypothyroidism   . PONV (postoperative nausea and vomiting)   . Sleep apnea    Past Surgical History:  Procedure Laterality Date  . ABDOMINAL HYSTERECTOMY    . ANTERIOR CERVICAL DECOMP/DISCECTOMY FUSION N/A 01/01/2015   Procedure: ANTERIOR CERVICAL DECOMPRESSION/DISCECTOMY FUSION CERVICAL FIVE-SIX,CERVICAL SIX-SEVEN;  Surgeon: Karie Chimera, MD;  Location: [place] [facility];  Service: Neurosurgery;  Laterality: N/A;  . AUGMENTATION MAMMAPLASTY Bilateral 1990  . BREAST SURGERY    . CHOLECYSTECTOMY    . COLON RESECTION N/A 08/22/2018   Procedure: COLON RESECTION LAPAROSCOPIC;  Surgeon: Benjamine Sprague, DO;  Location: [facility] ORS;  Service: General;  Laterality: N/A;  . COLONOSCOPY WITH PROPOFOL N/A 06/14/2018   Procedure: COLONOSCOPY WITH PROPOFOL;  Surgeon:  Lollie Sails, MD;  Location: [facility] ENDOSCOPY;  Service: Endoscopy;  Laterality: N/A;  . DILATION AND CURETTAGE OF UTERUS    . ESOPHAGOGASTRODUODENOSCOPY (EGD) WITH PROPOFOL N/A 06/14/2018   Procedure: ESOPHAGOGASTRODUODENOSCOPY (EGD) WITH PROPOFOL;  Surgeon: Lollie Sails, MD;  Location: [facility] ENDOSCOPY;  Service: Endoscopy;  Laterality: N/A;  . EUS  08/18/2012   Procedure: UPPER ENDOSCOPIC ULTRASOUND (EUS) LINEAR;  Surgeon: Milus Banister, MD;  Location: [facility] ENDOSCOPY;  Service: Endoscopy;  Laterality: N/A;  . ILEOSTOMY N/A 09/06/2018   Procedure: ILEOSTOMY;  Surgeon: Benjamine Sprague, DO;  Location: [facility] ORS;  Service: General;  Laterality: N/A;  . LAPAROTOMY N/A 09/06/2018   Procedure: EXPLORATORY LAPAROTOMY;  Surgeon: Benjamine Sprague, DO;  Location: [facility] ORS;  Service: General;  Laterality: N/A;  . LAPAROTOMY N/A 09/03/2018   Procedure: EXPLORATORY LAPAROTOMY;  Surgeon: Benjamine Sprague, DO;  Location: [facility] ORS;  Service: General;  Laterality: N/A;  . REDUCTION MAMMAPLASTY Bilateral 1990  . SHOULDER ARTHROSCOPY Left   . VEIN LIGATION AND STRIPPING     Family History  Problem Relation Age of Onset  . Breast cancer Mother 4  . Lung cancer Mother   . Prostate cancer Father        dx 75's  . Bladder Cancer Father   . Colon cancer Father 81  . Throat cancer Sister   . Lung cancer Sister   . Lung cancer Brother        agent orange, hx of smoking  .  Brain cancer Brother   . Leukemia Paternal Aunt   . Prostate cancer Paternal Grandfather 95  . Lymphoma Other 35   Social History   Tobacco Use  . Smoking status: Former Smoker    Packs/day: 2.00    Years: 5.00    Pack years: 10.00    Types: Cigarettes    Last attempt to quit: 10/19/1965    Years since quitting: 53.0  . Smokeless tobacco: Never Used  Substance Use Topics  . Alcohol use: No  . Drug use: Never   Current Meds  Medication Sig  . levothyroxine (SYNTHROID, LEVOTHROID) 25 MCG tablet Take 25 mcg by mouth daily before  breakfast.  . Multiple Vitamin (MULTIVITAMIN WITH MINERALS) TABS tablet Take 1 tablet by mouth daily.  . sucralfate (CARAFATE) 1 g tablet Take 1 g by mouth 2 (two) times daily.  Marland Kitchen venlafaxine XR (EFFEXOR-XR) 75 MG 24 hr capsule Take 150 mg by mouth daily with breakfast.   . vitamin C (VITAMIN C) 500 MG tablet Take 1 tablet (500 mg total) by mouth 2 (two) times daily.   Allergies  Allergen Reactions  . Asa [Aspirin] Other (See Comments)    GI distress/irritaion  . Codeine Other (See Comments)    "Face turned red like a sunburn"  . Ibuprofen Other (See Comments)    GI distress/irritation  . Nsaids Other (See Comments)    GI distress/irritation  . Other Itching    CHG wipes   Vitals:   10/08/18 2022 10/09/18 0408  BP: 126/60 111/69  Pulse: 63 71  Resp: 12 13  Temp: 98.2 F (36.8 C) 98.5 F (36.9 C)  SpO2: 99% 96%   I/O last 3 completed shifts: In: 4368.9 [I.V.:4368.9] Out: 3600 [Urine:3250; Drains:350] Total I/O In: 10 [I.V.:10] Out: -    Focused abdominal exam: Her abdomen is soft.  It is appropriately tender around her wound sites.  The ileostomy site is covered with an opaque pouch.  There is little to no drainage present within that bag.  The midline enterocutaneous fistula site is pouched with a red rubber catheter attached to suction.  There is yellowish drainage material in the suction canister.  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  Currently this is being managed with a pouch attached to wall suction.  The current output has been 350 cc.  Will continue Octreotide at 100 mcg BID for now.

## 2018-10-09 NOTE — Plan of Care (Signed)
Drain output monitored. No drains complications. The patient was up in the chair. PRN nausea med given. Uses the bedside commode. TPN and lipids provided.  Problem: Education: Goal: Knowledge of General Education information will improve Description Including pain rating scale, medication(s)/side effects and non-pharmacologic comfort measures Outcome: Progressing   Problem: Health Behavior/Discharge Planning: Goal: Ability to manage health-related needs will improve Outcome: Progressing   Problem: Clinical Measurements: Goal: Ability to maintain clinical measurements within normal limits will improve Outcome: Progressing Goal: Will remain free from infection Outcome: Progressing Goal: Diagnostic test results will improve Outcome: Progressing Goal: Respiratory complications will improve Outcome: Progressing Goal: Cardiovascular complication will be avoided Outcome: Progressing   Problem: Activity: Goal: Risk for activity intolerance will decrease Outcome: Progressing   Problem: Nutrition: Goal: Adequate nutrition will be maintained Outcome: Progressing   Problem: Coping: Goal: Level of anxiety will decrease Outcome: Progressing   Problem: Elimination: Goal: Will not experience complications related to bowel motility Outcome: Progressing Goal: Will not experience complications related to urinary retention Outcome: Progressing   Problem: Pain Managment: Goal: General experience of comfort will improve Outcome: Progressing   Problem: Safety: Goal: Ability to remain free from injury will improve Outcome: Progressing   Problem: Skin Integrity: Goal: Risk for impaired skin integrity will decrease Outcome: Progressing

## 2018-10-09 NOTE — Consult Note (Signed)
PHARMACY - ADULT TOTAL PARENTERAL NUTRITION CONSULT NOTE   Pharmacy Consult for TPN management Indication: malnutrition  Patient Measurements: Height: 5\' 3"  (160 cm) Weight: 166 lb 7.2 oz (75.5 kg) IBW/kg (Calculated) : 52.4 TPN AdjBW (KG): 77 Body mass index is 29.48 kg/m.  Assessment: 72 y/o female with h/o transverse colectomy for unresectable colon polyp on 08/23/18 recent admission for incisional abscess and wound dehiscence requiring exploratory laparotomy incisional abscess washout, primary closure of anastomosis leak, placement of phasix mesh 11/17; pt now admitted for abdominal pain, stool in JP drain and wound dehiscence   This patient had a recent admission and received TPN from 11/22-12/3 and tolerated TPN well. Pt reports that her appetite and oral intake have been severely decreased over the past week. She has lost ~15-20lb(9%) wt loss over the past 3 weeks. PICC line placed this morning. Suspect pt at high refeeding risk; will monitor electrolytes closely.   GI:  Endo: sensitive SSI q6h - Insulin requirements in the past 24 hours: 3 Lytes: WNL Renal: SCr 0.66 ID: [id number]  TPN Access: 12/18 TPN start date: 12/18 Nutritional Goals (per RD recommendation on 12/18): Regimen @ goal rate provides 1894kcal/day, 100g/day protein, 2265ml volume   Goal TPN rate is 83 ml/hr (provides 90% of their needs)  Current Nutrition: NPO except sips with meds + TPN  Plan:  Continue Clinimix E 5/15 TPN at goal rate of 83 mL/hr + 20% lipids at 38mL/hr (no changes today per dietitian)  Additional Electrolytes in TPN: none Add MVI, trace elements, vitC 500 g and thiamine 100mg /day for 3 days- 10/07/18 was Day 3 of Thiamine. Continue q6h sensitive SSI and adjust as needed  Monitor TPN labs P, K and Mg at least daily for 3 days then Mon,Thur if WNL F/U electrolytes on Mon 12/23.  Thom Ollinger A, PharmD 10/09/2018,10:59 AM

## 2018-10-10 LAB — DIFFERENTIAL
Abs Immature Granulocytes: 0.03 10*3/uL (ref 0.00–0.07)
BASOS PCT: 0 %
Basophils Absolute: 0 10*3/uL (ref 0.0–0.1)
Eosinophils Absolute: 0 10*3/uL (ref 0.0–0.5)
Eosinophils Relative: 1 %
Immature Granulocytes: 0 %
Lymphocytes Relative: 30 %
Lymphs Abs: 2.1 10*3/uL (ref 0.7–4.0)
MONO ABS: 0.8 10*3/uL (ref 0.1–1.0)
Monocytes Relative: 11 %
Neutro Abs: 3.9 10*3/uL (ref 1.7–7.7)
Neutrophils Relative %: 58 %

## 2018-10-10 LAB — CBC
HCT: 31.8 % — ABNORMAL LOW (ref 36.0–46.0)
HEMOGLOBIN: 10 g/dL — AB (ref 12.0–15.0)
MCH: 30 pg (ref 26.0–34.0)
MCHC: 31.4 g/dL (ref 30.0–36.0)
MCV: 95.5 fL (ref 80.0–100.0)
Platelets: 267 10*3/uL (ref 150–400)
RBC: 3.33 MIL/uL — ABNORMAL LOW (ref 3.87–5.11)
RDW: 13.2 % (ref 11.5–15.5)
WBC: 6.8 10*3/uL (ref 4.0–10.5)
nRBC: 0 % (ref 0.0–0.2)

## 2018-10-10 LAB — COMPREHENSIVE METABOLIC PANEL
ALT: 32 U/L (ref 0–44)
AST: 37 U/L (ref 15–41)
Albumin: 2.7 g/dL — ABNORMAL LOW (ref 3.5–5.0)
Alkaline Phosphatase: 66 U/L (ref 38–126)
Anion gap: 6 (ref 5–15)
BUN: 32 mg/dL — ABNORMAL HIGH (ref 8–23)
CO2: 26 mmol/L (ref 22–32)
Calcium: 8.4 mg/dL — ABNORMAL LOW (ref 8.9–10.3)
Chloride: 105 mmol/L (ref 98–111)
Creatinine, Ser: 0.64 mg/dL (ref 0.44–1.00)
GFR calc Af Amer: >60 mL/min (ref >60)
GFR calc non Af Amer: >60 mL/min (ref >60)
Glucose, Bld: 98 mg/dL (ref 70–99)
Potassium: 4 mmol/L (ref 3.5–5.1)
Sodium: 137 mmol/L (ref 135–145)
TOTAL PROTEIN: 5.9 g/dL — AB (ref 6.5–8.1)
Total Bilirubin: 0.4 mg/dL (ref 0.3–1.2)

## 2018-10-10 LAB — GLUCOSE, CAPILLARY
Glucose-Capillary: 110 mg/dL — ABNORMAL HIGH (ref 70–99)
Glucose-Capillary: 119 mg/dL — ABNORMAL HIGH (ref 70–99)
Glucose-Capillary: 125 mg/dL — ABNORMAL HIGH (ref 70–99)
Glucose-Capillary: 131 mg/dL — ABNORMAL HIGH (ref 70–99)
Glucose-Capillary: 145 mg/dL — ABNORMAL HIGH (ref 70–99)

## 2018-10-10 LAB — PHOSPHORUS: Phosphorus: 3.8 mg/dL (ref 2.5–4.6)

## 2018-10-10 LAB — PREALBUMIN: Prealbumin: 27.3 mg/dL (ref 18–38)

## 2018-10-10 LAB — TRIGLYCERIDES: Triglycerides: 145 mg/dL (ref <150)

## 2018-10-10 LAB — MAGNESIUM: Magnesium: 1.9 mg/dL (ref 1.7–2.4)

## 2018-10-10 MED ORDER — TRACE MINERALS CR-CU-MN-SE-ZN 10-1000-500-60 MCG/ML IV SOLN
INTRAVENOUS | Status: AC
  Administered 2018-10-10 – 2018-10-11 (×2): via INTRAVENOUS
  Filled 2018-10-10: qty 1992

## 2018-10-10 MED ORDER — FAT EMULSION PLANT BASED 20 % IV EMUL
240.0000 mL | INTRAVENOUS | Status: AC
  Administered 2018-10-10: 240 mL via INTRAVENOUS
  Filled 2018-10-10: qty 240

## 2018-10-10 MED ORDER — LOPERAMIDE HCL 2 MG PO CAPS
4.0000 mg | ORAL_CAPSULE | Freq: Three times a day (TID) | ORAL | Status: DC
  Administered 2018-10-10 – 2018-10-21 (×33): 4 mg via ORAL
  Filled 2018-10-10 (×33): qty 2

## 2018-10-10 NOTE — Care Management Important Message (Signed)
Patient aware of Medicare IM right.  Paper copy not left upon patient's request.

## 2018-10-10 NOTE — Progress Notes (Signed)
PT Cancellation Note  Patient Details Name: Cassie Carlson MRN: [id number] DOB: August 22, 1946   Cancelled Treatment:    Reason Eval/Treat Not Completed: Fatigue/lethargy limiting ability to participate;Pain limiting ability to participate. Treatment attempted; pt reports pain in abdomen and fatigue and wishes to defer treatment until a later date. Pt notes she has been getting up to the chair for brief periods and is able to transfer to [facility]. Re attempt at a later date as the schedule allows.    Larae Grooms, PTA 10/10/2018, 4:44 PM

## 2018-10-10 NOTE — Clinical Social Work Note (Signed)
CSW received message that patient's daughter is considering having patient return home with hired caregivers. Shela Leff MSW,LCSW [phone]

## 2018-10-10 NOTE — Clinical Social Work Note (Addendum)
Patient has had one facility state that they could take her with TPN, and this facility was [facility] in [place]. CSW spoke with Narda Rutherford, the admission's coordinator there, and she stated that she no longer has any female beds available but to check with her each day to see if they have any openings. CSW spoke with patient about this as a possibility and patient states "I am not going back to rehab from this hospital. Rehab facilities cannot manage the wound needs on my abdomen and it is because of rehab that I am back in the hospital. I have requested to transfer to another hospital."  [facility] MSW,LCSW [phone]

## 2018-10-10 NOTE — Consult Note (Signed)
Cassie Carlson wound follow up Patient receiving care in [facility].  No family present. 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  At the time of my visit the Eakin pouch is intact, as is the ileostomy pouch.  I changed the alginate to the drain insertion site and the associated order to cover with a dry gauze.  The Eakin pouch change instructions have been modified to reflect using the tail of the pouch as the area to insert the Red Robin catheter. I spoke with Dr. Lysle Pearl about the wounds after seeing the patient.  Val Riles, RN, MSN, CWOCN, CNS-BC, pager [phone]

## 2018-10-10 NOTE — Progress Notes (Signed)
MD messaged about. 208/[place]- octreotide give. as soon as she got it she started to get nauseous and had about 20cc of clear emesis/saliva. Zofran given right behind and it has control for now.

## 2018-10-10 NOTE — Progress Notes (Signed)
Subjective:  CC:  Cassie Carlson is a 72 y.o. female  Hospital stay day 6,    08/22/18  lap converted to open transverse colectomy for unresectable colon polyp.  09/03/18 exlap, wound washout, Phasix mesh placement over dehiscence from above.   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  Has been intact for few days prior. Complains of nausea since increasing octreotide dose.   ROS:  A 5 point review of systems was performed and pertinent positives and negatives noted in HPI.   Objective:      Temp:  [98.2 F (36.8 C)-98.6 F (37 C)] 98.3 F (36.8 C) (12/23 0510) Pulse Rate:  [68-80] 70 (12/23 0510) Resp:  [16-20] 20 (12/23 0510) BP: (115-130)/(59-70) 126/59 (12/23 0510) SpO2:  [97 %-100 %] 100 % (12/23 0510) Weight:  [75.6 kg] 75.6 kg (12/23 0510)     Height: 5\' 3"  (160 cm) Weight: 75.6 kg BMI (Calculated): 29.53   Intake/Output this shift:   Intake/Output Summary (Last 24 hours) at 10/10/2018 1011 Last data filed at 10/10/2018 0700 Gross per 24 hour  Intake 1944.52 ml  Output 1425 ml  Net 519.52 ml   546ml/24hr of fistula output  Constitutional :  alert, cooperative, appears stated age and no distress  Respiratory:  clear to auscultation bilaterally  Cardiovascular:  regular rate and rhythm  Gastrointestinal: soft, non-tender; bowel sounds normal; no masses,  no organomegaly. Midline wound covered in Eakin pouch and ileostomy pouch intact.  Midline wound has thick granulation tissue wound around the edges that is slowly closing in space.  The open space measures 6cm x 4 cm x 3cm deep.  The measurments total 8cm x 5cm x 3cm if you include the granluation tissue and measure from  regular skin edge to skin edge.  Output is still bilious, draining well through foley catheter.  The former blake drain site is closing well, with granulation tissue noted under the alginate dressing.  Ileostomy remains patent with scant output, mucosa pink.   Skin: Cool and moist.   Psychiatric: Normal affect, non-agitated, not confused       LABS:  CMP Latest Ref Rng & Units 10/10/2018 10/09/2018 10/08/2018  Glucose 70 - 99 mg/dL 98 135(H) 133(H)  BUN 8 - 23 mg/dL 32(H) 28(H) 28(H)  Creatinine 0.44 - 1.00 mg/dL 0.64 0.66 0.78  Sodium 135 - 145 mmol/L 137 138 138  Potassium 3.5 - 5.1 mmol/L 4.0 4.0 3.9  Chloride 98 - 111 mmol/L 105 107 108  CO2 22 - 32 mmol/L 26 25 25   Calcium 8.9 - 10.3 mg/dL 8.4(L) 8.5(L) 8.4(L)  Total Protein 6.5 - 8.1 g/dL 5.9(L) - -  Total Bilirubin 0.3 - 1.2 mg/dL 0.4 - -  Alkaline Phos 38 - 126 U/L 66 - -  AST 15 - 41 U/L 37 - -  ALT 0 - 44 U/L 32 - -   CBC Latest Ref Rng & Units 10/10/2018 10/09/2018 10/08/2018  WBC 4.0 - 10.5 K/uL 6.8 5.9 5.6  Hemoglobin 12.0 - 15.0 g/dL 10.0(L) 10.3(L) 10.1(L)  Hematocrit 36.0 - 46.0 % 31.8(L) 32.4(L) 31.6(L)  Platelets 150 - 400 K/uL 267 276 295    RADS: n/a Assessment:    08/22/18  lap converted to open transverse colectomy for unresectable colon polyp.  09/03/18 exlap, wound washout, Phasix mesh placement over dehiscence from above.   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  Will try to maintain current dosage incase it is having some effect on decreasing fistula output.  Will increase loperamide dose in the meantime to attempt to decrease even more.  Continue   NPO, TPN  and local wound care. Eakin pouch seems to be best option, and wound seems to be continue to decrease in size, patient remain otherwise stable.  Discussed with wound care nurse.   Will keep inhouse until we can further decrease fistula ouput

## 2018-10-10 NOTE — Consult Note (Signed)
PHARMACY - ADULT TOTAL PARENTERAL NUTRITION CONSULT NOTE   Pharmacy Consult for TPN management Indication: malnutrition  Patient Measurements: Height: 5\' 3"  (160 cm) Weight: 166 lb 10.7 oz (75.6 kg) IBW/kg (Calculated) : 52.4 TPN AdjBW (KG): 77 Body mass index is 29.52 kg/m.  Assessment: 72 y/o female with h/o transverse colectomy for unresectable colon polyp on 08/23/18 recent admission for incisional abscess and wound dehiscence requiring exploratory laparotomy incisional abscess washout, primary closure of anastomosis leak, placement of phasix mesh 11/17; pt now admitted for abdominal pain, stool in JP drain and wound dehiscence   This patient had a recent admission and received TPN from 11/22-12/3 and tolerated TPN well. Pt reports that her appetite and oral intake have been severely decreased over the past week. She has lost ~15-20lb(9%) wt loss over the past 3 weeks.  Suspect pt at high refeeding risk; will monitor electrolytes closely.   GI:  Endo: sensitive SSI q6h - Insulin requirements in the past 24 hours: 3 Lytes: WNL Renal: SCr 0.66 ID: [id number]  TPN Access: 12/18 TPN start date: 12/18 Nutritional Goals (per RD recommendation on 12/18): Regimen @ goal rate provides 1894kcal/day, 100g/day protein, 2291ml volume   Goal TPN rate is 83 ml/hr (provides 90% of their needs)  Current Nutrition: NPO except sips with meds + TPN  Plan:  Continue Clinimix E 5/15 TPN at goal rate of 83 mL/hr + 20% lipids at 43mL/hr (no changes today per dietitian)  Additional Electrolytes in TPN: none Add MVI, trace elements, vitC 500 g Continue q6h sensitive SSI and adjust as needed  Monitor TPN labs P, K and Mg at least daily for 3 days then Mon,Thur if WNL F/U electrolytes on Mon 12/23.  Lu Duffel, PharmD, BCPS Clinical Pharmacist 10/10/2018 9:21 AM

## 2018-10-11 LAB — GLUCOSE, CAPILLARY
Glucose-Capillary: 105 mg/dL — ABNORMAL HIGH (ref 70–99)
Glucose-Capillary: 132 mg/dL — ABNORMAL HIGH (ref 70–99)
Glucose-Capillary: 153 mg/dL — ABNORMAL HIGH (ref 70–99)
Glucose-Capillary: 180 mg/dL — ABNORMAL HIGH (ref 70–99)

## 2018-10-11 LAB — PHOSPHORUS: Phosphorus: 4.7 mg/dL — ABNORMAL HIGH (ref 2.5–4.6)

## 2018-10-11 LAB — BASIC METABOLIC PANEL
Anion gap: 7 (ref 5–15)
BUN: 30 mg/dL — ABNORMAL HIGH (ref 8–23)
CHLORIDE: 105 mmol/L (ref 98–111)
CO2: 25 mmol/L (ref 22–32)
Calcium: 8.8 mg/dL — ABNORMAL LOW (ref 8.9–10.3)
Creatinine, Ser: 0.74 mg/dL (ref 0.44–1.00)
GFR calc Af Amer: >60 mL/min (ref >60)
GFR calc non Af Amer: >60 mL/min (ref >60)
Glucose, Bld: 83 mg/dL (ref 70–99)
Potassium: 4.6 mmol/L (ref 3.5–5.1)
SODIUM: 137 mmol/L (ref 135–145)

## 2018-10-11 LAB — MAGNESIUM: Magnesium: 2 mg/dL (ref 1.7–2.4)

## 2018-10-11 MED ORDER — FAT EMULSION PLANT BASED 20 % IV EMUL
240.0000 mL | INTRAVENOUS | Status: AC
  Administered 2018-10-11: 240 mL via INTRAVENOUS
  Filled 2018-10-11: qty 240

## 2018-10-11 MED ORDER — TRACE MINERALS CR-CU-MN-SE-ZN 10-1000-500-60 MCG/ML IV SOLN
INTRAVENOUS | Status: AC
  Administered 2018-10-11: 18:00:00 via INTRAVENOUS
  Filled 2018-10-11: qty 1992

## 2018-10-11 NOTE — Clinical Social Work Note (Signed)
RN CM followed up with patient's daughter today regarding patient's daughter mentioning to staff that she may decide to have her mother return home with hired caregivers who can do the TPN and wound care. RN CM relayed to CSW that patient was allegedly upset after CSW left her room yesterday and that the daughter was upset with CSW for going to speak with patient. CSW contacted patient's daughter this morning to assist in clarifying CSW discussion with her mother yesterday. CSW explained that much time was spent with her mother yesterday and that CSW provided supportive listening and validation regarding patient's fears surrounding patient's entire experience. CSW explained to daughter that patient was upset with her current situation but was not upset with CSW but was not happy with the current facility offer. CSW informed daughter that CSW was required to speak with patient due to her being alert and oriented X4 and being involved in her own care. Patient's daughter stated she was not concerned about the TPN but with the wound care patient would require. CSW expressed understanding but stated that both had to be considered when attempting to find placement for patient. CSW explained that [facility] had been contacted and that they were able to do TPN but only on a short term basis but could not accept patient over the next couple of weeks because of their staffing. CSW explained the difficulties with TPN reimbursement for facilities and daughter wanted to know if she could pay [facility] for the difference in the cost of the TPN. CSW explained that this could be brought up to [facility] but that it would be a remote possibility. Patient's daughter does not understand that care from a facility cannot be guaranteed by CSW. Patient's daughter states if it was the "right facility" then they would consider SNF. Unfortunately no other bed offers at this time and she has declined [facility] in [place]. Shela Leff MSW,LCSW [phone]

## 2018-10-11 NOTE — Progress Notes (Signed)
[facility] Hospital Day(s): 7.   Post op day(s):  Marland Kitchen   Interval History: Patient seen and examined, no acute events or new complaints overnight. Patient reports feeling a little bit better, denies nausea or vomiting.  Vital signs in last 24 hours: [min-max] current  Temp:  [98 F (36.7 C)-98.2 F (36.8 C)] 98.2 F (36.8 C) (12/24 0612) Pulse Rate:  [63-73] 63 (12/24 0612) Resp:  [16-20] 18 (12/24 0612) BP: (114-125)/(57-61) 123/57 (12/24 0612) SpO2:  [98 %-99 %] 98 % (12/24 0612)     Height: 5\' 3"  (160 cm) Weight: 75.6 kg BMI (Calculated): 29.53   Drain output: charted as 150 mL (serous/biliouis)  Physical Exam:  Constitutional: alert, cooperative and no distress  Respiratory: breathing non-labored at rest  Cardiovascular: regular rate and sinus rhythm  Gastrointestinal: soft, non-tender, and non-distended. The midline wound open with two bowel openings. Granulation tissue, no ischemic or necrotic tissue, no purulent secretions. Skin healed.   Labs:  CBC Latest Ref Rng & Units 10/10/2018 10/09/2018 10/08/2018  WBC 4.0 - 10.5 K/uL 6.8 5.9 5.6  Hemoglobin 12.0 - 15.0 g/dL 10.0(L) 10.3(L) 10.1(L)  Hematocrit 36.0 - 46.0 % 31.8(L) 32.4(L) 31.6(L)  Platelets 150 - 400 K/uL 267 276 295   CMP Latest Ref Rng & Units 10/10/2018 10/09/2018 10/08/2018  Glucose 70 - 99 mg/dL 98 135(H) 133(H)  BUN 8 - 23 mg/dL 32(H) 28(H) 28(H)  Creatinine 0.44 - 1.00 mg/dL 0.64 0.66 0.78  Sodium 135 - 145 mmol/L 137 138 138  Potassium 3.5 - 5.1 mmol/L 4.0 4.0 3.9  Chloride 98 - 111 mmol/L 105 107 108  CO2 22 - 32 mmol/L 26 25 25   Calcium 8.9 - 10.3 mg/dL 8.4(L) 8.5(L) 8.4(L)  Total Protein 6.5 - 8.1 g/dL 5.9(L) - -  Total Bilirubin 0.3 - 1.2 mg/dL 0.4 - -  Alkaline Phos 38 - 126 U/L 66 - -  AST 15 - 41 U/L 37 - -  ALT 0 - 44 U/L 32 - -    Imaging studies: No new pertinent imaging studies   Assessment/Plan:  72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 Patient currently on  the healing phase, no sepsis or sign of infection. Output still need to be controlled. Yesterday it was charted as 150 mL in 24 which is great but that has to be confirmed and reproduced in subsequent days. Will continue same treatment with Octreotide, TPN, imodium. Continue local care control with drain bag. Patient encouraged to ambulate.   Arnold Long, MD

## 2018-10-11 NOTE — Consult Note (Signed)
[place] Nurse wound consult note Patient receiving care in [facility]. I changed the Eakin fistula pouch, not because it was leaking, but because the patient was very anxious about the suctioning sound from the air leak. There is no skin breakdown beneath the barrier of the Eakin, and the pseudostoma is in the inferior area of the abdominal wound.  A no leak seal was obtained when the Red Robinson catheter was placed to suction.  I also changed the ileostomy pouch, not because there was much in the pouch, but to assure the patient.  The RLQ ileostomy stoma is just slightly above the skin, oval in shape, beefy red and moist.  It measures approximately 1 inch vertically, and 1.5 inches horizontally.  There is no skin breakdown around the stoma.  A barrier ring was used around the stoma and to fill the crease to the patient's left of the stoma (the crease appears to the right when looking at the stoma).  The drain wound continues to heal.  Val Riles, RN, MSN, CWOCN, CNS-BC, pager [phone]

## 2018-10-11 NOTE — Progress Notes (Signed)
Physical Therapy Treatment Patient Details Name: Cassie Carlson MRN: [id number] DOB: Mar 10, 1946 Today's Date: 10/11/2018    History of Present Illness Pt. is a 72 y/o female with h/o transverse colectomy for unresectable colon polyp on 08/23/18 recent admission for incisional abscess and wound dehiscence requiring exploratory laparotomy incisional abscess washout, primary closure of anastomosis leak, placement of phasix mesh 11/17; pt now admitted for abdominal pain, stool in JP drain and wound dehiscence.     PT Comments    Pt was able to transition out of bed with min a x 1 then complete lap around nursing unit with walker and min guard.  She remained in recliner after session.  RN to manage suction tubing for gait.  Pt overall did very well this session with improved mobility.  She reported no dizziness with transitions.  Will leave discharge recommendations at SNF for now as she may need skilled nursing for wound management and medical needs but if they can be managed at home, she may be appropriate for HHPT upon discharge.   Follow Up Recommendations  SNF     Equipment Recommendations    Rolling walker   Recommendations for Other Services       Precautions / Restrictions Precautions Precautions: Fall Precaution Comments: abdominal incision with wound suction, colostomy; Restrictions Weight Bearing Restrictions: No    Mobility  Bed Mobility Overal bed mobility: Needs Assistance   Rolling: Supervision Sidelying to sit: Supervision Supine to sit: Min assist        Transfers Overall transfer level: Needs assistance Equipment used: Rolling walker (2 wheeled) Transfers: Sit to/from Stand Sit to Stand: Min guard         General transfer comment: increased time  Ambulation/Gait   Gait Distance (Feet): 200 Feet Assistive device: Rolling walker (2 wheeled) Gait Pattern/deviations: Step-through pattern Gait velocity: decreased       Stairs              Wheelchair Mobility    Modified Rankin (Stroke Patients Only)       Balance Overall balance assessment: Modified Independent                                          Cognition Arousal/Alertness: Awake/alert Behavior During Therapy: WFL for tasks assessed/performed Overall Cognitive Status: Within Functional Limits for tasks assessed                                        Exercises Other Exercises Other Exercises: supine ankle pumps, SLR and heel slides BLE x 10, Ab/add 2 x 10 BLE - encouraged I hep     General Comments        Pertinent Vitals/Pain Pain Assessment: Faces Faces Pain Scale: Hurts a little bit Pain Location: surgical site Pain Descriptors / Indicators: Guarding;Grimacing Pain Intervention(s): Limited activity within patient's tolerance;Monitored during session    Home Living                      Prior Function            PT Goals (current goals can now be found in the care plan section) Progress towards PT goals: Progressing toward goals    Frequency    Min 2X/week      PT Plan Current  plan remains appropriate    Co-evaluation              AM-PAC PT "6 Clicks" Mobility   Outcome Measure  Help needed turning from your back to your side while in a flat bed without using bedrails?: A Little Help needed moving from lying on your back to sitting on the side of a flat bed without using bedrails?: A Little Help needed moving to and from a bed to a chair (including a wheelchair)?: A Little Help needed standing up from a chair using your arms (e.g., wheelchair or bedside chair)?: A Little Help needed to walk in hospital room?: A Little Help needed climbing 3-5 steps with a railing? : A Little 6 Click Score: 18    End of Session Equipment Utilized During Treatment: Gait belt Activity Tolerance: Patient tolerated treatment well Patient left: in chair;with call bell/phone within reach Nurse  Communication: Other (comment)       Time: 2751-7001 PT Time Calculation (min) (ACUTE ONLY): 20 min  Charges:  $Gait Training: 8-22 mins                     Chesley Noon, PTA 10/11/18, 12:38 PM

## 2018-10-11 NOTE — Care Management Note (Signed)
Case Management Note  Patient Details  Name: Cassie Carlson MRN: [id number] Date of Birth: 1946/09/10   [facility] received call from patient's daughter wanting to discuss information that was provided to the patient yesterday.   Daughter expresses concerns  Of the care that her mother received while at [facility].  Daughter understands that patient has only received 1 bed offer at this time.  Daughter states "if they have lower stars then [facility], how are they supposed to take care of her."  RNCM listen while daughter expressed her concerns.    Daughter states that going to a facility is not completely off the table, however she wants it "be a place that can provide the care she needs".    Daughter inquires about LTACH.  Patient has not had a required 3 night ICU stay to be eligible to to to [facility].  Daughter expresses frustration by the requirements.    Daughter ask "well then what happens when she is ready to leave".  RNCM explained that the options would be to discharge to SNF level of care at the facility that has offered.  Or return home with home health services (patient previously open with [facility]), TPN, and private duty care if indicated (list provided previous admission).  Daughter states "I just don't understand how that's the only options that would be available in a [place], I will write the governor if I need to".   Daughter states that due to the patient not having any family locally, and only have 1 friend that is available for support she doesn't see how it would be possible for the patient to be able to manage her wounds/fistulas along with TPN at home.  Daughter will be arriving from [facility] tomorrow.  Daughter request that when a discharge order is placed for her to directly be called so she can file and appeal of discharge.    Subjective/Objective:                    Action/Plan:   Expected Discharge Date:                  Expected Discharge Plan:      In-House Referral:     Discharge planning Services     Post Acute Care Choice:    Choice offered to:     DME Arranged:    DME Agency:     HH Arranged:    HH Agency:     Status of Service:     If discussed at [employer] of Stay Meetings, dates discussed:    Additional Comments:  Beverly Sessions, RN 10/11/2018, 1:53 PM

## 2018-10-11 NOTE — Consult Note (Signed)
PHARMACY - ADULT TOTAL PARENTERAL NUTRITION CONSULT NOTE   Pharmacy Consult for TPN management Indication: malnutrition  Patient Measurements: Height: 5\' 3"  (160 cm) Weight: 166 lb 10.7 oz (75.6 kg) IBW/kg (Calculated) : 52.4 TPN AdjBW (KG): 77 Body mass index is 29.52 kg/m.  Assessment: 72 y/o female with h/o transverse colectomy for unresectable colon polyp on 08/23/18 recent admission for incisional abscess and wound dehiscence requiring exploratory laparotomy incisional abscess washout, primary closure of anastomosis leak, placement of phasix mesh 11/17; pt now admitted for abdominal pain, stool in JP drain and wound dehiscence   This patient had a recent admission and received TPN from 11/22-12/3 and tolerated TPN well. Pt reports that her appetite and oral intake have been severely decreased over the past week. She has lost ~15-20lb(9%) wt loss over the past 3 weeks.  Suspect pt at high refeeding risk; will monitor electrolytes closely.   GI:  Endo: sensitive SSI q6h - Insulin requirements in the past 24 hours: 5 Lytes: WNL (Phos mildly elevated) Renal: SCr 0.74 ID: WBC wnl  TPN Access: 12/18 TPN start date: 12/18 Nutritional Goals (per RD recommendation on 12/18): Regimen @ goal rate provides 1894kcal/day, 100g/day protein, 2241ml volume   Goal TPN rate is 83 ml/hr (provides 90% of their needs)  Current Nutrition: NPO except sips with meds + TPN  Plan:  Continue Clinimix E 5/15 TPN at goal rate of 83 mL/hr + 20% lipids at 72mL/hr (no changes today per dietitian)  Additional Electrolytes in TPN: none Add MVI, trace elements, vitC 500 g Continue q6h sensitive SSI and adjust as needed  Monitor TPN labs P, K and Mg at least daily for 3 days then Mon,Thur if WNL F/U electrolytes on Wed 12/25.  Cassie Carlson, PharmD, BCPS Clinical Pharmacist 10/11/2018 9:31 AM

## 2018-10-11 NOTE — Progress Notes (Signed)
Occupational Therapy Treatment Patient Details Name: Cassie Carlson MRN: [id number] DOB: Jun 07, 1946 Today's Date: 10/11/2018    History of present illness Pt. is a 72 y/o female with h/o transverse colectomy for unresectable colon polyp on 08/23/18 recent admission for incisional abscess and wound dehiscence requiring exploratory laparotomy incisional abscess washout, primary closure of anastomosis leak, placement of phasix mesh 11/17; pt now admitted for abdominal pain, stool in JP drain and wound dehiscence.    OT comments  Pt seen for OT tx this date. Pt reporting 3-4 abdominal pain. Pt educated in energy conservation strategies and falls prevention strategies to maximize safety and independence with ADL and functional mobility. Pt verbalized understanding. Pt performed transfers with CGA and amb with RW with CGA from recliner>EOB. Pt progressing, d/c remains appropriate. Will continue to address.    Follow Up Recommendations  SNF    Equipment Recommendations  None recommended by OT    Recommendations for Other Services      Precautions / Restrictions Precautions Precautions: Fall Precaution Comments: abdominal incision with wound suction, colostomy; Restrictions Weight Bearing Restrictions: No       Mobility Bed Mobility Overal bed mobility: Needs Assistance Bed Mobility: Sit to Supine Sit to supine: Supervision      Transfers Overall transfer level: Needs assistance Equipment used: Rolling walker (2 wheeled) Transfers: Sit to/from Stand Sit to Stand: Min guard         General transfer comment: increased time    Balance Overall balance assessment: Modified Independent                                         ADL either performed or assessed with clinical judgement   ADL                                               Vision Patient Visual Report: No change from baseline     Perception     Praxis      Cognition  Arousal/Alertness: Awake/alert Behavior During Therapy: WFL for tasks assessed/performed Overall Cognitive Status: Within Functional Limits for tasks assessed                                          Exercises Other Exercises Other Exercises: pt educated energy conservation strategies including activity pacing, home/routines modifications, work simplification, falls prevention, and pet care considerations   Shoulder Instructions       General Comments drain site, colostomy intact beginning/end of session    Pertinent Vitals/ Pain       Pain Assessment: 0-10 Pain Score: 4  Faces Pain Scale: Hurts a little bit Pain Location: surgical site Pain Descriptors / Indicators: Guarding;Grimacing Pain Intervention(s): Limited activity within patient's tolerance;Monitored during session;Premedicated before session;Repositioned  Home Living                                          Prior Functioning/Environment              Frequency  Min 1X/week        Progress Toward  Goals  OT Goals(current goals can now be found in the care plan section)  Progress towards OT goals: Progressing toward goals  Acute Rehab OT Goals Patient Stated Goal: to improve mobility  Plan Discharge plan remains appropriate;Frequency remains appropriate    Co-evaluation                 AM-PAC OT "6 Clicks" Daily Activity     Outcome Measure   Help from another person eating meals?: (NPO) Help from another person taking care of personal grooming?: None Help from another person toileting, which includes using toliet, bedpan, or urinal?: A Little Help from another person bathing (including washing, rinsing, drying)?: A Lot Help from another person to put on and taking off regular upper body clothing?: A Little Help from another person to put on and taking off regular lower body clothing?: A Lot 6 Click Score: 14    End of Session Equipment Utilized During  Treatment: Gait belt  OT Visit Diagnosis: Muscle weakness (generalized) (M62.81)   Activity Tolerance Patient tolerated treatment well   Patient Left in bed;with call bell/phone within reach;with bed alarm set   Nurse Communication          Time: 1255-1316 OT Time Calculation (min): 21 min  Charges: OT General Charges $OT Visit: 1 Visit OT Treatments $Self Care/Home Management : 8-22 mins  Jeni Salles, MPH, MS, OTR/L ascom [phone] 10/11/18, 1:26 PM

## 2018-10-12 LAB — GLUCOSE, CAPILLARY
Glucose-Capillary: 105 mg/dL — ABNORMAL HIGH (ref 70–99)
Glucose-Capillary: 117 mg/dL — ABNORMAL HIGH (ref 70–99)
Glucose-Capillary: 118 mg/dL — ABNORMAL HIGH (ref 70–99)
Glucose-Capillary: 121 mg/dL — ABNORMAL HIGH (ref 70–99)

## 2018-10-12 LAB — MAGNESIUM: Magnesium: 2 mg/dL (ref 1.7–2.4)

## 2018-10-12 LAB — PHOSPHORUS: PHOSPHORUS: 4.3 mg/dL (ref 2.5–4.6)

## 2018-10-12 MED ORDER — TRACE MINERALS CR-CU-MN-SE-ZN 10-1000-500-60 MCG/ML IV SOLN
INTRAVENOUS | Status: AC
  Administered 2018-10-12: 17:00:00 via INTRAVENOUS
  Filled 2018-10-12: qty 1992

## 2018-10-12 MED ORDER — FAT EMULSION PLANT BASED 20 % IV EMUL
240.0000 mL | INTRAVENOUS | Status: AC
  Administered 2018-10-12: 240 mL via INTRAVENOUS
  Filled 2018-10-12: qty 240

## 2018-10-12 MED ORDER — PANTOPRAZOLE SODIUM 40 MG IV SOLR
40.0000 mg | Freq: Two times a day (BID) | INTRAVENOUS | Status: DC
  Administered 2018-10-12 – 2018-10-24 (×25): 40 mg via INTRAVENOUS
  Filled 2018-10-12 (×25): qty 40

## 2018-10-12 NOTE — Progress Notes (Signed)
CC: fistula Subjective: Now has some issue w leaking. Output 425cc AVSS No acute issues. Some pain from irritation .  Objective: Vital signs in last 24 hours: Temp:  [98.2 F (36.8 C)-98.7 F (37.1 C)] 98.3 F (36.8 C) (12/25 0407) Pulse Rate:  [64-67] 65 (12/25 0407) Resp:  [16-20] 20 (12/25 0407) BP: (119-127)/(54-74) 127/74 (12/25 0407) SpO2:  [98 %-100 %] 100 % (12/25 0407) Weight:  [76.6 kg] 76.6 kg (12/25 0407) Last BM Date: 10/10/18  Intake/Output from previous day: 12/24 0701 - 12/25 0700 In: 2328.4 [I.V.:2328.4] Out: 1075 [Urine:650; Drains:425] Intake/Output this shift: Total I/O In: 838.8 [I.V.:838.8] Out: -   Physical exam:  NAD Abd: soft, management system w red rubber in place and gold bile. No peritonitis, no infection. Ostomy RLQ viable. Ext: well perfused and warm  Lab Results: CBC  Recent Labs    10/10/18 0524  WBC 6.8  HGB 10.0*  HCT 31.8*  PLT 267   BMET Recent Labs    10/10/18 0524 10/11/18 0738  NA 137 137  K 4.0 4.6  CL 105 105  CO2 26 25  GLUCOSE 98 83  BUN 32* 30*  CREATININE 0.64 0.74  CALCIUM 8.4* 8.8*   PT/INR No results for input(s): LABPROT, INR in the last 72 hours. ABG No results for input(s): PHART, HCO3 in the last 72 hours.  Invalid input(s): PCO2, PO2  Studies/Results: No results found.  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 , no sepsis or sign of infection but with persistent high output.  Will continue with Octreotide, TPN and imodium. Continue local   Wound care. I increased her PPI to BID. Nutritional labs in am     Caroleen Hamman, MD, [facility]  10/12/2018

## 2018-10-12 NOTE — Consult Note (Signed)
PHARMACY - ADULT TOTAL PARENTERAL NUTRITION CONSULT NOTE   Pharmacy Consult for TPN management Indication: malnutrition  Patient Measurements: Height: 5\' 3"  (160 cm) Weight: 168 lb 12.8 oz (76.6 kg) IBW/kg (Calculated) : 52.4 TPN AdjBW (KG): 77 Body mass index is 29.9 kg/m.  Assessment: 72 y/o female with h/o transverse colectomy for unresectable colon polyp on 08/23/18 recent admission for incisional abscess and wound dehiscence requiring exploratory laparotomy incisional abscess washout, primary closure of anastomosis leak, placement of phasix mesh 11/17; pt now admitted for abdominal pain, stool in JP drain and wound dehiscence   This patient had a recent admission and received TPN from 11/22-12/3 and tolerated TPN well. Pt reports that her appetite and oral intake have been severely decreased over the past week. She has lost ~15-20lb(9%) wt loss over the past 3 weeks.  Suspect pt at high refeeding risk; will monitor electrolytes closely.   GI:  Endo: sensitive SSI q6h - Insulin requirements in the past 24 hours: 2 BG over past 24 hours 83 -153 Lytes: WNL  Renal: SCr 0.74 (12/24) ID: WBC wnl (12/23)  TPN Access: 12/18 TPN start date: 12/18 Nutritional Goals (per RD recommendation on 12/18): Regimen @ goal rate provides 1894kcal/day, 100g/day protein, 2241ml volume   Goal TPN rate is 83 ml/hr (provides 90% of their needs)  Current Nutrition: NPO except sips with meds + TPN  Plan:  Continue Clinimix E 5/15 TPN at goal rate of 83 mL/hr + 20% lipids at 64mL/hr (no changes today per dietitian)  Additional Electrolytes in TPN: none Add MVI, trace elements, vitC 500 g Continue q6h sensitive SSI and adjust as needed  Monitor TPN labs P, K and Mg at least daily for 3 days then Mon,Thur if WNL F/U electrolytes on Thurs 12/26.   Paticia Stack, PharmD Pharmacy Resident  10/12/2018 10:07 AM

## 2018-10-13 LAB — COMPREHENSIVE METABOLIC PANEL
ALT: 41 U/L (ref 0–44)
AST: 49 U/L — ABNORMAL HIGH (ref 15–41)
Albumin: 2.8 g/dL — ABNORMAL LOW (ref 3.5–5.0)
Alkaline Phosphatase: 77 U/L (ref 38–126)
Anion gap: 7 (ref 5–15)
BUN: 33 mg/dL — ABNORMAL HIGH (ref 8–23)
CO2: 26 mmol/L (ref 22–32)
Calcium: 8.6 mg/dL — ABNORMAL LOW (ref 8.9–10.3)
Chloride: 105 mmol/L (ref 98–111)
Creatinine, Ser: 0.72 mg/dL (ref 0.44–1.00)
GFR calc Af Amer: >60 mL/min (ref >60)
Glucose, Bld: 129 mg/dL — ABNORMAL HIGH (ref 70–99)
Potassium: 4.3 mmol/L (ref 3.5–5.1)
Sodium: 138 mmol/L (ref 135–145)
Total Bilirubin: 0.4 mg/dL (ref 0.3–1.2)
Total Protein: 5.8 g/dL — ABNORMAL LOW (ref 6.5–8.1)

## 2018-10-13 LAB — GLUCOSE, CAPILLARY
Glucose-Capillary: 106 mg/dL — ABNORMAL HIGH (ref 70–99)
Glucose-Capillary: 136 mg/dL — ABNORMAL HIGH (ref 70–99)
Glucose-Capillary: 141 mg/dL — ABNORMAL HIGH (ref 70–99)

## 2018-10-13 LAB — URINALYSIS, ROUTINE W REFLEX MICROSCOPIC
BILIRUBIN URINE: NEGATIVE
Glucose, UA: NEGATIVE mg/dL
Hgb urine dipstick: NEGATIVE
KETONES UR: NEGATIVE mg/dL
Nitrite: POSITIVE — AB
PH: 5 (ref 5.0–8.0)
Protein, ur: NEGATIVE mg/dL
Specific Gravity, Urine: 1.017 (ref 1.005–1.030)

## 2018-10-13 LAB — MAGNESIUM: Magnesium: 2 mg/dL (ref 1.7–2.4)

## 2018-10-13 LAB — PHOSPHORUS: PHOSPHORUS: 4.7 mg/dL — AB (ref 2.5–4.6)

## 2018-10-13 MED ORDER — FAT EMULSION PLANT BASED 20 % IV EMUL
240.0000 mL | INTRAVENOUS | Status: AC
  Administered 2018-10-13: 240 mL via INTRAVENOUS
  Filled 2018-10-13: qty 240

## 2018-10-13 MED ORDER — TRACE MINERALS CR-CU-MN-SE-ZN 10-1000-500-60 MCG/ML IV SOLN
INTRAVENOUS | Status: AC
  Administered 2018-10-13: 18:00:00 via INTRAVENOUS
  Filled 2018-10-13: qty 1992

## 2018-10-13 MED ORDER — OPIUM 10 MG/ML (1%) PO TINC
6.0000 mg | Freq: Four times a day (QID) | ORAL | Status: DC
  Administered 2018-10-13 – 2018-10-14 (×5): 6 mg via ORAL
  Filled 2018-10-13 (×5): qty 1

## 2018-10-13 NOTE — Progress Notes (Signed)
OT Cancellation Note  Patient Details Name: Cassie Carlson MRN: [id number] DOB: January 28, 1946   Cancelled Treatment:    Reason Eval/Treat Not Completed: Pain limiting ability to participate;  Patient declined. Treatment attempted. Pt reported being too fatigued from PT session and having too much pain to continue with more therapy on this date. When this OT offered to demonstrate AE while patient observed, pt stated she had seen all AE before and would prefer to have OT return the next day. Will continue to follow POC. Re attempt at a later date as the schedule allows.   Nyaira Hodgens, OTR/L 10/13/18, 2:13 PM

## 2018-10-13 NOTE — Care Management Note (Signed)
Case Management Note  Patient Details  Name: Cassie Carlson MRN: [id number] Date of Birth: 04/20/1946  Subjective/Objective:                 Patient and family have declined the only facility that offered a skilled nursing bed that could manage TPN.  It is reported to CM that patient has requested to go home with home health services and private duty.  CM attempted to initiate conversation with patient and she declined to discuss as she wished for her daughter to be present.  She does state that she wants to go to that hospital [facility] that has the hyperbaric treatment.  Spoke with attending.  If patient opts to go home with home health, there will have to be identified caregivers to perform wound care.  Surgeon says that patient will not discharge home with the Eaken puoch to suction at home.  Suggested that attempt be made to set up current wound care scenario / treatment as it would be set up at home so can determine what wound care needs will be needed, if discontinuing the constant suction will work, and if patient will be able to manage the wound care.  It has been reported that there will be no family members present to assist with care but CM has not been able to confirm.   Action/Plan:  Would anticipate need for hospital bed, round the clock private duty nurse, therapy -PT/OT, TPN referral.  Will provide family with lists of agencies  that perform private duty nursing, Infusion therapy, home health, and equipment.  If daughter does not come back to unit before CM leaves for the day, will leave information in the room for CM to discuss on 12/27   Expected Discharge Date:                  Expected Discharge Plan:     In-House Referral:     Discharge planning Services     Post Acute Care Choice:    Choice offered to:     DME Arranged:    DME Agency:     HH Arranged:    HH Agency:     Status of Service:     If discussed at [employer], dates discussed:     Additional Comments:  Katrina Stack, RN 10/13/2018, 2:52 PM

## 2018-10-13 NOTE — Progress Notes (Signed)
Physical Therapy Treatment Patient Details Name: Cassie Carlson MRN: [id number] DOB: Dec 26, 1945 Today's Date: 10/13/2018    History of Present Illness Pt. is a 72 y/o female with h/o transverse colectomy for unresectable colon polyp on 08/23/18 recent admission for incisional abscess and wound dehiscence requiring exploratory laparotomy incisional abscess washout, primary closure of anastomosis leak, placement of phasix mesh 11/17; pt now admitted for abdominal pain, stool in JP drain and wound dehiscence.     PT Comments    Pt agreeable to PT for up in chair. Reports moderate pain in abdomen. Requires Min guard and assist for lines during all mobility. Pt receives up in chair comfortably and encouraged to remain up in chair for a couple of hours (pt notes only staying up approximately 45 min). Pt did not wish exercises at this time. Encouraged general leg movement for mobility and strength maintenance. Continue PT to progress activity tolerance and strength to improve all functional mobility as able.    Follow Up Recommendations  SNF     Equipment Recommendations  Other (comment)(TBD )    Recommendations for Other Services       Precautions / Restrictions Precautions Precautions: Fall Restrictions Weight Bearing Restrictions: No    Mobility  Bed Mobility Overal bed mobility: Needs Assistance Bed Mobility: Supine to Sit Rolling: Min guard   Supine to sit: Min guard     General bed mobility comments: cues for sequence to R; pt states she has never gotton out on this side before and requires Min guard and assist with lines  Transfers Overall transfer level: Needs assistance Equipment used: None Transfers: Sit to/from Stand Sit to Stand: Min guard Stand pivot transfers: Min guard       General transfer comment: assist for lines, as pt highly focused on this, but does not problem solve. Cues and assurance given that lines untangled and intact.    Ambulation/Gait Ambulation/Gait assistance: Min guard Gait Distance (Feet): 5 Feet Assistive device: None       General Gait Details: Bed to recliner   Stairs             Wheelchair Mobility    Modified Rankin (Stroke Patients Only)       Balance Overall balance assessment: Needs assistance Sitting-balance support: Feet supported Sitting balance-Leahy Scale: Good     Standing balance support: No upper extremity supported Standing balance-Leahy Scale: Fair                              Cognition Arousal/Alertness: Awake/alert Behavior During Therapy: WFL for tasks assessed/performed Overall Cognitive Status: Within Functional Limits for tasks assessed                                        Exercises      General Comments        Pertinent Vitals/Pain Pain Assessment: 0-10 Pain Score: 5  Pain Location: Abdomen Pain Descriptors / Indicators: Aching;Constant;Dull Pain Intervention(s): Repositioned;Limited activity within patient's tolerance    Home Living                      Prior Function            PT Goals (current goals can now be found in the care plan section) Progress towards PT goals: Progressing toward goals    Frequency  Min 2X/week      PT Plan Current plan remains appropriate    Co-evaluation              AM-PAC PT "6 Clicks" Mobility   Outcome Measure  Help needed turning from your back to your side while in a flat bed without using bedrails?: None Help needed moving from lying on your back to sitting on the side of a flat bed without using bedrails?: None Help needed moving to and from a bed to a chair (including a wheelchair)?: A Little Help needed standing up from a chair using your arms (e.g., wheelchair or bedside chair)?: A Little Help needed to walk in hospital room?: A Little Help needed climbing 3-5 steps with a railing? : A Little 6 Click Score: 20    End of  Session Equipment Utilized During Treatment: Gait belt Activity Tolerance: Patient limited by fatigue Patient left: in chair;with call bell/phone within reach;Other (comment)(refuses alarm/room phone (has it disconnected))   PT Visit Diagnosis: Unsteadiness on feet (R26.81);Muscle weakness (generalized) (M62.81);Other abnormalities of gait and mobility (R26.89)     Time: 6948-5462 PT Time Calculation (min) (ACUTE ONLY): 18 min  Charges:  $Gait Training: 8-22 mins                      Larae Grooms, PTA 10/13/2018, 12:58 PM

## 2018-10-13 NOTE — Care Management Note (Signed)
Case Management Note  Patient Details  Name: Cassie Carlson MRN: [id number] Date of Birth: 1946-08-04  Subjective/Objective:                   Met with patient, [name] and attending.  Will plan to discontinued the suction that is draining the fistula contents.  Contents will now drain into Eakin Pouch  (ostomy ) and measured.  Patient and her daughter will be taught how to manage and empty the ostomy pouch. There are very specific ostomy instructions on the wall in patient's room with specific ostomy shape pattern.    Patient and daughter have chosen [facility] for home health as agency provided services to patient prior to her skilled nursing stay.    Daughter Cassie Carlson has not made choice of infusion agencies..  Says patient has used [facility] for in home companion type services in the past.  Cassie Carlson and patient deny need for hospital bed or walker.  Not sure about bedside commode. Provided [employer] and Museum/gallery curator.  Action/Plan:  Notified Cassie with [facility] of need for RN PT OT Aide and SW.  Discussed the need for RN visit same day of discharge. Discussed the TPN and possible need for IVP Octreotide q 8 h.  Requested WOC consult from attending to initiate hands on teaching.  Refer Expected Discharge Date:                  Expected Discharge Plan:  [place]  In-House Referral:     Discharge planning Services  CM Consult  Post Acute Care Choice:  Home Health, Durable Medical Equipment( Infusion ) Choice offered to:  Patient, Adult Children  DME Arranged:  (Has not made decis8ion regarding infusion agency) DME Agency:     HH Arranged:  RN, PT, OT, Nurse's Aide, Social Work, IV Antibiotics HH Agency:  [facility](Agency for TPN has not been decided)  Status of Service:  In process, will continue to follow  If discussed at Long Length of Stay Meetings, dates discussed:    Additional Comments:  Katrina Stack, RN 10/13/2018, 5:41 PM

## 2018-10-13 NOTE — Consult Note (Signed)
[place] Nurse wound consult note Patient receiving care in [facility].  Patient's daughter, Sharyn Lull, in room. This was a check in visit with the patient to determine how her Eakin pouch was holding up.  I understand from talking with her primary RN, Annabella, that the pouch leaked yesterday.  Annabella changed it following the step by step instructions I posted in the patient's room, and it is working beautifully today.  No evidence of leakage from the Eakin pouch or the ileostomy pouch! Val Riles, RN, MSN, CWOCN, CNS-BC, pager [phone]

## 2018-10-13 NOTE — Progress Notes (Addendum)
MD disconnected  fistula drain from wall suction and switched to foley bag this evening. When RN went back an hour later, the Eakin pouch bag started to fill up.The red tube was not staying in place. It was floating and it was not draining. The fluid started to accumulate and seems to leak.  So this nurse re-connect the drain back  to wall suction as per MD's instruction.

## 2018-10-13 NOTE — Clinical Social Work Note (Signed)
No new rehab bed offers. Surgeon spoke with CSW and RN CM this morning and the new goal would be to teach patient and to have patient comfortable with doing her dressing changes or the daughter hire someone to do the dressing changes and TPN in the home setting. Physician and nurse reporting that patient is daughter are now refusing rehab. RN CM to speak with patient and daughter today regarding the feasibility of patient returning home with services. Shela Leff MSW,LCSW [phone]

## 2018-10-13 NOTE — Progress Notes (Signed)
OT Cancellation Note  Patient Details Name: Cassie Carlson MRN: [id number] DOB: 1946/09/26   Cancelled Treatment:    Reason Eval/Treat Not Completed: Medical issues which prohibited therapy. Shortly after arrival, RN came in room to administer IV medication. Upon hearing she would be getting meds, Pt requested to hold therapy until PM due to previous experiences of nausea with medication. Will re-attempt as available.  Vidya Bamford, OTR/L 10/13/18, 11:10 AM

## 2018-10-13 NOTE — Consult Note (Signed)
PHARMACY - ADULT TOTAL PARENTERAL NUTRITION CONSULT NOTE   Pharmacy Consult for TPN management Indication: malnutrition  Patient Measurements: Height: 5\' 3"  (160 cm) Weight: 173 lb 1 oz (78.5 kg) IBW/kg (Calculated) : 52.4 TPN AdjBW (KG): 77 Body mass index is 30.66 kg/m.  Assessment: 72 y/o female with h/o transverse colectomy for unresectable colon polyp on 08/23/18 recent admission for incisional abscess and wound dehiscence requiring exploratory laparotomy incisional abscess washout, primary closure of anastomosis leak, placement of phasix mesh 11/17; pt now admitted for abdominal pain, stool in JP drain and wound dehiscence   This patient had a recent admission and received TPN from 11/22-12/3 and tolerated TPN well. Pt reports that her appetite and oral intake have been severely decreased over the past week. She has lost ~15-20lb(9%) wt loss over the past 3 weeks.  Suspect pt at high refeeding risk; will monitor electrolytes closely.   GI:  Endo: sensitive SSI q6h - Insulin requirements in the past 24 hours: 2 BG over past 24 hours 83 -153 Lytes: WNL  Renal: SCr 0.74 (12/24) ID: WBC wnl (12/23)  TPN Access: 12/18 TPN start date: 12/18 Nutritional Goals (per RD recommendation on 12/18): Regimen @ goal rate provides 1894kcal/day, 100g/day protein, 2254ml volume   Goal TPN rate is 83 ml/hr (provides 90% of their needs)  Current Nutrition: NPO except sips with meds + TPN  Plan:  Continue Clinimix E 5/15 TPN at goal rate of 83 mL/hr + 20% lipids at 68mL/hr (no changes today per dietitian)  Additional Electrolytes in TPN: none Add MVI, trace elements, vitC 500 g Continue q6h sensitive SSI and adjust as needed  Monitor TPN labs P, K and Mg at least daily for 3 days then Mon,Thur if WNL F/U electrolytes on Fri 12/27.   Lu Duffel, PharmD, BCPS Clinical Pharmacist 10/13/2018 9:29 AM

## 2018-10-13 NOTE — Progress Notes (Signed)
Nutrition Follow Up Note   DOCUMENTATION CODES:   Severe malnutrition in context of acute illness/injury  INTERVENTION:   Continue Clinimix 5/15 with electrolytes at goal rate of 75m/hr   Continue 20% lipids _0 /hr x 12 hrs/day   Regimen @ goal rate provides 1894kcal/day, 100g/day protein, 22371mvolume    Cotninue MVI daily and trace elements daily    Continue vitamin C 50077maily    Pt at high refeeding risk; recommend check P, K, and Mg daily for 3 days after TPN iniatition.    Daily weights   NUTRITION DIAGNOSIS:   Severe Malnutrition related to acute illness(abdominal wound dehiscence and fistula ) as evidenced by 9 percent weight loss in 3 weeks, severe fat depletion in orbital region, moderate muscle depletions in BLE, energy intake < or equal to 50% for > or equal to 5 days.  GOAL:   Patient will meet greater than or equal to 90% of their needs  -met with TPN  MONITOR:   Labs, Weight trends, Skin, I & O's, Other (Comment)(TPN)  ASSESSMENT:   [age]o female with h/o transverse colectomy for unresectable colon polyp on 08/23/18 recent admission for incisional abscess and wound dehiscence requiring exploratory laparotomy incisional abscess washout, primary closure of anastomosis leak, placement of phasix mesh 11/17; pt now admitted with entero-atmospheric fistula   Pt tolerating TPN at goal rate. Patient would likely benefit from TPN after discharge to support wound healing. Refeed labs stable. Per chart, pt's weight up ~9lbs since admit; RD will continue to monitor. UOP 1.2ml69m/hr (2200ml32m4 hrs). 450ml 53mut from fistula x 24 hrs. Total I & O's +614ml. 37mdications reviewed and include: lovenox, insulin, synthroid, imodium, octreotide, protonix  Labs reviewed: K 4.3 wnl, P 4.7(H), Mg 2.0 wnl Prealbumin 27.3- 12/23 Triglycerides 145- 12/23 cbgs- 141, 136 x 24 hrs  Diet Order:   Diet Order            Diet NPO time specified Except for: Sips with Meds   Diet effective now             EDUCATION NEEDS:   Education needs have been addressed  Skin:  Skin Assessment: Reviewed RN Assessment(ecchymosis, incision abdomen )  Last BM:  12/26- type 7 via ostomy   Height:   Ht Readings from Last 1 Encounters:  10/04/18 5' 3" (1.6 m)    Weight:   Wt Readings from Last 1 Encounters:  10/13/18 78.5 kg    Ideal Body Weight:  52.3 kg  BMI:  Body mass index is 30.66 kg/m.  Estimated Nutritional Needs:   Kcal:  1700-1900kcal/day   Protein:  84-94g/day   Fluid:  >1.4L/day   Mitra Duling CKoleen Distance, LDN Pager #- [phone]#- [phone] Pager: [phone]

## 2018-10-14 LAB — GLUCOSE, CAPILLARY
Glucose-Capillary: 111 mg/dL — ABNORMAL HIGH (ref 70–99)
Glucose-Capillary: 116 mg/dL — ABNORMAL HIGH (ref 70–99)
Glucose-Capillary: 118 mg/dL — ABNORMAL HIGH (ref 70–99)
Glucose-Capillary: 137 mg/dL — ABNORMAL HIGH (ref 70–99)

## 2018-10-14 MED ORDER — TRACE MINERALS CR-CU-MN-SE-ZN 10-1000-500-60 MCG/ML IV SOLN
INTRAVENOUS | Status: AC
  Administered 2018-10-14: 19:00:00 via INTRAVENOUS
  Filled 2018-10-14: qty 1992

## 2018-10-14 MED ORDER — OCTREOTIDE LOAD VIA INFUSION: 100.0000 ug | Freq: Every day | INTRAVENOUS | Status: DC

## 2018-10-14 MED ORDER — FAT EMULSION PLANT BASED 20 % IV EMUL
240.0000 mL | INTRAVENOUS | Status: AC
  Administered 2018-10-14: 240 mL via INTRAVENOUS
  Filled 2018-10-14: qty 240

## 2018-10-14 MED ORDER — OCTREOTIDE ACETATE 100 MCG/ML IJ SOLN
100.0000 ug | Freq: Every day | INTRAMUSCULAR | Status: DC
  Administered 2018-10-14 – 2018-10-16 (×3): 100 ug via INTRAVENOUS
  Filled 2018-10-14 (×5): qty 1

## 2018-10-14 MED ORDER — CHOLESTYRAMINE 4 G PO PACK
4.0000 g | PACK | Freq: Three times a day (TID) | ORAL | Status: DC
  Administered 2018-10-14 – 2018-10-24 (×29): 4 g via ORAL
  Filled 2018-10-14 (×32): qty 1

## 2018-10-14 MED ORDER — LEVOFLOXACIN IN D5W 250 MG/50ML IV SOLN
250.0000 mg | INTRAVENOUS | Status: AC
  Administered 2018-10-14 – 2018-10-16 (×3): 250 mg via INTRAVENOUS
  Filled 2018-10-14 (×3): qty 50

## 2018-10-14 NOTE — Care Management Important Message (Signed)
Patient aware of Medicare IM right.  Paper copy not left upon patient's request.

## 2018-10-14 NOTE — Progress Notes (Signed)
Physical Therapy Treatment Patient Details Name: Cassie Carlson MRN: [id number] DOB: 13-Jun-1946 Today's Date: 10/14/2018    History of Present Illness Pt. is a 72 y/o female with h/o transverse colectomy for unresectable colon polyp on 08/23/18 recent admission for incisional abscess and wound dehiscence requiring exploratory laparotomy incisional abscess washout, primary closure of anastomosis leak, placement of phasix mesh 11/17; pt now admitted for abdominal pain, stool in JP drain and wound dehiscence.     PT Comments    Patient alert, up in chair with visitor at bedside, agreeable to PT. Spoke with nursing prior to session, verbalized that the RN did not want to remove pt from wall suction at this time, mobility limited due to this. Pt able to participate fully and with good quality of movements with exercises today. Most challenged by static marching without UE support, fatigued and mildly SOB at end of exercise. Overall pt demonstrated transfers and ability to perform exercises with supervision, minimal verbal cues for proper technique. The patient would benefit from further skilled PT to continue to progress towards goals and to maximize independence, mobility, and safety. Current recommendation appropriate for safety concerns/lack of caregiver support.     Follow Up Recommendations  SNF     Equipment Recommendations  Other (comment)(TBD)    Recommendations for Other Services       Precautions / Restrictions Precautions Precautions: Fall Precaution Comments: abdominal incision with wound suction, colostomy; Restrictions Weight Bearing Restrictions: No    Mobility  Bed Mobility               General bed mobility comments: deferred up in chair  Transfers Overall transfer level: Needs assistance   Transfers: Sit to/from Stand Sit to Stand: Supervision         General transfer comment: Pt and dtr with concerns re: abdominal suction catheter placement, nurse  called and asked to check by OT. Mobility limited at this time d/t pt concerns.  Ambulation/Gait             General Gait Details: deferred, RN did not want to remove pt from wall suction   Stairs             Wheelchair Mobility    Modified Rankin (Stroke Patients Only)       Balance Overall balance assessment: Needs assistance Sitting-balance support: Feet supported Sitting balance-Leahy Scale: Good Sitting balance - Comments: Pt and dtr with concerns re: abdominal suction catheter placement, nurse called and asked to check by OT. Mobility limited at this time d/t pt concerns.   Standing balance support: No upper extremity supported Standing balance-Leahy Scale: Fair Standing balance comment: Pt and dtr with concerns re: abdominal suction catheter placement, nurse called and asked to check by OT. Mobility limited at this time d/t pt concerns.                            Cognition Arousal/Alertness: Awake/alert Behavior During Therapy: WFL for tasks assessed/performed Overall Cognitive Status: Within Functional Limits for tasks assessed                                        Exercises General Exercises - Lower Extremity Ankle Circles/Pumps: Strengthening;Both;20 reps;AROM;Seated Quad Sets: AROM;Strengthening;Both;20 reps;Seated Gluteal Sets: AROM;Strengthening;Both;20 reps;Seated Long Arc Quad: Strengthening;20 reps;Seated;Both Hip ABduction/ADduction: Strengthening;Both;20 reps Straight Leg Raises: Strengthening;Both;20 reps Hip Flexion/Marching: Standing;Other (  comment)(2 x for 30sec each, no UE support, close supervision) Other Exercises Other Exercises: Increased time spent on pt and family education re: in bed and seated exercise, importance of mobility for healing and prevention of further decline in strength and function. OT provided visual demonstration of appropriate in bed and chair UE therex with emphasis on monitoring how  her wound and surrounding area feel with all movement and activity.  Other Exercises: pt educated energy conservation strategies, potential home/routine modifications, and falls prevention strategies d/t current lines/tubes with pt and dtr verbalizing understanding of fall prevention.     General Comments General comments (skin integrity, edema, etc.): drain site and suction intact and functioning at end of session      Pertinent Vitals/Pain Pain Assessment: No/denies pain Pain Score: 4  Pain Location: Abdomen Pain Descriptors / Indicators: Aching;Constant;Dull Pain Intervention(s): Limited activity within patient's tolerance    Home Living                      Prior Function            PT Goals (current goals can now be found in the care plan section) Acute Rehab PT Goals Patient Stated Goal: to improve mobility Progress towards PT goals: Progressing toward goals    Frequency    Min 2X/week      PT Plan Current plan remains appropriate    Co-evaluation              AM-PAC PT "6 Clicks" Mobility   Outcome Measure  Help needed turning from your back to your side while in a flat bed without using bedrails?: None Help needed moving from lying on your back to sitting on the side of a flat bed without using bedrails?: None Help needed moving to and from a bed to a chair (including a wheelchair)?: A Little Help needed standing up from a chair using your arms (e.g., wheelchair or bedside chair)?: A Little Help needed to walk in hospital room?: A Little Help needed climbing 3-5 steps with a railing? : A Little 6 Click Score: 20    End of Session Equipment Utilized During Treatment: Gait belt Activity Tolerance: Patient limited by fatigue Patient left: with chair alarm set;in chair;with call bell/phone within reach Nurse Communication: Other (comment)(requesting to use commode) PT Visit Diagnosis: Unsteadiness on feet (R26.81);Muscle weakness (generalized)  (M62.81);Other abnormalities of gait and mobility (R26.89)     Time: 1351-1416 PT Time Calculation (min) (ACUTE ONLY): 25 min  Charges:  $Therapeutic Exercise: 23-37 mins                    Lieutenant Diego PT, DPT 3:07 PM,10/14/18 [phone]

## 2018-10-14 NOTE — Progress Notes (Signed)
Occupational Therapy Treatment Patient Details Name: Cassie Carlson MRN: [id number] DOB: Aug 18, 1946 Today's Date: 10/14/2018    History of present illness Pt. is a 72 y/o female with h/o transverse colectomy for unresectable colon polyp on 08/23/18 recent admission for incisional abscess and wound dehiscence requiring exploratory laparotomy incisional abscess washout, primary closure of anastomosis leak, placement of phasix mesh 11/17; pt now admitted for abdominal pain, stool in JP drain and wound dehiscence.    OT comments  Pt OT participation limited this date d/t pt and dtr's concerns re: abdominal wound suction catheter placement. RN called, but did not present prior to end of OT tx so time allotted for education and addressing questions/concerns r/t performance of ADLs and mobility with current lines/tubes as well as modifications for LB dressing and other LB ADLs given current discomfort with bending over. Education also provided re: fall prevention considerations, energy conservation, and appropriate UE exercise that can be safely performed in bed or chair to reduce risk of muscle atrophy secondary to prolonged hospitalization. Pt and dtr were both very receptive to education, asked appropriate questions, and verbalized understanding of education provided. D/c recommendations appear to remain appropriate at this time for optimal pt safety.    Follow Up Recommendations  SNF    Equipment Recommendations  None recommended by OT    Recommendations for Other Services      Precautions / Restrictions Precautions Precautions: Fall Precaution Comments: abdominal incision with wound suction, colostomy; Restrictions Weight Bearing Restrictions: No       Mobility Bed Mobility               General bed mobility comments: Pt and dtr with concerns re: abdominal suction catheter placement, nurse called and asked to check by OT. Mobility limited at this time d/t pt concerns.    Transfers                 General transfer comment: Pt and dtr with concerns re: abdominal suction catheter placement, nurse called and asked to check by OT. Mobility limited at this time d/t pt concerns.    Balance       Sitting balance - Comments: Pt and dtr with concerns re: abdominal suction catheter placement, nurse called and asked to check by OT. Mobility limited at this time d/t pt concerns.       Standing balance comment: Pt and dtr with concerns re: abdominal suction catheter placement, nurse called and asked to check by OT. Mobility limited at this time d/t pt concerns.                           ADL either performed or assessed with clinical judgement   ADL Overall ADL's : Needs assistance/impaired Eating/Feeding: NPO                                     General ADL Comments: Pt and dtr with concerns re: abdominal suction catheter so mobilization and ADL performance limited this date, RN called and notified over phone and requested to check placement.      Vision Patient Visual Report: No change from baseline     Perception     Praxis      Cognition Arousal/Alertness: Awake/alert Behavior During Therapy: WFL for tasks assessed/performed Overall Cognitive Status: Within Functional Limits for tasks assessed  Exercises Other Exercises Other Exercises: Increased time spent on pt and family education re: in bed and seated exercise, importance of mobility for healing and prevention of further decline in strength and function. OT provided visual demonstration of appropriate in bed and chair UE therex with emphasis on monitoring how her wound and surrounding area feel with all movement and activity.  Other Exercises: pt educated energy conservation strategies, potential home/routine modifications, and falls prevention strategies d/t current lines/tubes with pt and dtr verbalizing  understanding of fall prevention.    Shoulder Instructions       General Comments      Pertinent Vitals/ Pain       Pain Assessment: 0-10 Pain Score: 4  Pain Location: Abdomen Pain Descriptors / Indicators: Aching;Constant;Dull Pain Intervention(s): Limited activity within patient's tolerance  Home Living                                          Prior Functioning/Environment              Frequency  Min 1X/week        Progress Toward Goals  OT Goals(current goals can now be found in the care plan section)  Progress towards OT goals: Progressing toward goals  Acute Rehab OT Goals Patient Stated Goal: to improve mobility  Plan Discharge plan remains appropriate;Frequency remains appropriate    Co-evaluation                 AM-PAC OT "6 Clicks" Daily Activity     Outcome Measure   Help from another person eating meals?: None(pt can simulate with no assitance, however, on TPN and NPO at this time.) Help from another person taking care of personal grooming?: None Help from another person toileting, which includes using toliet, bedpan, or urinal?: A Little Help from another person bathing (including washing, rinsing, drying)?: A Lot Help from another person to put on and taking off regular upper body clothing?: A Little Help from another person to put on and taking off regular lower body clothing?: A Lot 6 Click Score: 18    End of Session    OT Visit Diagnosis: Muscle weakness (generalized) (M62.81)   Activity Tolerance Patient tolerated treatment well   Patient Left in bed;with call bell/phone within reach;with family/visitor present   Nurse Communication Other (comment)(nurse notified via phone re: pt and dtr concerns about adbominal wound vac catheter placement and need for change of drainage container with RN verbalizing understanding. )        Time: 8295-6213 OT Time Calculation (min): 53 min  Charges: OT General  Charges $OT Visit: 1 Visit OT Treatments $Self Care/Home Management : 53-67 mins  Gerrianne Scale, MS, OTR/L ascom [phone] or [phone] 10/14/18, 12:26 PM

## 2018-10-14 NOTE — Consult Note (Signed)
PHARMACY - ADULT TOTAL PARENTERAL NUTRITION CONSULT NOTE   Pharmacy Consult for TPN management Indication: malnutrition  Patient Measurements: Height: 5\' 3"  (160 cm) Weight: 168 lb 12.8 oz (76.6 kg) IBW/kg (Calculated) : 52.4 TPN AdjBW (KG): 77 Body mass index is 29.9 kg/m.  Assessment: 72 y/o female with h/o transverse colectomy for unresectable colon polyp on 08/23/18 recent admission for incisional abscess and wound dehiscence requiring exploratory laparotomy incisional abscess washout, primary closure of anastomosis leak, placement of phasix mesh 11/17; pt now admitted for abdominal pain, stool in JP drain and wound dehiscence   This patient had a recent admission and received TPN from 11/22-12/3 and tolerated TPN well. Pt reports that her appetite and oral intake have been severely decreased over the past week. She has lost ~15-20lb(9%) wt loss over the past 3 weeks.  Suspect pt at high refeeding risk; will monitor electrolytes closely.   GI:  Endo: sensitive SSI q6h - Insulin requirements in the past 24 hours: 2 BG over past 24 hours 83 -153 Lytes: WNL  Renal: SCr 0.74 (12/24) ID: WBC wnl (12/23)  TPN Access: 12/18 TPN start date: 12/18 Nutritional Goals (per RD recommendation on 12/18): Regimen @ goal rate provides 1894kcal/day, 100g/day protein, 2270ml volume   Goal TPN rate is 83 ml/hr (provides 90% of their needs)  Current Nutrition: NPO except sips with meds + TPN  Plan:  Continue Clinimix E 5/15 TPN at goal rate of 83 mL/hr + 20% lipids at 66mL/hr (no changes today 12/27 per dietitian Myriam Jacobson)  Additional Electrolytes in TPN: none Add MVI, trace elements, vitC 500 g Continue q6h sensitive SSI and adjust as needed  Monitor TPN labs P, K and Mg at least daily for 3 days then Mon,Thur if WNL F/U electrolytes on Fri 12/27.   Lu Duffel, PharmD, BCPS Clinical Pharmacist 10/14/2018 10:53 AM

## 2018-10-14 NOTE — Progress Notes (Signed)
PT Cancellation Note  Patient Details Name: Cassie Carlson MRN: [id number] DOB: [birth year]   Cancelled Treatment:    Reason Eval/Treat Not Completed: Other (comment)(Upon entering room, OT at bedside prepping for session. PT will follow up as able.)   Lieutenant Diego PT, DPT 11:53 AM,10/14/18  [phone]

## 2018-10-14 NOTE — Progress Notes (Signed)
Subjective:  CC:  Cassie Carlson is a 72 y.o. female  Hospital stay day 10,    08/22/18  lap converted to open transverse colectomy for unresectable colon polyp.  09/03/18 exlap, wound washout, Phasix mesh placement over dehiscence from above.   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  Noted to have about 445ml out past 12hrs, so it has increased since stopping the octreotide.  ROS:  A 5 point review of systems was performed and pertinent positives and negatives noted in HPI.   Objective:      Temp:  [97.9 F (36.6 C)-98.1 F (36.7 C)] 97.9 F (36.6 C) (12/27 1141) Pulse Rate:  [68-71] 70 (12/27 1143) Resp:  [12-16] 12 (12/27 1141) BP: (109-119)/(42-57) 109/52 (12/27 1143) SpO2:  [95 %-98 %] 98 % (12/27 1141) Weight:  [76.6 kg] 76.6 kg (12/27 0500)     Height: 5\' 3"  (160 cm) Weight: 76.6 kg BMI (Calculated): 29.91   Intake/Output this shift:   Intake/Output Summary (Last 24 hours) at 10/14/2018 1601 Last data filed at 10/14/2018 1407 Gross per 24 hour  Intake 1854.65 ml  Output 950 ml  Net 904.65 ml   532ml/24hr of fistula output  Constitutional :  alert, cooperative, appears stated age and no distress  Respiratory:  clear to auscultation bilaterally  Cardiovascular:  regular rate and rhythm  Gastrointestinal: soft, non-tender; bowel sounds normal; no masses,  no organomegaly. Midline wound covered in Eakin pouch and ileostomy pouch intact.  Midline wound has thick granulation tissue wound around the edges that is slowly closing in space.  The open space measures 6cm x 4 cm x 3cm deep.  The measurments total 8cm x 5cm x 3cm if you  include the granluation tissue and measure from regular skin edge to skin edge.  Output is still bilious, draining well through foley catheter.  The former blake drain site closed now.  Ileostomy remains patent with scant output, mucosa pink.   Skin: Cool and moist.   Psychiatric: Normal affect, non-agitated, not confused       LABS:  CMP Latest Ref Rng & Units 10/13/2018 10/11/2018 10/10/2018  Glucose 70 - 99 mg/dL 129(H) 83 98  BUN 8 - 23 mg/dL 33(H) 30(H) 32(H)  Creatinine 0.44 - 1.00 mg/dL 0.72 0.74 0.64  Sodium 135 - 145 mmol/L 138 137 137  Potassium 3.5 - 5.1 mmol/L 4.3 4.6 4.0  Chloride 98 - 111 mmol/L 105 105 105  CO2 22 - 32 mmol/L 26 25 26   Calcium 8.9 - 10.3 mg/dL 8.6(L) 8.8(L) 8.4(L)  Total Protein 6.5 - 8.1 g/dL 5.8(L) - 5.9(L)  Total Bilirubin 0.3 - 1.2 mg/dL 0.4 - 0.4  Alkaline Phos 38 - 126 U/L 77 - 66  AST 15 - 41 U/L 49(H) - 37  ALT 0 - 44 U/L 41 - 32   CBC Latest Ref Rng & Units 10/10/2018 10/09/2018 10/08/2018  WBC 4.0 - 10.5 K/uL 6.8 5.9 5.6  Hemoglobin 12.0 - 15.0 g/dL 10.0(L) 10.3(L) 10.1(L)  Hematocrit 36.0 - 46.0 % 31.8(L) 32.4(L) 31.6(L)  Platelets 150 - 400 K/uL 267 276 295    RADS: n/a Assessment:  08/22/18  lap converted to open transverse colectomy for unresectable colon polyp.  09/03/18 exlap, wound washout, Phasix mesh placement over dehiscence from above.   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  Switch tincture of opium to cholystiramine to see if any further decrease.  Daughter reported some confusion after starting the tincture so will discontinue, especially with her past history of severe  confusion with narcotic use.   Continue   NPO, TPN and local wound care. Eakin pouch seems to be best option, and wound seems to be continue to decrease in size, patient remain otherwise stable.    Will keep inhouse until we can further decrease fistula ouput

## 2018-10-15 LAB — GLUCOSE, CAPILLARY
GLUCOSE-CAPILLARY: 111 mg/dL — AB (ref 70–99)
Glucose-Capillary: 135 mg/dL — ABNORMAL HIGH (ref 70–99)
Glucose-Capillary: 105 mg/dL — ABNORMAL HIGH (ref 70–99)
Glucose-Capillary: 120 mg/dL — ABNORMAL HIGH (ref 70–99)

## 2018-10-15 MED ORDER — FAT EMULSION PLANT BASED 20 % IV EMUL
240.0000 mL | INTRAVENOUS | Status: AC
  Administered 2018-10-15: 240 mL via INTRAVENOUS
  Filled 2018-10-15: qty 240

## 2018-10-15 MED ORDER — TRACE MINERALS CR-CU-MN-SE-ZN 10-1000-500-60 MCG/ML IV SOLN
INTRAVENOUS | Status: AC
  Administered 2018-10-15: 17:00:00 via INTRAVENOUS
  Filled 2018-10-15: qty 1992

## 2018-10-15 NOTE — Progress Notes (Signed)
Subjective:  CC:  Cassie Carlson is a 72 y.o. female  Hospital stay day 11,    08/22/18  lap converted to open transverse colectomy for unresectable colon polyp.  09/03/18 exlap, wound washout, Phasix mesh placement over dehiscence from above.   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  Still some nausea with the octreotide.  Tolerated cholysteramine.   ROS:  A 5 point review of systems was performed and pertinent positives and negatives noted in HPI.   Objective:      Temp:  [97.9 F (36.6 C)-98.1 F (36.7 C)] 98.1 F (36.7 C) (12/28 1533) Pulse Rate:  [68-70] 68 (12/28 1533) Resp:  [14-20] 14 (12/28 1533) BP: (112-117)/(51-57) 114/57 (12/28 1533) SpO2:  [96 %-98 %] 98 % (12/28 1533) Weight:  [77.3 kg] 77.3 kg (12/28 0518)     Height: 5\' 3"  (160 cm) Weight: 77.3 kg BMI (Calculated): 30.2   Intake/Output this shift:   Intake/Output Summary (Last 24 hours) at 10/15/2018 1725 Last data filed at 10/15/2018 1451 Gross per 24 hour  Intake 1641.92 ml  Output 1375 ml  Net 266.92 ml   344ml/24hr of fistula output  Constitutional :  alert, cooperative, appears stated age and no distress  Respiratory:  clear to auscultation bilaterally  Cardiovascular:  regular rate and rhythm  Gastrointestinal: soft, non-tender; bowel sounds normal; no masses,  no organomegaly. Midline wound covered in Eakin pouch and ileostomy pouch intact.  Midline wound has thick granulation tissue wound around the edges that is slowly closing in space.  The open space measures 6cm x 4 cm x 3cm deep.  The measurments total 8cm x 5cm x 3cm if you include the granluation tissue and measure from regular skin edge to skin edge.  Output is still  bilious, draining well through foley catheter.  The former blake drain site closed now.  Ileostomy remains patent with scant output, mucosa pink.   Skin: Cool and moist.   Psychiatric: Normal affect, non-agitated, not confused       LABS:  CMP Latest Ref Rng & Units 10/13/2018 10/11/2018 10/10/2018  Glucose 70 - 99 mg/dL 129(H) 83 98  BUN 8 - 23 mg/dL 33(H) 30(H) 32(H)  Creatinine 0.44 - 1.00 mg/dL 0.72 0.74 0.64  Sodium 135 - 145 mmol/L 138 137 137  Potassium 3.5 - 5.1 mmol/L 4.3 4.6 4.0  Chloride 98 - 111 mmol/L 105 105 105  CO2 22 - 32 mmol/L 26 25 26   Calcium 8.9 - 10.3 mg/dL 8.6(L) 8.8(L) 8.4(L)  Total Protein 6.5 - 8.1 g/dL 5.8(L) - 5.9(L)  Total Bilirubin 0.3 - 1.2 mg/dL 0.4 - 0.4  Alkaline Phos 38 - 126 U/L 77 - 66  AST 15 - 41 U/L 49(H) - 37  ALT 0 - 44 U/L 41 - 32   CBC Latest Ref Rng & Units 10/10/2018 10/09/2018 10/08/2018  WBC 4.0 - 10.5 K/uL 6.8 5.9 5.6  Hemoglobin 12.0 - 15.0 g/dL 10.0(L) 10.3(L) 10.1(L)  Hematocrit 36.0 - 46.0 % 31.8(L) 32.4(L) 31.6(L)  Platelets 150 - 400 K/uL 267 276 295    RADS: n/a Assessment:    08/22/18  lap converted to open transverse colectomy for unresectable colon polyp.  09/03/18 exlap, wound washout, Phasix mesh  placement over dehiscence from above.   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  Slightly less output today so will see if further decrease in next 24hrs.    Continue   NPO, TPN and local wound care. Eakin pouch seems to be best option, and wound seems to be continue to decrease in size, patient remain otherwise stable.  Ok to clamp tube to ambulate, shower, etc.    Will keep inhouse until we can further decrease  fistula ouput

## 2018-10-15 NOTE — Consult Note (Signed)
PHARMACY - ADULT TOTAL PARENTERAL NUTRITION CONSULT NOTE   Pharmacy Consult for TPN management Indication: malnutrition  Patient Measurements: Height: 5\' 3"  (160 cm) Weight: 170 lb 6.7 oz (77.3 kg) IBW/kg (Calculated) : 52.4 TPN AdjBW (KG): 77 Body mass index is 30.19 kg/m.  Assessment: 72 y/o female with h/o transverse colectomy for unresectable colon polyp on 08/23/18 recent admission for incisional abscess and wound dehiscence requiring exploratory laparotomy incisional abscess washout, primary closure of anastomosis leak, placement of phasix mesh 11/17; pt now admitted for abdominal pain, stool in JP drain and wound dehiscence   This patient had a recent admission and received TPN from 11/22-12/3 and tolerated TPN well. Pt reports that her appetite and oral intake have been severely decreased over the past week. She has lost ~15-20lb(9%) wt loss over the past 3 weeks.  Suspect pt at high refeeding risk; will monitor electrolytes closely.   GI:  Endo: sensitive SSI q6h - Insulin requirements in the past 24 hours: 2 BG over past 24 hours 83 -153 Lytes: WNL  Renal: SCr 0.74 (12/24) ID: WBC wnl (12/23)  TPN Access: 12/18 TPN start date: 12/18 Nutritional Goals (per RD recommendation on 12/18): Regimen @ goal rate provides 1894kcal/day, 100g/day protein, 2262ml volume   Goal TPN rate is 83 ml/hr (provides 90% of their needs)  Current Nutrition: NPO except sips with meds + TPN  Plan: 10/15/18 we will plan to continue TPN today since there have been no orders to stop. Patient is at goal and will remain on the same rate/formulation.  Continue Clinimix E 5/15 TPN at goal rate of 83 mL/hr + 20% lipids at 26mL/hr (no changes 12/27 per dietitian Myriam Jacobson)  Additional Electrolytes in TPN: none Add MVI, trace elements, vitC 500 g Continue q6h sensitive SSI and adjust as needed  F/U electrolytes on Monday 10/17/2018   Laural Benes, PharmD, BCPS Clinical Pharmacist 10/15/2018 10:08  AM

## 2018-10-16 ENCOUNTER — Encounter: Payer: Self-pay | Admitting: Radiology

## 2018-10-16 ENCOUNTER — Inpatient Hospital Stay: Payer: Medicare Other

## 2018-10-16 LAB — GLUCOSE, CAPILLARY
Glucose-Capillary: 112 mg/dL — ABNORMAL HIGH (ref 70–99)
Glucose-Capillary: 126 mg/dL — ABNORMAL HIGH (ref 70–99)
Glucose-Capillary: 107 mg/dL — ABNORMAL HIGH (ref 70–99)

## 2018-10-16 MED ORDER — FAT EMULSION PLANT BASED 20 % IV EMUL
250.0000 mL | INTRAVENOUS | Status: AC
  Administered 2018-10-16: 250 mL via INTRAVENOUS
  Filled 2018-10-16: qty 250

## 2018-10-16 MED ORDER — IOPAMIDOL (ISOVUE-300) INJECTION 61%
15.0000 mL | INTRAVENOUS | Status: AC
  Administered 2018-10-16 (×2): 15 mL via ORAL

## 2018-10-16 MED ORDER — TRACE MINERALS CR-CU-MN-SE-ZN 10-1000-500-60 MCG/ML IV SOLN
INTRAVENOUS | Status: AC
  Administered 2018-10-16: 18:00:00 via INTRAVENOUS
  Filled 2018-10-16: qty 1992

## 2018-10-16 MED ORDER — IOHEXOL 300 MG/ML  SOLN
100.0000 mL | Freq: Once | INTRAMUSCULAR | Status: AC | PRN
  Administered 2018-10-16: 100 mL via INTRAVENOUS

## 2018-10-16 NOTE — Progress Notes (Signed)
Subjective:  CC:  Cassie Carlson is a 72 y.o. female  Hospital stay day 12,    08/22/18  lap converted to open transverse colectomy for unresectable colon polyp.  09/03/18 exlap, wound washout, Phasix mesh placement over dehiscence from above.   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  Still some nausea with the octreotide.  Tolerating cholysteramine.   ROS:  A 5 point review of systems was performed and pertinent positives and negatives noted in HPI.   Objective:      Temp:  [98.1 F (36.7 C)-98.3 F (36.8 C)] 98.1 F (36.7 C) (12/29 0542) Pulse Rate:  [68-71] 71 (12/29 0542) Resp:  [14-20] 20 (12/29 0542) BP: (112-124)/(51-57) 112/52 (12/29 0542) SpO2:  [96 %-99 %] 99 % (12/29 0542) Weight:  [76.8 kg] 76.8 kg (12/29 0500)     Height: 5\' 3"  (160 cm) Weight: 76.8 kg BMI (Calculated): 30   Intake/Output this shift:   Intake/Output Summary (Last 24 hours) at 10/16/2018 5277 Last data filed at 10/16/2018 0604 Gross per 24 hour  Intake 2090.36 ml  Output 950 ml  Net 1140.36 ml   327ml/24hr of fistula output  Constitutional :  alert, cooperative, appears stated age and no distress  Respiratory:  clear to auscultation bilaterally  Cardiovascular:  regular rate and rhythm  Gastrointestinal: soft, non-tender; bowel sounds normal; no masses,  no organomegaly. Midline wound covered in Eakin pouch and ileostomy pouch intact.  Midline wound has thick granulation tissue wound around the edges that is slowly closing in space.  The open space measures 6cm x 4 cm x 3cm deep.  The measurments total 8cm x 5cm x 3cm if you include the granluation tissue and measure from regular skin edge to skin edge.  Output is yellow  tinged secondary to cholestyramine, draining well through foley catheter.  The former blake drain site closed now.  Ileostomy remains patent with scant output, mucosa pink.   Skin: Cool and moist.   Psychiatric: Normal affect, non-agitated, not confused       LABS:  CMP Latest Ref Rng & Units 10/13/2018 10/11/2018 10/10/2018  Glucose 70 - 99 mg/dL 129(H) 83 98  BUN 8 - 23 mg/dL 33(H) 30(H) 32(H)  Creatinine 0.44 - 1.00 mg/dL 0.72 0.74 0.64  Sodium 135 - 145 mmol/L 138 137 137  Potassium 3.5 - 5.1 mmol/L 4.3 4.6 4.0  Chloride 98 - 111 mmol/L 105 105 105  CO2 22 - 32 mmol/L 26 25 26   Calcium 8.9 - 10.3 mg/dL 8.6(L) 8.8(L) 8.4(L)  Total Protein 6.5 - 8.1 g/dL 5.8(L) - 5.9(L)  Total Bilirubin 0.3 - 1.2 mg/dL 0.4 - 0.4  Alkaline Phos 38 - 126 U/L 77 - 66  AST 15 - 41 U/L 49(H) - 37  ALT 0 - 44 U/L 41 - 32   CBC Latest Ref Rng & Units 10/10/2018 10/09/2018 10/08/2018  WBC 4.0 - 10.5 K/uL 6.8 5.9 5.6  Hemoglobin 12.0 - 15.0 g/dL 10.0(L) 10.3(L) 10.1(L)  Hematocrit 36.0 - 46.0 % 31.8(L) 32.4(L) 31.6(L)  Platelets 150 - 400 K/uL 267 276 295    RADS: n/a Assessment:    08/22/18  lap converted to open transverse colectomy for unresectable colon polyp.  09/03/18 exlap, wound  washout, Phasix mesh placement over dehiscence from above.   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  Back up to 41ml/24hrs   Continue   NPO, TPN and local wound care. Eakin pouch seems to be best option, and wound seems to be continue to decrease in size, patient remain otherwise stable.  Ok to clamp tube to ambulate, shower, etc.    Will keep inhouse until we can further decrease fistula ouput.  Will obtain  CT a/p with oral and IV contrast today to obtain baseline anatomy of location of this fistula.

## 2018-10-16 NOTE — Progress Notes (Signed)
Chaplain responded to an OR and page for support of a Pt with life changes. Chaplain practiced active listening and reflective questioning as Pt talked about her family. She discussed her daughter and her care for her and the the loses in her life. Chaplain discussed possibility of having a project with her family of doing a Education officer, environmental. Pt agreed.    10/16/18 1100  Clinical Encounter Type  Visited With Patient  Visit Type Follow-up;Spiritual support  Referral From Nurse  Spiritual Encounters  Spiritual Needs Prayer;Emotional

## 2018-10-16 NOTE — Consult Note (Signed)
PHARMACY - ADULT TOTAL PARENTERAL NUTRITION CONSULT NOTE   Pharmacy Consult for TPN management Indication: malnutrition  Patient Measurements: Height: 5\' 3"  (160 cm) Weight: 169 lb 5 oz (76.8 kg) IBW/kg (Calculated) : 52.4 TPN AdjBW (KG): 77 Body mass index is 29.99 kg/m.  Assessment: 72 y/o female with h/o transverse colectomy for unresectable colon polyp on 08/23/18 recent admission for incisional abscess and wound dehiscence requiring exploratory laparotomy incisional abscess washout, primary closure of anastomosis leak, placement of phasix mesh 11/17; pt now admitted for abdominal pain, stool in JP drain and wound dehiscence   This patient had a recent admission and received TPN from 11/22-12/3 and tolerated TPN well. Pt reports that her appetite and oral intake have been severely decreased over the past week. She has lost ~15-20lb(9%) wt loss over the past 3 weeks.  Suspect pt at high refeeding risk; will monitor electrolytes closely.   GI:  Endo: sensitive SSI q6h - Insulin requirements in the past 24 hours: 2 BG over past 24 hours 83 -153 Lytes: WNL  Renal: SCr 0.74 (12/24) ID: WBC wnl (12/23)  TPN Access: 12/18 TPN start date: 12/18 Nutritional Goals (per RD recommendation on 12/18): Regimen @ goal rate provides 1894kcal/day, 100g/day protein, 2261ml volume   Goal TPN rate is 83 ml/hr (provides 90% of their needs)  Current Nutrition: NPO except sips with meds + TPN  Plan: 10/16/18 Continue TPN today since there have been no orders to stop. Patient is at goal and will remain on the same rate/formulation.  Continue Clinimix E 5/15 TPN at goal rate of 83 mL/hr + 20% lipids at 55mL/hr (no changes 12/27 per dietitian Myriam Jacobson)  Additional Electrolytes in TPN: none Add MVI, trace elements, vitC 500 g Continue q6h CBGS and sensitive SSI and adjust as needed  F/U electrolytes on Monday 10/17/2018  Pernell Dupre, PharmD, BCPS Clinical Pharmacist 10/16/2018 10:22 AM

## 2018-10-17 LAB — DIFFERENTIAL
Abs Immature Granulocytes: 0.02 10*3/uL (ref 0.00–0.07)
Basophils Absolute: 0 10*3/uL (ref 0.0–0.1)
Basophils Relative: 0 %
Eosinophils Absolute: 0 10*3/uL (ref 0.0–0.5)
Eosinophils Relative: 1 %
Immature Granulocytes: 0 %
Lymphocytes Relative: 41 %
Lymphs Abs: 1.9 10*3/uL (ref 0.7–4.0)
Monocytes Absolute: 0.6 10*3/uL (ref 0.1–1.0)
Monocytes Relative: 14 %
Neutro Abs: 2 10*3/uL (ref 1.7–7.7)
Neutrophils Relative %: 44 %

## 2018-10-17 LAB — PREALBUMIN: Prealbumin: 26.7 mg/dL (ref 18–38)

## 2018-10-17 LAB — COMPREHENSIVE METABOLIC PANEL
ALT: 35 U/L (ref 0–44)
AST: 40 U/L (ref 15–41)
Albumin: 2.5 g/dL — ABNORMAL LOW (ref 3.5–5.0)
Alkaline Phosphatase: 75 U/L (ref 38–126)
Anion gap: 7 (ref 5–15)
BUN: 29 mg/dL — ABNORMAL HIGH (ref 8–23)
CO2: 26 mmol/L (ref 22–32)
Calcium: 8.6 mg/dL — ABNORMAL LOW (ref 8.9–10.3)
Chloride: 104 mmol/L (ref 98–111)
Creatinine, Ser: 0.77 mg/dL (ref 0.44–1.00)
GFR calc Af Amer: >60 mL/min (ref >60)
GFR calc non Af Amer: >60 mL/min (ref >60)
Glucose, Bld: 94 mg/dL (ref 70–99)
Potassium: 4 mmol/L (ref 3.5–5.1)
Sodium: 137 mmol/L (ref 135–145)
Total Bilirubin: 0.4 mg/dL (ref 0.3–1.2)
Total Protein: 5.6 g/dL — ABNORMAL LOW (ref 6.5–8.1)

## 2018-10-17 LAB — CBC
HCT: 28.9 % — ABNORMAL LOW (ref 36.0–46.0)
Hemoglobin: 9.3 g/dL — ABNORMAL LOW (ref 12.0–15.0)
MCH: 30.3 pg (ref 26.0–34.0)
MCHC: 32.2 g/dL (ref 30.0–36.0)
MCV: 94.1 fL (ref 80.0–100.0)
PLATELETS: 281 10*3/uL (ref 150–400)
RBC: 3.07 MIL/uL — ABNORMAL LOW (ref 3.87–5.11)
RDW: 13.3 % (ref 11.5–15.5)
WBC: 4.6 10*3/uL (ref 4.0–10.5)
nRBC: 0.4 % — ABNORMAL HIGH (ref 0.0–0.2)

## 2018-10-17 LAB — GLUCOSE, CAPILLARY
Glucose-Capillary: 120 mg/dL — ABNORMAL HIGH (ref 70–99)
Glucose-Capillary: 93 mg/dL (ref 70–99)
Glucose-Capillary: 114 mg/dL — ABNORMAL HIGH (ref 70–99)
Glucose-Capillary: 108 mg/dL — ABNORMAL HIGH (ref 70–99)
Glucose-Capillary: 136 mg/dL — ABNORMAL HIGH (ref 70–99)

## 2018-10-17 LAB — MAGNESIUM: Magnesium: 2 mg/dL (ref 1.7–2.4)

## 2018-10-17 LAB — PHOSPHORUS: Phosphorus: 4.4 mg/dL (ref 2.5–4.6)

## 2018-10-17 LAB — TRIGLYCERIDES: Triglycerides: 198 mg/dL — ABNORMAL HIGH (ref <150)

## 2018-10-17 MED ORDER — OCTREOTIDE ACETATE 100 MCG/ML IJ SOLN
50.0000 ug | Freq: Every day | INTRAMUSCULAR | Status: DC
  Administered 2018-10-17: 50 ug via INTRAVENOUS
  Filled 2018-10-17 (×3): qty 0.5

## 2018-10-17 MED ORDER — TRACE MINERALS CR-CU-MN-SE-ZN 10-1000-500-60 MCG/ML IV SOLN
INTRAVENOUS | Status: AC
  Administered 2018-10-17: 18:00:00 via INTRAVENOUS
  Filled 2018-10-17: qty 1992

## 2018-10-17 MED ORDER — FAT EMULSION PLANT BASED 20 % IV EMUL
250.0000 mL | INTRAVENOUS | Status: AC
  Administered 2018-10-17: 250 mL via INTRAVENOUS
  Filled 2018-10-17: qty 250

## 2018-10-17 NOTE — Consult Note (Signed)
  Dr Lysle Pearl at bedside.  Daughter observing pouch change today and wall suction will be removed today for a trial with bedside drainage.  [place] Nurse wound follow up Wound type:Midilne abdominal fistula Measurement: 5 cm x 4 cm x 1 cm smaller measurements today Pseudostoma present at 6 o'clock.  Wound bed: see above Drainage (amount, consistency, odor) moderate feculent effluent in suction canister on wall.  Periwound:partial thickness denuded skin Protected with skin  prep and barrie rring Dressing procedure/placement/frequency: Eakin pouch change.  NO suction per Dr Lysle Pearl for trial run. Supplies at bedside if needs to be re-attached.   [place] Nurse ostomy follow up Stoma type/location: RLQ ileostomy, minimal output.  POuch change performed.  Stomal assessment/size: Oval  Creasing at 3 and 9 o'clock Peristomal assessment: Add barrier ring Treatment options for stomal/peristomal skin: barrier ring Output scant brown effluent Ostomy pouching: 2pc. 2 1/4"   Education provided: daughter recorded pouch change for use at home.  Enrolled patient in [place] Start Discharge program: Yes [place] team will follow.   Domenic Moras MSN, RN, FNP-BC CWON Wound, Ostomy, Continence Nurse Pager [phone]

## 2018-10-17 NOTE — Care Management (Signed)
Notified that plan is for patient to return home with home health, TPN, and private duty nursing.  RNCM met with patient. Patient request to continue conversation when daughter returns.  Patient to notify me when daughter arrives.

## 2018-10-17 NOTE — Progress Notes (Addendum)
Subjective:  CC:  Cassie Carlson is a 72 y.o. female  Hospital stay day 13,    08/22/18  lap converted to open transverse colectomy for unresectable colon polyp.  09/03/18 exlap, wound washout, Phasix mesh placement over dehiscence from above.   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 woresening nausea with the octreotide.  Tolerating cholysteramine.    ROS:  A 5 point review of systems was performed and pertinent positives and negatives noted in HPI.   Objective:      Temp:  [97.4 F (36.3 C)-98.2 F (36.8 C)] 98.1 F (36.7 C) (12/30 0543) Pulse Rate:  [67-79] 67 (12/30 0543) Resp:  [16-20] 20 (12/30 0543) BP: (103-119)/(57-63) 103/63 (12/30 0543) SpO2:  [94 %-99 %] 97 % (12/30 0543) Weight:  [76.1 kg] 76.1 kg (12/30 0500)     Height: 5\' 3"  (160 cm) Weight: 76.1 kg BMI (Calculated): 29.73   Intake/Output this shift:   Intake/Output Summary (Last 24 hours) at 10/17/2018 4098 Last data filed at 10/17/2018 0524 Gross per 24 hour  Intake 1017.92 ml  Output 250 ml  Net 767.92 ml   333ml/24hr of fistula output  Constitutional :  alert, cooperative, appears stated age and no distress  Respiratory:  clear to auscultation bilaterally  Cardiovascular:  regular rate and rhythm  Gastrointestinal: soft, non-tender; bowel sounds normal; no masses,  no organomegaly. Midline wound covered in Eakin pouch and ileostomy pouch intact.  Midline wound has thick granulation tissue wound around the edges that is slowly closing in space.  The open space measures approx 5cm x 4 cm x 1cm deep now.  Output is yellow tinged secondary to cholestyramine, draining well through foley catheter.  The former blake drain site closed now.   Ileostomy remains patent with scant output, mucosa pink.   Skin: Cool and moist.   Psychiatric: Normal affect, non-agitated, not confused       LABS:  CMP Latest Ref Rng & Units 10/17/2018 10/13/2018 10/11/2018  Glucose 70 - 99 mg/dL 94 129(H) 83  BUN 8 - 23 mg/dL 29(H) 33(H) 30(H)  Creatinine 0.44 - 1.00 mg/dL 0.77 0.72 0.74  Sodium 135 - 145 mmol/L 137 138 137  Potassium 3.5 - 5.1 mmol/L 4.0 4.3 4.6  Chloride 98 - 111 mmol/L 104 105 105  CO2 22 - 32 mmol/L 26 26 25   Calcium 8.9 - 10.3 mg/dL 8.6(L) 8.6(L) 8.8(L)  Total Protein 6.5 - 8.1 g/dL 5.6(L) 5.8(L) -  Total Bilirubin 0.3 - 1.2 mg/dL 0.4 0.4 -  Alkaline Phos 38 - 126 U/L 75 77 -  AST 15 - 41 U/L 40 49(H) -  ALT 0 - 44 U/L 35 41 -   CBC Latest Ref Rng & Units 10/17/2018 10/10/2018 10/09/2018  WBC 4.0 - 10.5 K/uL 4.6 6.8 5.9  Hemoglobin 12.0 - 15.0 g/dL 9.3(L) 10.0(L) 10.3(L)  Hematocrit 36.0 - 46.0 % 28.9(L) 31.8(L) 32.4(L)  Platelets 150 - 400 K/uL 281 267 276    RADS: CLINICAL DATA:  Patient with history of transverse colectomy for colonic polyp now with wound dehiscence and incisional abscess. 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  EXAM: CT ABDOMEN AND PELVIS WITH CONTRAST  TECHNIQUE: Multidetector CT imaging of the  abdomen and pelvis was performed using the standard protocol following bolus administration of intravenous contrast.  CONTRAST:  158mL OMNIPAQUE IOHEXOL 300 MG/ML  SOLN  COMPARISON:  CT abdomen pelvis 09/03/2018  FINDINGS: Lower chest: Normal heart size. Dependent atelectasis within the lower lobes bilaterally. No pleural effusion. Bilateral breast implants. Bilateral capsular calcifications.  Hepatobiliary: Liver is normal in size and contour. No focal hepatic lesion is identified. Gallbladder surgically absent. No intrahepatic or extrahepatic biliary ductal dilatation.  Pancreas: Unremarkable  Spleen: Unremarkable  Adrenals/Urinary Tract:  Normal adrenal glands. Kidneys enhance symmetrically with contrast. Subcentimeter too small to characterize low-attenuation lesion interpolar region left kidney. No hydronephrosis. Urinary bladder is unremarkable.  Stomach/Bowel: Interval placement of right lower anterior abdominal wall ileostomy. Interval reanastomosis of the transverse colon. There is a loop of small bowel which appears to course into the anterior abdominal wall incision (image 40; series 2). No evidence for small bowel obstruction. Scattered fat stranding throughout the omentum and mesentery.  Vascular/Lymphatic: Normal caliber abdominal aorta. Peripheral calcified atherosclerotic plaque. No retroperitoneal lymphadenopathy.  Reproductive: Status post hysterectomy.  Other: Ventral abdominal wall postsurgical changes. There is high attenuation material located deep to the wound VAC within the anterior abdominal wall (image 37; series 2).  Musculoskeletal: No aggressive or acute appearing osseous lesions.  IMPRESSION: 1. Patient status post interval reanastomosis of the transverse colon and placement of right lower anterior abdominal wall ileostomy. 2. 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 There is a loop of small bowel which courses into the anterior abdominal wall wound without definable separating fat plane, potentially the source of the EC fistula.   Electronically Signed   By: Lovey Newcomer M.D.   On: 10/16/2018 14:21 Assessment:    08/22/18  lap converted to open transverse colectomy for unresectable colon polyp.  09/03/18 exlap, wound washout, Phasix mesh placement over dehiscence from above.   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  Continue cholysteramine.     Continue   NPO, TPN and local wound care. Eakin pouch seems to be best option, and wound seems to be continue to decrease in size, patient remain otherwise stable.  Will attempt to place drainage to gravity in preparation for transition to home care only.  Pt not a candidate for LTAC due to medicare rules requiring ICU stay to be a candidate.  Ok to clamp tube to ambulate, shower, etc.    Will keep inhouse until we know eakin pouch to gravity alone is sufficient to keep output contained.  Will discuss with daughter about possibly obtaining second opinion from tertiary care center re: further ways to decrease output on outpt or inpt basis.

## 2018-10-17 NOTE — Consult Note (Signed)
PHARMACY - ADULT TOTAL PARENTERAL NUTRITION CONSULT NOTE   Pharmacy Consult for TPN management Indication: malnutrition  Patient Measurements: Height: 5\' 3"  (160 cm) Weight: 167 lb 12.3 oz (76.1 kg) IBW/kg (Calculated) : 52.4 TPN AdjBW (KG): 77 Body mass index is 29.72 kg/m.  Assessment: 72 y/o female with h/o transverse colectomy for unresectable colon polyp on 08/23/18 recent admission for incisional abscess and wound dehiscence requiring exploratory laparotomy incisional abscess washout, primary closure of anastomosis leak, placement of phasix mesh 11/17; pt now admitted for abdominal pain, stool in JP drain and wound dehiscence   This patient had a recent admission and received TPN from 11/22-12/3 and tolerated TPN well. Pt reported that her appetite and oral intake had been severely decreased over the previous week. She had lost ~15-20lb(9%) wt loss over the past 3 weeks.  Suspect pt at high refeeding risk; will monitor electrolytes closely.   GI: triglycerides have increased above normal limits but still less than 200. A follow-up level is scheduled Endo: sensitive SSI q6h - Insulin requirements in the past 24 hours: 0 BG over past 24 hours 93-126 Lytes: WNL  Renal: Scr<1  ID: WBC wnl  TPN Access: 12/18 TPN start date: 12/18 Nutritional Goals (per RD recommendation on 12/18): Regimen @ goal rate provides 1894kcal/day, 100g/day protein, 2224ml volume   Goal TPN rate is 83 ml/hr (provides 90% of their needs)  Current Nutrition: NPO except sips with meds + TPN  Plan: 10/17/18 Continue TPN today. Patient is at goal and will remain on the same rate/formulation.  Continue Clinimix E 5/15 TPN at goal rate of 83 mL/hr + 20% lipids at 69mL/hr Additional Electrolytes in TPN: none Add MVI, trace elements, vitC 500 mg Continue q6h CBGS and sensitive SSI and adjust as needed  F/U electrolytes on Thursday 10/20/2018  Dallie Piles, PharmD Clinical Pharmacist 10/17/2018 12:12  PM

## 2018-10-17 NOTE — Progress Notes (Signed)
PT Cancellation Note  Patient Details Name: Cassie Carlson MRN: [id number] DOB: [birth year]   Cancelled Treatment:     Therapy attempted, patient had just returned to bed after sitting up in recliner for several hours. Declined ambulating this afternoon, requested that I return in the am.    [place] Console 10/17/2018, 3:14 PM

## 2018-10-18 LAB — GLUCOSE, CAPILLARY
Glucose-Capillary: 107 mg/dL — ABNORMAL HIGH (ref 70–99)
Glucose-Capillary: 113 mg/dL — ABNORMAL HIGH (ref 70–99)
Glucose-Capillary: 115 mg/dL — ABNORMAL HIGH (ref 70–99)

## 2018-10-18 MED ORDER — TRACE MINERALS CR-CU-MN-SE-ZN 10-1000-500-60 MCG/ML IV SOLN
INTRAVENOUS | Status: AC
  Administered 2018-10-18: 19:00:00 via INTRAVENOUS
  Filled 2018-10-18: qty 1992

## 2018-10-18 MED ORDER — FAT EMULSION PLANT BASED 20 % IV EMUL
250.0000 mL | INTRAVENOUS | Status: AC
  Administered 2018-10-18: 250 mL via INTRAVENOUS
  Filled 2018-10-18: qty 250

## 2018-10-18 NOTE — Consult Note (Signed)
PHARMACY - ADULT TOTAL PARENTERAL NUTRITION CONSULT NOTE   Pharmacy Consult for TPN management Indication: malnutrition  Patient Measurements: Height: 5\' 3"  (160 cm) Weight: 164 lb 7.4 oz (74.6 kg) IBW/kg (Calculated) : 52.4 TPN AdjBW (KG): 77 Body mass index is 29.13 kg/m.  Assessment: 72 y/o female with h/o transverse colectomy for unresectable colon polyp on 08/23/18 recent admission for incisional abscess and wound dehiscence requiring exploratory laparotomy incisional abscess washout, primary closure of anastomosis leak, placement of phasix mesh 11/17; pt now admitted for abdominal pain, stool in JP drain and wound dehiscence   This patient had a recent admission and received TPN from 11/22-12/3 and tolerated TPN well. Pt reported that her appetite and oral intake had been severely decreased over the previous week. She had lost ~15-20lb(9%) wt loss over the past 3 weeks.  Suspect pt at high refeeding risk; will monitor electrolytes closely.   GI: triglycerides have increased above normal limits but still less than 200. A follow-up level is scheduled Endo: sensitive SSI q6h - Insulin requirements in the past 24 hours: 1 unit BG over past 24 hours 93-126 Lytes: WNL  Renal: Scr<1  ID: WBC wnl  TPN Access: 12/18 TPN start date: 12/18 Nutritional Goals (per RD recommendation on 12/18): Regimen @ goal rate provides 1894kcal/day, 100g/day protein, 2277ml volume   Goal TPN rate is 83 ml/hr (provides 90% of their needs)  Current Nutrition: NPO except sips with meds + TPN  Plan: 10/18/18 Continue TPN today. Patient is at goal and will remain on the same rate/formulation. The plan is for patient to return home with home health, TPN, and private duty nursing  Continue Clinimix E 5/15 TPN at goal rate of 83 mL/hr + 20% lipids at 42mL/hr Additional Electrolytes in TPN: none Add MVI, trace elements, vitC 500 mg Continue q6h CBGS and sensitive SSI and adjust as needed  F/U electrolytes  on Thursday 10/20/2018  Dallie Piles, PharmD Clinical Pharmacist 10/18/2018 12:02 PM

## 2018-10-18 NOTE — Care Management (Signed)
RNCM met with patient, daughter, and MD at bedside.  Patient will be discharging home when medically cleared.  All Parties are in agreement. Daughter request for me to check to see if [facility] home health has a certified wound nurse.  RNCM contacted Cassie with [facility], and they do not have a certified wound nurse.  Daughter Sharyn Lull specifically request to switch to [place].  CMS Medicare.gov Compare Post Acute Care list reviewed with daughter and patient.  Referral made to [facility] with [place].  Patient will need RN for wound care and TPN.    Per MD anticipate discharge for Thursday.  Brad with [facility], patient and daughter all aware of tentative discharge.

## 2018-10-18 NOTE — Progress Notes (Signed)
Nutrition Follow Up Note   DOCUMENTATION CODES:   Severe malnutrition in context of acute illness/injury  INTERVENTION:   Continue Clinimix 5/15 with electrolytes at goal rate of 22m/hr   Continue 20% lipids _0 /hr x 12 hrs/day   Regimen @ goal rate provides 1894kcal/day, 100g/day protein, 22336mvolume    Cotninue MVI daily and trace elements daily    Continue vitamin C 5007maily    Pt at high refeeding risk; recommend check P, K, and Mg daily for 3 days after TPN iniatition.    Daily weights   NUTRITION DIAGNOSIS:   Severe Malnutrition related to acute illness(abdominal wound dehiscence and fistula ) as evidenced by 9 percent weight loss in 3 weeks, severe fat depletion in orbital region, moderate muscle depletions in BLE, energy intake < or equal to 50% for > or equal to 5 days.  GOAL:   Patient will meet greater than or equal to 90% of their needs  -met with TPN  MONITOR:   Labs, Weight trends, Skin, I & O's, Other (Comment)(TPN)  ASSESSMENT:   [age]o female with h/o transverse colectomy for unresectable colon polyp on 08/23/18 recent admission for incisional abscess and wound dehiscence requiring exploratory laparotomy incisional abscess washout, primary closure of anastomosis leak, placement of phasix mesh 11/17; pt now admitted with entero-atmospheric fistula   Pt tolerating TPN at goal rate. Plan is for TPN after discharge to support wound healing. Refeed labs stable. Per chart, pt's weight initially up ~9lbs; pt diuresing well and wt now only ~6lbs up from admit. UOP not documented. Pt with 250m64mtput from fistula x 24 hrs.   Medications reviewed and include: questran, lovenox, insulin, synthroid, imodium, octreotide, protonix  Labs reviewed: K 4.0 wnl, P 4.4 wnl, Mg 2.0 wnl- 12/30 Prealbumin 26.7- 12/30 Triglycerides 198- 12/30 cbgs- 115, 113 x 24 hrs  Diet Order:   Diet Order            Diet NPO time specified Except for: Sips with Meds  Diet  effective now             EDUCATION NEEDS:   Education needs have been addressed  Skin:  Skin Assessment: (abdominal incision, open space measures approx 5cm x 4 cm x 1cm deep.  The measurments total 8cm x 5cm x 1cm if you include the granluation tissue and measure from regular skin edge to skin edge.)  Last BM:  12/30- via ostomy   Height:   Ht Readings from Last 1 Encounters:  10/04/18 _1  (1.6 m)    Weight:   Wt Readings from Last 1 Encounters:  10/18/18 77.2 kg    Ideal Body Weight:  52.3 kg  BMI:  Body mass index is 30.15 kg/m.  Estimated Nutritional Needs:   Kcal:  1700-1900kcal/day   Protein:  84-94g/day   Fluid:  >1.4L/day   CaseKoleen Distance RD, LDN Pager #- [phone]#- [phone] Hours Pager: [phone]

## 2018-10-18 NOTE — Progress Notes (Signed)
OT Cancellation Note  Patient Details Name: Cassie Carlson MRN: [id number] DOB: 1945/12/17   Cancelled Treatment:    Reason Eval/Treat Not Completed: Other (comment). Upon attempt to initiate OT tx, pt in room with family and a staff member preparing to have a meeting. Pt requesting OT come back at later time. Will re-attempt at later time today as schedule permits.  Jeni Salles, MPH, MS, OTR/L ascom [phone] 10/18/18, 3:01 PM

## 2018-10-18 NOTE — Consult Note (Signed)
[facility] Nurse wound follow up Eakin pouch is intact and connected to bedside drainage.  No needs at this time.  Will not follow at this time.  Please re-consult if needed.  Domenic Moras MSN, RN, FNP-BC CWON Wound, Ostomy, Continence Nurse Pager [phone]

## 2018-10-18 NOTE — Progress Notes (Signed)
Physical Therapy Treatment Patient Details Name: Cassie Carlson MRN: [id number] DOB: [birth year] Today's Date: 10/18/2018    History of Present Illness Pt. is a 72 y/o female with h/o transverse colectomy for unresectable colon polyp on 08/23/18 recent admission for incisional abscess and wound dehiscence requiring exploratory laparotomy incisional abscess washout, primary closure of anastomosis leak, placement of phasix mesh 11/17; pt now admitted for abdominal pain, stool in JP drain and wound dehiscence.     PT Comments    Ambulated x 2 around nursing unit with no AD.  Slow but steady gait.  Participated in exercises as described below.   Follow Up Recommendations  SNF     Equipment Recommendations       Recommendations for Other Services       Precautions / Restrictions Precautions Precautions: Fall Precaution Comments: abdominal incision with wound suction, colostomy; Restrictions Weight Bearing Restrictions: No    Mobility  Bed Mobility Overal bed mobility: Modified Independent                Transfers Overall transfer level: Modified independent Equipment used: None Transfers: Sit to/from Stand Sit to Stand: Supervision            Ambulation/Gait   Gait Distance (Feet): 330 Feet Assistive device: None Gait Pattern/deviations: Step-through pattern Gait velocity: decreased       Stairs             Wheelchair Mobility    Modified Rankin (Stroke Patients Only)       Balance Overall balance assessment: Needs assistance Sitting-balance support: Feet supported Sitting balance-Leahy Scale: Good     Standing balance support: No upper extremity supported Standing balance-Leahy Scale: Fair                              Cognition Arousal/Alertness: Awake/alert Behavior During Therapy: WFL for tasks assessed/performed Overall Cognitive Status: Within Functional Limits for tasks assessed                                        Exercises Other Exercises Other Exercises: standing marches, heel raises, ab/add, SLR x 10     General Comments        Pertinent Vitals/Pain Pain Assessment: Faces Faces Pain Scale: Hurts a little bit Pain Location: Abdomen Pain Descriptors / Indicators: Aching;Constant;Dull Pain Intervention(s): Limited activity within patient's tolerance    Home Living                      Prior Function            PT Goals (current goals can now be found in the care plan section) Progress towards PT goals: Progressing toward goals    Frequency    Min 2X/week      PT Plan Current plan remains appropriate    Co-evaluation              AM-PAC PT "6 Clicks" Mobility   Outcome Measure  Help needed turning from your back to your side while in a flat bed without using bedrails?: None Help needed moving from lying on your back to sitting on the side of a flat bed without using bedrails?: None Help needed moving to and from a bed to a chair (including a wheelchair)?: None Help needed standing up from a chair using your  arms (e.g., wheelchair or bedside chair)?: None Help needed to walk in hospital room?: A Little Help needed climbing 3-5 steps with a railing? : A Little 6 Click Score: 22    End of Session Equipment Utilized During Treatment: Gait belt Activity Tolerance: Patient limited by fatigue;Patient tolerated treatment well Patient left: in bed;with family/visitor present         Time: 1211-1232 PT Time Calculation (min) (ACUTE ONLY): 21 min  Charges:  $Gait Training: 8-22 mins                     Chesley Noon, PTA 10/18/18, 12:39 PM

## 2018-10-18 NOTE — Progress Notes (Signed)
Subjective:  CC:  Cassie Carlson is a 72 y.o. female  Hospital stay day 14,    08/22/18  lap converted to open transverse colectomy for unresectable colon polyp.  09/03/18 exlap, wound washout, Phasix mesh placement over dehiscence from above.   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  Draining well per patient report.  No other issues.    ROS:  A 5 point review of systems was performed and pertinent positives and negatives noted in HPI.   Objective:      Temp:  [98.2 F (36.8 C)] 98.2 F (36.8 C) (12/31 0428) Pulse Rate:  [71-79] 71 (12/31 0428) Resp:  [18] 18 (12/31 0428) BP: (107-112)/(48-54) 112/48 (12/31 0428) SpO2:  [96 %-100 %] 96 % (12/31 0428) Weight:  [74.6 kg-77.2 kg] 74.6 kg (12/31 1100)     Height: 5\' 3"  (160 cm) Weight: 74.6 kg BMI (Calculated): 29.14   Intake/Output this shift:   Intake/Output Summary (Last 24 hours) at 10/18/2018 1458 Last data filed at 10/18/2018 1447 Gross per 24 hour  Intake 1922 ml  Output 800 ml  Net 1122 ml   352ml/24hr of fistula output  Constitutional :  alert, cooperative, appears stated age and no distress  Respiratory:  clear to auscultation bilaterally  Cardiovascular:  regular rate and rhythm  Gastrointestinal: soft, non-tender; bowel sounds normal; no masses,  no organomegaly. Midline wound covered in Eakin pouch and ileostomy pouch intact.  Midline wound has thick granulation tissue wound around the edges that is slowly closing in space.  The open space measures approx 5cm x 4 cm x 1cm deep now.  Output is yellow tinged secondary to cholestyramine, draining well into catheter bag.  The former blake drain site closed now.   Ileostomy remains patent with scant output, mucosa pink.   Skin: Cool and moist.   Psychiatric: Normal affect, non-agitated, not confused       LABS:  CMP Latest Ref Rng & Units 10/17/2018 10/13/2018 10/11/2018  Glucose 70 - 99 mg/dL 94 129(H) 83  BUN 8 - 23 mg/dL 29(H) 33(H) 30(H)  Creatinine 0.44 - 1.00 mg/dL 0.77 0.72 0.74  Sodium 135 - 145 mmol/L 137 138 137  Potassium 3.5 - 5.1 mmol/L 4.0 4.3 4.6  Chloride 98 - 111 mmol/L 104 105 105  CO2 22 - 32 mmol/L 26 26 25   Calcium 8.9 - 10.3 mg/dL 8.6(L) 8.6(L) 8.8(L)  Total Protein 6.5 - 8.1 g/dL 5.6(L) 5.8(L) -  Total Bilirubin 0.3 - 1.2 mg/dL 0.4 0.4 -  Alkaline Phos 38 - 126 U/L 75 77 -  AST 15 - 41 U/L 40 49(H) -  ALT 0 - 44 U/L 35 41 -   CBC Latest Ref Rng & Units 10/17/2018 10/10/2018 10/09/2018  WBC 4.0 - 10.5 K/uL 4.6 6.8 5.9  Hemoglobin 12.0 - 15.0 g/dL 9.3(L) 10.0(L) 10.3(L)  Hematocrit 36.0 - 46.0 % 28.9(L) 31.8(L) 32.4(L)  Platelets 150 - 400 K/uL 281 267 276    RADS: CLINICAL DATA:  Patient with history of transverse colectomy for colonic polyp now with wound dehiscence and incisional abscess. 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  EXAM: CT ABDOMEN AND PELVIS WITH CONTRAST  TECHNIQUE: Multidetector CT  imaging of the abdomen and pelvis was performed using the standard protocol following bolus administration of intravenous contrast.  CONTRAST:  148mL OMNIPAQUE IOHEXOL 300 MG/ML  SOLN  COMPARISON:  CT abdomen pelvis 09/03/2018  FINDINGS: Lower chest: Normal heart size. Dependent atelectasis within the lower lobes bilaterally. No pleural effusion. Bilateral breast implants. Bilateral capsular calcifications.  Hepatobiliary: Liver is normal in size and contour. No focal hepatic lesion is identified. Gallbladder surgically absent. No intrahepatic or extrahepatic biliary ductal dilatation.  Pancreas: Unremarkable  Spleen: Unremarkable  Adrenals/Urinary Tract:  Normal adrenal glands. Kidneys enhance symmetrically with contrast. Subcentimeter too small to characterize low-attenuation lesion interpolar region left kidney. No hydronephrosis. Urinary bladder is unremarkable.  Stomach/Bowel: Interval placement of right lower anterior abdominal wall ileostomy. Interval reanastomosis of the transverse colon. There is a loop of small bowel which appears to course into the anterior abdominal wall incision (image 40; series 2). No evidence for small bowel obstruction. Scattered fat stranding throughout the omentum and mesentery.  Vascular/Lymphatic: Normal caliber abdominal aorta. Peripheral calcified atherosclerotic plaque. No retroperitoneal lymphadenopathy.  Reproductive: Status post hysterectomy.  Other: Ventral abdominal wall postsurgical changes. There is high attenuation material located deep to the wound VAC within the anterior abdominal wall (image 37; series 2).  Musculoskeletal: No aggressive or acute appearing osseous lesions.  IMPRESSION: 1. Patient status post interval reanastomosis of the transverse colon and placement of right lower anterior abdominal wall ileostomy. 2. 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 There is a loop of small bowel which courses into the anterior abdominal wall wound without definable separating fat plane, potentially the source of the EC fistula.   Electronically Signed   By: Lovey Newcomer M.D.   On: 10/16/2018 14:21 Assessment:    08/22/18  lap converted to open transverse colectomy for unresectable colon polyp.  09/03/18 exlap, wound washout, Phasix mesh placement over dehiscence from above.   09/06/18 Noted to have increased feculant drainage so returned to OR for wound washout, phasix mesh removal, and loop ileostomy creation  10/05/18 Now noted to have new small bowel fistula within the midline wound  granulation bed, with high output  Now admitted for [facility] fistula care  Will decrease octreotide to 15mcg daily from 186mcg, secondary to worsening nausea with administration.  Continue cholysteramine.     Continue   NPO, TPN and local wound care. Eakin pouch seems to be intact, controling output, which had no siginificant change in output since going to 78mcg of octreotide.  Will discontinue completely today and see if any change in output.  If remains stable and bag remains intact, likely d/c in a couple days.  At this point, there is no additional medical interventions that can be given for the time being to further decrease output.  In addition, we have slowly been transitioning the wound care and Eakin pouch maintenance over to the patient, in preparation for discharge.  Once daily TPN infusion is able to be setup, and patient is able to be sent home with ostomy/pouch supplies, she can be d/c'd and f/u on an outpt basis.  Still pending reply back from [facility] regarding timing of transfer of care for further wound care and eventual surgery for fistula takedown and ostomy reversal.  I did encourage the patient to take initiative and observe how the TPN is setup in preparation for d/c, as well as fix any leaks from the pouch that may develop herself while she is still  inhouse to become comfortable with the overall procedure. She was hesitant initially but agreed to overall plan by end of discussion

## 2018-10-18 NOTE — Care Management Important Message (Signed)
Patient aware of Medicare IM right.  Paper copy not left upon patient's request.

## 2018-10-18 NOTE — Progress Notes (Signed)
Occupational Therapy Treatment Patient Details Name: Cassie Carlson MRN: [id number] DOB: 13-Oct-1946 Today's Date: 10/18/2018    History of present illness Pt. is a 72 y/o female with h/o transverse colectomy for unresectable colon polyp on 08/23/18 recent admission for incisional abscess and wound dehiscence requiring exploratory laparotomy incisional abscess washout, primary closure of anastomosis leak, placement of phasix mesh 11/17; pt now admitted for abdominal pain, stool in JP drain and wound dehiscence.    OT comments  Pt seen for OT tx this date. Dtr present. Pt eager to work with therapist, reports feeling better but anxious to return home in a couple days. OT facilitated problem solving with pt/dtr for upcoming return home. Pt expressed worry about managing her anxiety/depression, being lonely, and not being confident in wound care. OT provided reassurance and emotional support and helped pt verbalize strategies she could implement at home - enlisting dtr's help to reach out to friends, set up phone dates with friends, and having friends come visit, setting boundaries with friends, cognitive behavioral strategies for pain and stress mgt, and encouraged using compensatory strategies such as keeping lists or a calendar to support recall of events and "to-do's."  Pt/dtr verbalized understanding and expressed appreciation for education/training. Pt progressing towards goals. Would continue to benefit from skilled OT services to address impairments and functional deficits in order to maximize return to PLOF. Have changed f/u rec to [facility].   Follow Up Recommendations  Home health OT    Equipment Recommendations  None recommended by OT    Recommendations for Other Services      Precautions / Restrictions Precautions Precautions: Fall Precaution Comments: abdominal incision with wound suction, colostomy; Restrictions Weight Bearing Restrictions: No       Mobility                 Balance                             ADL either performed or assessed with clinical judgement   ADL Overall ADL's : Needs assistance/impaired Eating/Feeding: NPO  Pt continues to require assist for LB ADL tasks, but progressing.                                         Vision Patient Visual Report: No change from baseline     Perception     Praxis      Cognition Arousal/Alertness: Awake/alert Behavior During Therapy: WFL for tasks assessed/performed Overall Cognitive Status: Within Functional Limits for tasks assessed                                          Exercises Other Exercises Other Exercises: OT facilitated problem solving with pt/dtr for return home in a couple days. Pt expressed worry about anxiety/depression, being lonely, and not being confident in wound care. OT provided reassurance and emotional support to help pt verbalize strategies she could implement at home - enlisting dtr's help to reach out to friends, set up phone dates and having friends come visit, cognitive behavioral strategies for pain and stress mgt, and encouraged using compensatory strategies such as keeping lists or a calendar to support recall of events and "to'do's"    Shoulder Instructions  General Comments      Pertinent Vitals/ Pain       Pain Assessment: Faces Faces Pain Scale: Hurts a little bit Pain Location: Abdomen Pain Descriptors / Indicators: Aching;Guarding Pain Intervention(s): Limited activity within patient's tolerance;Monitored during session  Home Living                                          Prior Functioning/Environment              Frequency  Min 1X/week        Progress Toward Goals  OT Goals(current goals can now be found in the care plan section)  Progress towards OT goals: Progressing toward goals  Acute Rehab OT Goals Patient Stated Goal: to improve mobility  Plan Frequency  remains appropriate;Discharge plan needs to be updated    Co-evaluation                 AM-PAC OT "6 Clicks" Daily Activity     Outcome Measure   Help from another person eating meals?: None Help from another person taking care of personal grooming?: None Help from another person toileting, which includes using toliet, bedpan, or urinal?: A Little Help from another person bathing (including washing, rinsing, drying)?: A Lot Help from another person to put on and taking off regular upper body clothing?: A Little Help from another person to put on and taking off regular lower body clothing?: A Lot 6 Click Score: 18    End of Session    OT Visit Diagnosis: Muscle weakness (generalized) (M62.81)   Activity Tolerance Patient tolerated treatment well   Patient Left in bed;with call bell/phone within reach;with bed alarm set;with family/visitor present   Nurse Communication          Time: 2353-6144 OT Time Calculation (min): 30 min  Charges: OT General Charges $OT Visit: 1 Visit OT Treatments $Self Care/Home Management : 23-37 mins  Jeni Salles, MPH, MS, OTR/L ascom [phone] 10/18/18, 4:35 PM

## 2018-10-19 DIAGNOSIS — F332 Major depressive disorder, recurrent severe without psychotic features: Secondary | ICD-10-CM

## 2018-10-19 LAB — URINALYSIS, ROUTINE W REFLEX MICROSCOPIC
BILIRUBIN URINE: NEGATIVE
Bacteria, UA: NONE SEEN
Glucose, UA: NEGATIVE mg/dL
Hgb urine dipstick: NEGATIVE
Ketones, ur: NEGATIVE mg/dL
NITRITE: NEGATIVE
Protein, ur: NEGATIVE mg/dL
Specific Gravity, Urine: 1.014 (ref 1.005–1.030)
pH: 6 (ref 5.0–8.0)

## 2018-10-19 LAB — GLUCOSE, CAPILLARY
Glucose-Capillary: 106 mg/dL — ABNORMAL HIGH (ref 70–99)
Glucose-Capillary: 115 mg/dL — ABNORMAL HIGH (ref 70–99)
Glucose-Capillary: 116 mg/dL — ABNORMAL HIGH (ref 70–99)
Glucose-Capillary: 124 mg/dL — ABNORMAL HIGH (ref 70–99)

## 2018-10-19 MED ORDER — ARIPIPRAZOLE 2 MG PO TABS
2.0000 mg | ORAL_TABLET | Freq: Every day | ORAL | Status: DC
  Filled 2018-10-19: qty 1

## 2018-10-19 MED ORDER — VENLAFAXINE HCL 37.5 MG PO TABS
75.0000 mg | ORAL_TABLET | Freq: Two times a day (BID) | ORAL | Status: DC
  Administered 2018-10-20 – 2018-10-24 (×10): 75 mg via ORAL
  Filled 2018-10-19 (×10): qty 2

## 2018-10-19 MED ORDER — ARIPIPRAZOLE 2 MG PO TABS
2.0000 mg | ORAL_TABLET | Freq: Every day | ORAL | Status: DC
  Administered 2018-10-19 – 2018-10-23 (×5): 2 mg via ORAL
  Filled 2018-10-19 (×6): qty 1

## 2018-10-19 MED ORDER — TRACE MINERALS CR-CU-MN-SE-ZN 10-1000-500-60 MCG/ML IV SOLN
INTRAVENOUS | Status: AC
  Administered 2018-10-19: 19:00:00 via INTRAVENOUS
  Filled 2018-10-19: qty 1992

## 2018-10-19 MED ORDER — OCTREOTIDE ACETATE 100 MCG/ML IJ SOLN
50.0000 ug | Freq: Every day | INTRAMUSCULAR | Status: DC
  Administered 2018-10-19 – 2018-10-21 (×3): 50 ug via INTRAVENOUS
  Filled 2018-10-19 (×4): qty 0.5

## 2018-10-19 MED ORDER — FAT EMULSION PLANT BASED 20 % IV EMUL
250.0000 mL | INTRAVENOUS | Status: AC
  Administered 2018-10-19: 250 mL via INTRAVENOUS
  Filled 2018-10-19: qty 250

## 2018-10-19 NOTE — Consult Note (Signed)
[place] [facility] Consult   Reason for Consult: Consult for 73 year old woman in the hospital with severe surgical problems.  Concern about her depression Referring Physician:  Lysle Pearl Patient Identification: Cassie Carlson MRN:  [id number] Principal Diagnosis: Moderately severe recurrent major depression ([place]) Diagnosis:  Principal Problem:   Moderately severe recurrent major depression ([place]) Active Problems:   Enterocutaneous fistula   Protein-calorie malnutrition, severe   Total Time spent with patient: 1 hour  Subjective:   Cassie Carlson is a 73 y.o. female patient admitted with "it has been bad".  HPI: Patient seen chart reviewed.  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  Surgery notices that the patient appears to be getting worse with her depression.  Patient acknowledges to me that her mood feels depressed and down much of the time.  She is struggling to feel hopeful and optimistic.  She is trying her best to keep up her energy but sometimes just feels too fatigued and overwhelmed.  Admits that at times she wants to just give up but denies any intention or thought of hurting herself.  Patient is able to articulate positive things in her life she is looking forward to but is unsure whether she will ever be able to get back to them.  No sign of psychosis but does seem to be a little more depressed.  Major life stresses from her medical issues and feeling a little isolated at home.  Social history: Daughter is visiting but the daughter is from [place].  Patient had been living independently at home.  She is worried about how she will function outside the hospital.  Substance abuse history: No history of alcohol or drug abuse  Medical history: Enterocutaneous fistula currently with malnutrition  Past Psychiatric  History: Patient has been treated for depression and anxiety for years.  Sees Dr. Toy Care in [place].  Has been on venlafaxine for years at 150 mg with good response.  No history of hospitalization no history of suicide attempt.  Minimal other medication changes.  She did want to try going up to 225 mg of Effexor but it made it feel more sleepy.  Risk to Self:   Risk to Others:   Prior Inpatient Therapy:   Prior Outpatient Therapy:    Past Medical History:  Past Medical History:  Diagnosis Date  . Anxiety   . Arthritis   . Dementia ([place])   . Depression   . Family history of adverse reaction to anesthesia    "siister had issues with heart racing during lung biopsy"  . Family history of breast cancer   . Family history of colon cancer   . Family history of colon cancer   . Family history of prostate cancer   . Family history of prostate cancer   . Fibroids    excessive vaginal bleeding  . GERD (gastroesophageal reflux disease)   . Headache   . Hypothyroidism   . PONV (postoperative nausea and vomiting)   . Sleep apnea     Past Surgical History:  Procedure Laterality Date  . ABDOMINAL HYSTERECTOMY    . ANTERIOR CERVICAL DECOMP/DISCECTOMY FUSION N/A 01/01/2015   Procedure: ANTERIOR CERVICAL DECOMPRESSION/DISCECTOMY FUSION CERVICAL FIVE-SIX,CERVICAL SIX-SEVEN;  Surgeon: Karie Chimera, MD;  Location: [facility];  Service: Neurosurgery;  Laterality: N/A;  . AUGMENTATION MAMMAPLASTY Bilateral 1990  . BREAST  SURGERY    . CHOLECYSTECTOMY    . COLON RESECTION N/A 08/22/2018   Procedure: COLON RESECTION LAPAROSCOPIC;  Surgeon: Benjamine Sprague, DO;  Location: [facility] ORS;  Service: General;  Laterality: N/A;  . COLONOSCOPY WITH PROPOFOL N/A 06/14/2018   Procedure: COLONOSCOPY WITH PROPOFOL;  Surgeon: Lollie Sails, MD;  Location: [facility] ENDOSCOPY;  Service: Endoscopy;  Laterality: N/A;  . DILATION AND CURETTAGE OF UTERUS    . ESOPHAGOGASTRODUODENOSCOPY (EGD) WITH PROPOFOL N/A 06/14/2018    Procedure: ESOPHAGOGASTRODUODENOSCOPY (EGD) WITH PROPOFOL;  Surgeon: Lollie Sails, MD;  Location: [facility] ENDOSCOPY;  Service: Endoscopy;  Laterality: N/A;  . EUS  08/18/2012   Procedure: UPPER ENDOSCOPIC ULTRASOUND (EUS) LINEAR;  Surgeon: Milus Banister, MD;  Location: [facility] ENDOSCOPY;  Service: Endoscopy;  Laterality: N/A;  . ILEOSTOMY N/A 09/06/2018   Procedure: ILEOSTOMY;  Surgeon: Benjamine Sprague, DO;  Location: [facility] ORS;  Service: General;  Laterality: N/A;  . LAPAROTOMY N/A 09/06/2018   Procedure: EXPLORATORY LAPAROTOMY;  Surgeon: Benjamine Sprague, DO;  Location: [facility] ORS;  Service: General;  Laterality: N/A;  . LAPAROTOMY N/A 09/03/2018   Procedure: EXPLORATORY LAPAROTOMY;  Surgeon: Benjamine Sprague, DO;  Location: [facility] ORS;  Service: General;  Laterality: N/A;  . REDUCTION MAMMAPLASTY Bilateral 1990  . SHOULDER ARTHROSCOPY Left   . VEIN LIGATION AND STRIPPING     Family History:  Family History  Problem Relation Age of Onset  . Breast cancer Mother 92  . Lung cancer Mother   . Prostate cancer Father        dx 46's  . Bladder Cancer Father   . Colon cancer Father 54  . Throat cancer Sister   . Lung cancer Sister   . Lung cancer Brother        agent orange, hx of smoking  . Brain cancer Brother   . Leukemia Paternal Aunt   . Prostate cancer Paternal Grandfather 29  . Lymphoma Other 35   Family Psychiatric  History: Positive for alcohol abuse Social History:  Social History   Substance and Sexual Activity  Alcohol Use No     Social History   Substance and Sexual Activity  Drug Use Never    Social History   Socioeconomic History  . Marital status: Divorced    Spouse name: Not on file  . Number of children: 2  . Years of education: Not on file  . Highest education level: Not on file  Occupational History  . Not on file  Social Needs  . Financial resource strain: Not on file  . Food insecurity:    Worry: Not on file    Inability: Not on file  . Transportation needs:     Medical: Not on file    Non-medical: Not on file  Tobacco Use  . Smoking status: Former Smoker    Packs/day: 2.00    Years: 5.00    Pack years: 10.00    Types: Cigarettes    Last attempt to quit: 10/19/1965    Years since quitting: 53.0  . Smokeless tobacco: Never Used  Substance and Sexual Activity  . Alcohol use: No  . Drug use: Never  . Sexual activity: Not on file  Lifestyle  . Physical activity:    Days per week: Not on file    Minutes per session: Not on file  . Stress: Not on file  Relationships  . Social connections:    Talks on phone: Not on file    Gets together: Not on file  Attends religious service: Not on file    Active member of club or organization: Not on file    Attends meetings of clubs or organizations: Not on file    Relationship status: Not on file  Other Topics Concern  . Not on file  Social History Narrative  . Not on file   Additional Social History:    Allergies:   Allergies  Allergen Reactions  . Asa [Aspirin] Other (See Comments)    GI distress/irritaion  . Codeine Other (See Comments)    "Face turned red like a sunburn"  . Ibuprofen Other (See Comments)    GI distress/irritation  . Nsaids Other (See Comments)    GI distress/irritation  . Other Itching    CHG wipes    Labs:  Results for orders placed or performed during the hospital encounter of 10/04/18 (from the past 48 hour(s))  Urinalysis, Routine w reflex microscopic     Status: Abnormal   Collection Time: 10/17/18  6:37 PM  Result Value Ref Range   Color, Urine YELLOW (A) YELLOW   APPearance CLEAR (A) CLEAR   Specific Gravity, Urine 1.014 1.005 - 1.030   pH 6.0 5.0 - 8.0   Glucose, UA NEGATIVE NEGATIVE mg/dL   Hgb urine dipstick NEGATIVE NEGATIVE   Bilirubin Urine NEGATIVE NEGATIVE   Ketones, ur NEGATIVE NEGATIVE mg/dL   Protein, ur NEGATIVE NEGATIVE mg/dL   Nitrite NEGATIVE NEGATIVE   Leukocytes, UA TRACE (A) NEGATIVE   RBC / HPF 0-5 0 - 5 RBC/hpf   WBC, UA  6-10 0 - 5 WBC/hpf   Bacteria, UA NONE SEEN NONE SEEN   Squamous Epithelial / LPF 0-5 0 - 5   Ca Oxalate Crys, UA PRESENT     Comment: Performed at [facility], [address] [place], [place] [address]  Glucose, capillary     Status: Abnormal   Collection Time: 10/18/18 12:07 AM  Result Value Ref Range   Glucose-Capillary 115 (H) 70 - 99 mg/dL  Glucose, capillary     Status: Abnormal   Collection Time: 10/18/18  5:39 AM  Result Value Ref Range   Glucose-Capillary 113 (H) 70 - 99 mg/dL  Glucose, capillary     Status: Abnormal   Collection Time: 10/18/18  4:58 PM  Result Value Ref Range   Glucose-Capillary 107 (H) 70 - 99 mg/dL  Glucose, capillary     Status: Abnormal   Collection Time: 10/19/18 12:40 AM  Result Value Ref Range   Glucose-Capillary 115 (H) 70 - 99 mg/dL   Comment 1 Notify RN   Glucose, capillary     Status: Abnormal   Collection Time: 10/19/18  5:47 AM  Result Value Ref Range   Glucose-Capillary 116 (H) 70 - 99 mg/dL   Comment 1 Notify RN   Glucose, capillary     Status: Abnormal   Collection Time: 10/19/18 12:10 PM  Result Value Ref Range   Glucose-Capillary 124 (H) 70 - 99 mg/dL   Comment 1 Notify RN   Glucose, capillary     Status: Abnormal   Collection Time: 10/19/18  5:17 PM  Result Value Ref Range   Glucose-Capillary 106 (H) 70 - 99 mg/dL   Comment 1 Notify RN     Current Facility-Administered Medications  Medication Dose Route Frequency Provider Last Rate Last Dose  . acetaminophen (TYLENOL) tablet 1,000 mg  1,000 mg Oral Q6H Sakai, Isami, DO   1,000 mg at 10/19/18 1218  . ARIPiprazole (ABILIFY) tablet 2 mg  2 mg Oral Daily Jacquan Savas T, MD      . cholestyramine Lucrezia Starch) packet 4 g  4 g Oral TID Lysle Pearl, Isami, DO   4 g at 10/19/18 1604  . enoxaparin (LOVENOX) injection 40 mg  40 mg Subcutaneous Q24H Sakai, Isami, DO   40 mg at 10/18/18 2146  . TPN (CLINIMIX-E) Adult   Intravenous Continuous TPN Shari Prows, RPH       And   . Fat emulsion 20 % infusion 250 mL  250 mL Intravenous Continuous TPN Shari Prows, RPH      . insulin aspart (novoLOG) injection 0-9 Units  0-9 Units Subcutaneous Q6H Dallie Piles, RPH   1 Units at 10/19/18 1818  . levothyroxine (SYNTHROID, LEVOTHROID) tablet 25 mcg  25 mcg Oral Q0600 Sakai, Isami, DO   25 mcg at 10/19/18 3810  . loperamide (IMODIUM) capsule 4 mg  4 mg Oral TID Lysle Pearl, Isami, DO   4 mg at 10/19/18 1604  . octreotide (SANDOSTATIN) injection 50 mcg  50 mcg Intravenous Daily Sakai, Isami, DO   50 mcg at 10/19/18 1817  . ondansetron (ZOFRAN-ODT) disintegrating tablet 4 mg  4 mg Oral Q6H PRN Lysle Pearl, Isami, DO   4 mg at 10/18/18 1102   Or  . ondansetron (ZOFRAN) injection 4 mg  4 mg Intravenous Q6H PRN Sakai, Isami, DO   4 mg at 10/19/18 1604  . pantoprazole (PROTONIX) injection 40 mg  40 mg Intravenous Q12H Pabon, Bea Graff F, MD   40 mg at 10/19/18 0952  . sodium chloride flush (NS) 0.9 % injection 10-40 mL  10-40 mL Intracatheter Q12H Sakai, Isami, DO   10 mL at 10/19/18 0954  . sodium chloride flush (NS) 0.9 % injection 10-40 mL  10-40 mL Intracatheter PRN Lysle Pearl, Isami, DO      . traMADol (ULTRAM) tablet 50 mg  50 mg Oral Q6H PRN Sakai, Isami, DO   50 mg at 10/06/18 1452  . venlafaxine XR (EFFEXOR-XR) 24 hr capsule 150 mg  150 mg Oral Q breakfast Sakai, Isami, DO   150 mg at 10/19/18 1751    Musculoskeletal: Strength & Muscle Tone: decreased Gait & Station: normal Patient leans: N/A  Psychiatric Specialty Exam: Physical Exam  Nursing note and vitals reviewed. Constitutional: She appears well-developed.  HENT:  Head: Normocephalic and atraumatic.  Eyes: Pupils are equal, round, and reactive to light. Conjunctivae are normal.  Neck: Normal range of motion.  Cardiovascular: Normal heart sounds.  Respiratory: Effort normal.  GI:  Patient has a fistula with drainage  Musculoskeletal: Normal range of motion.  Neurological: She is alert.  Skin: Skin is warm and  dry.  Psychiatric: Judgment normal. Her speech is delayed. She is slowed. She is not agitated and not aggressive. Thought content is not paranoid. Cognition and memory are normal. She exhibits a depressed mood. She expresses suicidal ideation. She expresses no homicidal ideation. She expresses no suicidal plans.    Review of Systems  Constitutional: Negative.   HENT: Negative.   Eyes: Negative.   Respiratory: Negative.   Cardiovascular: Negative.   Gastrointestinal: Positive for abdominal pain.  Musculoskeletal: Negative.   Skin: Negative.   Neurological: Negative.   Psychiatric/Behavioral: Positive for depression and suicidal ideas. Negative for hallucinations, memory loss and substance abuse. The patient is nervous/anxious and has insomnia.     Blood pressure 118/65, pulse 85, temperature 98.6 F (37 C), temperature source Oral, resp. rate 16, height 5\' 3"  (1.6 m), weight 74.6 kg,  SpO2 99 %.Body mass index is 29.13 kg/m.  General Appearance: Casual  Eye Contact:  Good  Speech:  Clear and Coherent  Volume:  Normal  Mood:  Depressed  Affect:  Congruent  Thought Process:  Goal Directed  Orientation:  Full (Time, Place, and Person)  Thought Content:  Logical  Suicidal Thoughts:  Yes.  without intent/plan  Homicidal Thoughts:  No  Memory:  Immediate;   Fair Recent;   Fair Remote;   Fair  Judgement:  Good  Insight:  Good  Psychomotor Activity:  Decreased  Concentration:  Concentration: Fair  Recall:  [employer] of Knowledge:  Fair  Language:  Fair  Akathisia:  No  Handed:  Right  AIMS (if indicated):     Assets:  Communication Skills Desire for Improvement Housing Resilience Social Support  ADL's:  Impaired  Cognition:  WNL  Sleep:        Treatment Plan Summary: Daily contact with patient to assess and evaluate symptoms and progress in treatment, Medication management and Plan Talk with the patient for a while about various options to try and address depression.  I  recommend that we try doing something as the risk is relatively low.  I made 2 suggestions.  First of all I would recommend that we try adding Abilify starting at a very low dose to see if that boost the effect of her antidepressant.  Second of all there is some chance that extended release medicines may not be completely absorbed in a person with her kind of gut problems.. Therefore I am going to suggest that we change to venlafaxine instant release to be given twice a day instead.  I will follow as needed.  Disposition: No evidence of imminent risk to self or others at present.   Patient does not meet criteria for psychiatric inpatient admission. Supportive therapy provided about ongoing stressors.  Alethia Berthold, MD 10/19/2018 6:22 PM

## 2018-10-19 NOTE — Consult Note (Signed)
PHARMACY - ADULT TOTAL PARENTERAL NUTRITION CONSULT NOTE   Pharmacy Consult for TPN management Indication: malnutrition  Patient Measurements: Height: 5\' 3"  (160 cm) Weight: 164 lb 7.4 oz (74.6 kg) IBW/kg (Calculated) : 52.4 TPN AdjBW (KG): 77 Body mass index is 29.13 kg/m.  Assessment: 73 y/o female with h/o transverse colectomy for unresectable colon polyp on 08/23/18 recent admission for incisional abscess and wound dehiscence requiring exploratory laparotomy incisional abscess washout, primary closure of anastomosis leak, placement of phasix mesh 11/17; pt now admitted for abdominal pain, stool in JP drain and wound dehiscence   This patient had a recent admission and received TPN from 11/22-12/3 and tolerated TPN well. Pt reported that her appetite and oral intake had been severely decreased over the previous week. She had lost ~15-20lb(9%) wt loss over the past 3 weeks.  Suspect pt at high refeeding risk; will monitor electrolytes closely.   GI: triglycerides have increased above normal limits but still less than 200. A follow-up level is scheduled Endo: sensitive SSI q6h - Insulin requirements in the past 24 hours: 0 unit BG over past 24 hours 107-116 Lytes: WNL  Renal: Scr<1  ID: [id number]  TPN Access: 12/18 TPN start date: 12/18 Nutritional Goals (per RD recommendation on 12/18): Regimen @ goal rate provides 1894kcal/day, 100g/day protein, 225ml volume   Goal TPN rate is 83 ml/hr (provides 90% of their needs)  Current Nutrition: NPO except sips with meds + TPN  Plan: 10/18/18 Continue TPN today. Patient is at goal and will remain on the same rate/formulation. The plan is for patient to return home with home health, TPN, and private duty nursing  Continue Clinimix E 5/15 TPN at goal rate of 83 mL/hr + 20% lipids at 36mL/hr Additional Electrolytes in TPN: none Add MVI, trace elements, vitC 500 mg Continue q6h CBGS and sensitive SSI and adjust as needed  F/U electrolytes  on Thursday 10/20/2018  Forrest Moron, PharmD Clinical Pharmacist 10/19/2018 12:23 PM

## 2018-10-20 DIAGNOSIS — F332 Major depressive disorder, recurrent severe without psychotic features: Secondary | ICD-10-CM

## 2018-10-20 LAB — COMPREHENSIVE METABOLIC PANEL
ALBUMIN: 2.6 g/dL — AB (ref 3.5–5.0)
ALT: 33 U/L (ref 0–44)
AST: 38 U/L (ref 15–41)
Alkaline Phosphatase: 79 U/L (ref 38–126)
Anion gap: 5 (ref 5–15)
BUN: 29 mg/dL — ABNORMAL HIGH (ref 8–23)
CO2: 25 mmol/L (ref 22–32)
Calcium: 8.4 mg/dL — ABNORMAL LOW (ref 8.9–10.3)
Chloride: 107 mmol/L (ref 98–111)
Creatinine, Ser: 0.69 mg/dL (ref 0.44–1.00)
GFR calc Af Amer: >60 mL/min (ref >60)
GFR calc non Af Amer: >60 mL/min (ref >60)
GLUCOSE: 120 mg/dL — AB (ref 70–99)
Potassium: 4.1 mmol/L (ref 3.5–5.1)
Sodium: 137 mmol/L (ref 135–145)
Total Bilirubin: 0.4 mg/dL (ref 0.3–1.2)
Total Protein: 5.6 g/dL — ABNORMAL LOW (ref 6.5–8.1)

## 2018-10-20 LAB — GLUCOSE, CAPILLARY
Glucose-Capillary: 120 mg/dL — ABNORMAL HIGH (ref 70–99)
Glucose-Capillary: 122 mg/dL — ABNORMAL HIGH (ref 70–99)
Glucose-Capillary: 126 mg/dL — ABNORMAL HIGH (ref 70–99)
Glucose-Capillary: 128 mg/dL — ABNORMAL HIGH (ref 70–99)

## 2018-10-20 LAB — PHOSPHORUS: Phosphorus: 5.1 mg/dL — ABNORMAL HIGH (ref 2.5–4.6)

## 2018-10-20 LAB — MAGNESIUM: Magnesium: 1.9 mg/dL (ref 1.7–2.4)

## 2018-10-20 MED ORDER — FAT EMULSION PLANT BASED 20 % IV EMUL
250.0000 mL | INTRAVENOUS | Status: AC
  Administered 2018-10-20: 250 mL via INTRAVENOUS
  Filled 2018-10-20: qty 250

## 2018-10-20 MED ORDER — TRACE MINERALS CR-CU-MN-SE-ZN 10-1000-500-60 MCG/ML IV SOLN
INTRAVENOUS | Status: AC
  Administered 2018-10-20: 17:00:00 via INTRAVENOUS
  Filled 2018-10-20: qty 1992

## 2018-10-20 MED ORDER — PROMETHAZINE HCL 25 MG/ML IJ SOLN: 12.5000 mg | Freq: Once | INTRAMUSCULAR | Status: DC

## 2018-10-20 NOTE — Progress Notes (Addendum)
Subjective:  CC:  Cassie Carlson is a 73 y.o. female  Hospital stay day 4,    08/22/18  lap converted to open transverse colectomy for unresectable colon polyp.  09/03/18 exlap, wound washout, Phasix mesh placement over dehiscence from above.   09/06/18 Noted to have increased feculant drainage so returned to OR for wound washout, phasix mesh removal, and loop ileostomy creation  10/05/18 Now noted to have new small bowel fistula within the midline wound granulation bed, with high output  Now admitted for [facility] fistula care  HPI: Bag remained intact overnight just to gravity.  Draining well per patient report.  Met with psychiatrist yesterday.  Still hesitant on going home.   ROS:  A 5 point review of systems was performed and pertinent positives and negatives noted in HPI.   Objective:      Temp:  [97.7 F (36.5 C)-98.8 F (37.1 C)] 97.7 F (36.5 C) (01/02 1345) Pulse Rate:  [71-79] 79 (01/02 1345) Resp:  [18-20] 18 (01/02 1345) BP: (108-126)/(54-65) 126/65 (01/02 1345) SpO2:  [96 %-99 %] 96 % (01/02 1345) Weight:  [76.2 kg] 76.2 kg (01/02 0500)     Height: 5' 3" (160 cm) Weight: 76.2 kg BMI (Calculated): 29.77   Intake/Output this shift:   Intake/Output Summary (Last 24 hours) at 10/20/2018 1433 Last data filed at 10/20/2018 1124 Gross per 24 hour  Intake 1266.83 ml  Output 2075 ml  Net -808.17 ml   913m/24hr of fistula output  Constitutional :  alert, cooperative, appears stated age and no distress  Respiratory:  clear to auscultation bilaterally  Cardiovascular:  regular rate and rhythm  Gastrointestinal: soft, non-tender; bowel sounds normal; no masses,  no organomegaly. Midline wound covered in Eakin pouch and ileostomy pouch intact.  Midline wound has thick granulation tissue wound around the edges that is slowly closing in space.  The open space measures approx 5cm x 4 cm x 1cm deep now.  Output is yellow tinged secondary to cholestyramine, draining well into  catheter bag.  The former blake drain site closed now.  Ileostomy remains patent with scant output, mucosa pink.   Skin: Cool and moist.   Psychiatric: Normal affect, non-agitated, not confused       LABS:  CMP Latest Ref Rng & Units 10/20/2018 10/17/2018 10/13/2018  Glucose 70 - 99 mg/dL 120(H) 94 129(H)  BUN 8 - 23 mg/dL 29(H) 29(H) 33(H)  Creatinine 0.44 - 1.00 mg/dL 0.69 0.77 0.72  Sodium 135 - 145 mmol/L 137 137 138  Potassium 3.5 - 5.1 mmol/L 4.1 4.0 4.3  Chloride 98 - 111 mmol/L 107 104 105  CO2 22 - 32 mmol/L _0 Calcium 8.9 - 10.3 mg/dL 8.4(L) 8.6(L) 8.6(L)  Total Protein 6.5 - 8.1 g/dL 5.6(L) 5.6(L) 5.8(L)  Total Bilirubin 0.3 - 1.2 mg/dL 0.4 0.4 0.4  Alkaline Phos 38 - 126 U/L 79 75 77  AST 15 - 41 U/L 38 40 49(H)  ALT 0 - 44 U/L 33 35 41   CBC Latest Ref Rng & Units 10/17/2018 10/10/2018 10/09/2018  WBC 4.0 - 10.5 K/uL 4.6 6.8 5.9  Hemoglobin 12.0 - 15.0 g/dL 9.3(L) 10.0(L) 10.3(L)  Hematocrit 36.0 - 46.0 % 28.9(L) 31.8(L) 32.4(L)  Platelets 150 - 400 K/uL 281 267 276    RADS: n/a Assessment:    08/22/18  lap converted to open transverse colectomy for unresectable colon polyp.  09/03/18 exlap, wound washout, Phasix mesh placement over dehiscence from above.   09/06/18 Noted  to have increased feculant drainage so returned to OR for wound washout, phasix mesh removal, and loop ileostomy creation  10/05/18 Now noted to have new small bowel fistula within the midline wound granulation bed, with high output  Now admitted for [facility] fistula care  Will decrease octreotide to 8mg daily from 1063m, secondary to worsening nausea with administration.  Continue cholysteramine.     Continue   NPO, TPN and local wound care.  At this point, there is no additional medical interventions that can be given for the time being to further decrease output.  In addition, we have slowly been transitioning the wound care and Eakin pouch maintenance over to the patient, in  preparation for discharge.  Once daily TPN infusion is able to be setup, and patient is able to be sent home with ostomy/pouch supplies, she can be d/c'd and f/u on an outpt basis.  I once again reiterated to patientt to take initiative and observe how the TPN is setup in preparation for d/c, as well as fix any leaks from the pouch that may develop herself while she is still inhouse to become comfortable with the overall procedure. Plan is to do a full pouch change with patient to make sure she is comfortable.  D/c will have to be postponed today due to increase output recorded past 24hrs.  Possible reason is start of new anti-depressents that has diarrhea as side effect?  Will need to observe the next 24hrs to see if this is going to potentially be a chronic issue since dehydration will be likely outcome if output remains this high as an outpt.

## 2018-10-20 NOTE — Consult Note (Signed)
PHARMACY - ADULT TOTAL PARENTERAL NUTRITION CONSULT NOTE   Pharmacy Consult for TPN management Indication: malnutrition  Patient Measurements: Height: 5\' 3"  (160 cm) Weight: 167 lb 15.9 oz (76.2 kg) IBW/kg (Calculated) : 52.4 TPN AdjBW (KG): 77 Body mass index is 29.76 kg/m.  Assessment: 73 y/o female with h/o transverse colectomy for unresectable colon polyp on 08/23/18 recent admission for incisional abscess and wound dehiscence requiring exploratory laparotomy incisional abscess washout, primary closure of anastomosis leak, placement of phasix mesh 11/17; pt now admitted for abdominal pain, stool in JP drain and wound dehiscence   This patient had a recent admission and received TPN from 11/22-12/3 and tolerated TPN well. Pt reported that her appetite and oral intake had been severely decreased over the previous week. She had lost ~15-20lb(9%) wt loss over the past 3 weeks prior to this admission.  GI: triglycerides have increased above normal limits but still less than 200. A follow-up level is scheduled Endo: sensitive SSI q6h - Insulin requirements in the past 24 hours: 4 units BG over past 24 hours 106-128  Lytes: WNL except phosphorous Renal: Scr<1  ID: [id number]  TPN Access: 12/18 TPN start date: 12/18 Nutritional Goals (per RD recommendation on 12/18): Regimen @ goal rate provides 1894kcal/day, 100g/day protein, 2245ml volume   Goal TPN rate is 83 ml/hr (provides 90% of their needs)  Current Nutrition: NPO except sips with meds + TPN  Plan: 10/20/18 Continue TPN today. Patient is at goal and will remain on the same rate/formulation. The plan is for patient to return home with home health, TPN, and private duty nursing  Continue Clinimix E 5/15 TPN at goal rate of 83 mL/hr + 20% lipids at 94mL/hr Additional Electrolytes in TPN: none Add MVI, trace elements, vitC 500 mg Continue q6h CBGS and sensitive SSI and adjust as needed  F/U electrolytes, specifically  phosphorous, tomorrow 10/21/2018  Dallie Piles, PharmD Clinical Pharmacist 10/20/2018 12:14 PM

## 2018-10-20 NOTE — Progress Notes (Signed)
Physical Therapy Treatment Patient Details Name: Cassie Carlson MRN: [id number] DOB: [birth year] Today's Date: 10/20/2018    History of Present Illness Pt. is a 73 y/o female with h/o transverse colectomy for unresectable colon polyp on 08/23/18 recent admission for incisional abscess and wound dehiscence requiring exploratory laparotomy incisional abscess washout, primary closure of anastomosis leak, placement of phasix mesh 11/17; pt now admitted for abdominal pain, stool in JP drain and wound dehiscence.     PT Comments    Stood and was able to ambulate x  2 around unit without AD and min guard/supervision.  Slow steady gait with no LOB.  More confident today with mobility.  She was able to increase her exercises today to 2 x 10 reps.  Overall fatigued but tolerated well and was pleased with her progress.  Encouraged continued HEP in sitting and supine along with requesting nursing to walk daily as time allows.    Follow Up Recommendations  SNF     Equipment Recommendations       Recommendations for Other Services       Precautions / Restrictions Precautions Precautions: Fall Precaution Comments: abdominal incision with wound suction, colostomy; Restrictions Weight Bearing Restrictions: No    Mobility  Bed Mobility               General bed mobility comments: deferred up in chair  Transfers Overall transfer level: Modified independent Equipment used: None Transfers: Sit to/from Stand Sit to Stand: Supervision Stand pivot transfers: Min guard          Ambulation/Gait Ambulation/Gait assistance: Min guard Gait Distance (Feet): 330 Feet Assistive device: None Gait Pattern/deviations: Step-through pattern Gait velocity: decreased   General Gait Details: pt slow gait but more comfortable withotu AD   Stairs             Wheelchair Mobility    Modified Rankin (Stroke Patients Only)       Balance Overall balance assessment: Needs  assistance Sitting-balance support: Feet supported Sitting balance-Leahy Scale: Good     Standing balance support: No upper extremity supported Standing balance-Leahy Scale: Fair Standing balance comment: slow but steady without AD                            Cognition Arousal/Alertness: Awake/alert Behavior During Therapy: WFL for tasks assessed/performed Overall Cognitive Status: Within Functional Limits for tasks assessed                                        Exercises Other Exercises Other Exercises: standing marches, heel raises, ab/add, SLR  2 x 10     General Comments        Pertinent Vitals/Pain Pain Assessment: Faces Faces Pain Scale: Hurts a little bit Pain Location: Abdomen Pain Descriptors / Indicators: Aching;Guarding Pain Intervention(s): Monitored during session    Home Living                      Prior Function            PT Goals (current goals can now be found in the care plan section) Progress towards PT goals: Progressing toward goals    Frequency    Min 2X/week      PT Plan Current plan remains appropriate    Co-evaluation  AM-PAC PT "6 Clicks" Mobility   Outcome Measure  Help needed turning from your back to your side while in a flat bed without using bedrails?: None Help needed moving from lying on your back to sitting on the side of a flat bed without using bedrails?: None Help needed moving to and from a bed to a chair (including a wheelchair)?: None Help needed standing up from a chair using your arms (e.g., wheelchair or bedside chair)?: None Help needed to walk in hospital room?: A Little Help needed climbing 3-5 steps with a railing? : A Little 6 Click Score: 22    End of Session Equipment Utilized During Treatment: Gait belt Activity Tolerance: Patient tolerated treatment well Patient left: in chair;with call bell/phone within reach;with family/visitor present          Time: 1240-1303 PT Time Calculation (min) (ACUTE ONLY): 23 min  Charges:  $Gait Training: 8-22 mins $Therapeutic Exercise: 8-22 mins                     Chesley Noon, PTA 10/20/18, 1:14 PM

## 2018-10-20 NOTE — Progress Notes (Signed)
Pt was able to change her pouch today with assisted of her daughter. RN was present while they were doing it. RN provided minimal assistance, pt and daughter were able to do most of the process. Pt was uncomfortable with the smell of the pouch at the beginning, but she was able to remove the pouch by herself. Daughter assisted when the pt had to place the pouch back in place because pt was not able to see. RN recommended pt to stand in a front of a mirror when she has to do it at home, so she will have a better view. Will continue to monitor pt for any leaks.

## 2018-10-20 NOTE — Progress Notes (Signed)
[facility] MD Progress Note  10/20/2018 3:00 PM Cassie Carlson  MRN:  [id number]  Principal Problem: Moderately severe recurrent major depression ([place]) Diagnosis: Principal Problem:   Moderately severe recurrent major depression ([place]) Active Problems:   Enterocutaneous fistula   Protein-calorie malnutrition, severe  Total Time spent with patient: 40 min  Patient seen chart reviewed.   Past Psychiatric History: Cassie Carlson is a 73 y.o. female patient admitted for enterocutaneous fistula.    Psychiatry is following for worsening depression and anxiety.  Patient is seen with daughter at bedside.   On evaluation today, patient reports she has tolerated change in medication to Effexor immediate release 75 mg twice a day and addition of Abilify 2 mg at bedtime.  She denies any change in mood symptoms at this time.  Patient endorses poor sleep, poor concentration, poor energy.  Patient states she is unable to sustain sleep greater than 2 hours at a time.  She does note a history of sleep apnea, but refuses to retry CPAP at this time.  "I cannot distract myself down with one more thing."  Patient expresses anxiety about discharge and plans for care after discharge.  Patient specifically denies any suicidal ideation, homicidal ideation, or auditory and visual hallucinations.  She does not display psychotic features.   Social history: Daughter is visiting but the daughter is from [place].  Patient had been living independently at home.  She is worried about how she will function outside the hospital.  Substance abuse history: No history of alcohol or drug abuse  Medical history: Enterocutaneous fistula currently with malnutrition  Past Psychiatric History: Patient has been treated for depression and anxiety for years.  Sees Dr. Toy Care in [place].  Has been on venlafaxine for years at 150 mg with good response.  No history of hospitalization no history of suicide attempt.  Minimal other  medication changes.  She did try going up to 225 mg of Effexor but it made it feel more sleepy.  Past Medical History:  Past Medical History:  Diagnosis Date  . Anxiety   . Arthritis   . Dementia ([place])   . Depression   . Family history of adverse reaction to anesthesia    "siister had issues with heart racing during lung biopsy"  . Family history of breast cancer   . Family history of colon cancer   . Family history of colon cancer   . Family history of prostate cancer   . Family history of prostate cancer   . Fibroids    excessive vaginal bleeding  . GERD (gastroesophageal reflux disease)   . Headache   . Hypothyroidism   . PONV (postoperative nausea and vomiting)   . Sleep apnea     Past Surgical History:  Procedure Laterality Date  . ABDOMINAL HYSTERECTOMY    . ANTERIOR CERVICAL DECOMP/DISCECTOMY FUSION N/A 01/01/2015   Procedure: ANTERIOR CERVICAL DECOMPRESSION/DISCECTOMY FUSION CERVICAL FIVE-SIX,CERVICAL SIX-SEVEN;  Surgeon: Karie Chimera, MD;  Location: [facility];  Service: Neurosurgery;  Laterality: N/A;  . AUGMENTATION MAMMAPLASTY Bilateral 1990  . BREAST SURGERY    . CHOLECYSTECTOMY    . COLON RESECTION N/A 08/22/2018   Procedure: COLON RESECTION LAPAROSCOPIC;  Surgeon: Benjamine Sprague, DO;  Location: [facility] ORS;  Service: General;  Laterality: N/A;  . COLONOSCOPY WITH PROPOFOL N/A 06/14/2018   Procedure: COLONOSCOPY WITH PROPOFOL;  Surgeon: Lollie Sails, MD;  Location: [facility] ENDOSCOPY;  Service: Endoscopy;  Laterality: N/A;  . DILATION AND CURETTAGE OF UTERUS    .  ESOPHAGOGASTRODUODENOSCOPY (EGD) WITH PROPOFOL N/A 06/14/2018   Procedure: ESOPHAGOGASTRODUODENOSCOPY (EGD) WITH PROPOFOL;  Surgeon: Lollie Sails, MD;  Location: [facility] END[facility]COPY;  Service: Endoscopy;  Laterality: N/A;  . EUS  08/18/2012   Procedure: UPPER ENDOSCOPIC ULTRASOUND (EUS) LINEAR;  Surgeon: Milus Banister, MD;  Location: [facility] ENDOSCOPY;  Service: Endoscopy;  Laterality: N/A;  . ILEOSTOMY N/A  09/06/2018   Procedure: ILEOSTOMY;  Surgeon: Benjamine Sprague, DO;  Location: [facility] ORS;  Service: General;  Laterality: N/A;  . LAPAROTOMY N/A 09/06/2018   Procedure: EXPLORATORY LAPAROTOMY;  Surgeon: Benjamine Sprague, DO;  Location: [facility] ORS;  Service: General;  Laterality: N/A;  . LAPAROTOMY N/A 09/03/2018   Procedure: EXPLORATORY LAPAROTOMY;  Surgeon: Benjamine Sprague, DO;  Location: [facility] ORS;  Service: General;  Laterality: N/A;  . REDUCTION MAMMAPLASTY Bilateral 1990  . SHOULDER ARTHROSCOPY Left   . VEIN LIGATION AND STRIPPING     Family History:  Family History  Problem Relation Age of Onset  . Breast cancer Mother 12  . Lung cancer Mother   . Prostate cancer Father        dx 14's  . Bladder Cancer Father   . Colon cancer Father 43  . Throat cancer Sister   . Lung cancer Sister   . Lung cancer Brother        agent orange, hx of smoking  . Brain cancer Brother   . Leukemia Paternal Aunt   . Prostate cancer Paternal Grandfather 42  . Lymphoma Other 35   Family Psychiatric  History: Positive for alcohol abuse Social History:  Social History   Substance and Sexual Activity  Alcohol Use No     Social History   Substance and Sexual Activity  Drug Use Never    Social History   Socioeconomic History  . Marital status: Divorced    Spouse name: Not on file  . Number of children: 2  . Years of education: Not on file  . Highest education level: Not on file  Occupational History  . Not on file  Social Needs  . Financial resource strain: Not on file  . Food insecurity:    Worry: Not on file    Inability: Not on file  . Transportation needs:    Medical: Not on file    Non-medical: Not on file  Tobacco Use  . Smoking status: Former Smoker    Packs/day: 2.00    Years: 5.00    Pack years: 10.00    Types: Cigarettes    Last attempt to quit: 10/19/1965    Years since quitting: 53.0  . Smokeless tobacco: Never Used  Substance and Sexual Activity  . Alcohol use: No  . Drug use:  Never  . Sexual activity: Not on file  Lifestyle  . Physical activity:    Days per week: Not on file    Minutes per session: Not on file  . Stress: Not on file  Relationships  . Social connections:    Talks on phone: Not on file    Gets together: Not on file    Attends religious service: Not on file    Active member of club or organization: Not on file    Attends meetings of clubs or organizations: Not on file    Relationship status: Not on file  Other Topics Concern  . Not on file  Social History Narrative  . Not on file   Additional Social History:     Patient likely to need resources for medical care  as well as psychosocial therapy after discharge                    Sleep: Poor  Appetite:  Poor  Current Medications: Current Facility-Administered Medications  Medication Dose Route Frequency Provider Last Rate Last Dose  . acetaminophen (TYLENOL) tablet 1,000 mg  1,000 mg Oral Q6H Sakai, Isami, DO   1,000 mg at 10/19/18 1218  . ARIPiprazole (ABILIFY) tablet 2 mg  2 mg Oral Daily Sakai, Isami, DO   2 mg at 10/19/18 2221  . cholestyramine (QUESTRAN) packet 4 g  4 g Oral TID Lysle Pearl, Isami, DO   4 g at 10/20/18 0919  . enoxaparin (LOVENOX) injection 40 mg  40 mg Subcutaneous Q24H Sakai, Isami, DO   40 mg at 10/19/18 2043  . TPN (CLINIMIX-E) Adult   Intravenous Continuous TPN Dallie Piles, [facility]       And  . Fat emulsion 20 % infusion 250 mL  250 mL Intravenous Continuous TPN Dallie Piles, RPH      . insulin aspart (novoLOG) injection 0-9 Units  0-9 Units Subcutaneous Q6H Dallie Piles, RPH   1 Units at 10/20/18 0551  . levothyroxine (SYNTHROID, LEVOTHROID) tablet 25 mcg  25 mcg Oral Q0600 Sakai, Isami, DO   25 mcg at 10/20/18 0535  . loperamide (IMODIUM) capsule 4 mg  4 mg Oral TID Lysle Pearl, Isami, DO   4 mg at 10/20/18 9381  . octreotide (SANDOSTATIN) injection 50 mcg  50 mcg Intravenous Daily Sakai, Isami, DO   50 mcg at 10/20/18 1058  . ondansetron (ZOFRAN-ODT)  disintegrating tablet 4 mg  4 mg Oral Q6H PRN Lysle Pearl, Isami, DO   4 mg at 10/18/18 1102   Or  . ondansetron (ZOFRAN) injection 4 mg  4 mg Intravenous Q6H PRN Sakai, Isami, DO   4 mg at 10/20/18 0953  . pantoprazole (PROTONIX) injection 40 mg  40 mg Intravenous Q12H Pabon, Bea Graff F, MD   40 mg at 10/20/18 0918  . promethazine (PHENERGAN) injection 12.5 mg  12.5 mg Intravenous Once Sakai, Isami, DO      . sodium chloride flush (NS) 0.9 % injection 10-40 mL  10-40 mL Intracatheter Q12H Sakai, Isami, DO   10 mL at 10/20/18 0918  . sodium chloride flush (NS) 0.9 % injection 10-40 mL  10-40 mL Intracatheter PRN Lysle Pearl, Isami, DO      . TPN (CLINIMIX-E) Adult   Intravenous Continuous TPN Shari Prows, RPH 83 mL/hr at 10/20/18 0700    . traMADol (ULTRAM) tablet 50 mg  50 mg Oral Q6H PRN Sakai, Isami, DO   50 mg at 10/06/18 1452  . venlafaxine [facility]) tablet 75 mg  75 mg Oral BID WC Clapacs, Madie Reno, MD   75 mg at 10/20/18 0175    Lab Results:  Results for orders placed or performed during the hospital encounter of 10/04/18 (from the past 48 hour(s))  Glucose, capillary     Status: Abnormal   Collection Time: 10/18/18  4:58 PM  Result Value Ref Range   Glucose-Capillary 107 (H) 70 - 99 mg/dL  Glucose, capillary     Status: Abnormal   Collection Time: 10/19/18 12:40 AM  Result Value Ref Range   Glucose-Capillary 115 (H) 70 - 99 mg/dL   Comment 1 Notify RN   Glucose, capillary     Status: Abnormal   Collection Time: 10/19/18  5:47 AM  Result Value Ref Range   Glucose-Capillary 116 (H) 70 -  99 mg/dL   Comment 1 Notify RN   Glucose, capillary     Status: Abnormal   Collection Time: 10/19/18 12:10 PM  Result Value Ref Range   Glucose-Capillary 124 (H) 70 - 99 mg/dL   Comment 1 Notify RN   Glucose, capillary     Status: Abnormal   Collection Time: 10/19/18  5:17 PM  Result Value Ref Range   Glucose-Capillary 106 (H) 70 - 99 mg/dL   Comment 1 Notify RN   Glucose, capillary     Status:  Abnormal   Collection Time: 10/20/18 12:15 AM  Result Value Ref Range   Glucose-Capillary 128 (H) 70 - 99 mg/dL  Glucose, capillary     Status: Abnormal   Collection Time: 10/20/18  5:34 AM  Result Value Ref Range   Glucose-Capillary 122 (H) 70 - 99 mg/dL  Comprehensive metabolic panel     Status: Abnormal   Collection Time: 10/20/18  5:38 AM  Result Value Ref Range   Sodium 137 135 - 145 mmol/L   Potassium 4.1 3.5 - 5.1 mmol/L   Chloride 107 98 - 111 mmol/L   CO2 25 22 - 32 mmol/L   Glucose, Bld 120 (H) 70 - 99 mg/dL   BUN 29 (H) 8 - 23 mg/dL   Creatinine, Ser 0.69 0.44 - 1.00 mg/dL   Calcium 8.4 (L) 8.9 - 10.3 mg/dL   Total Protein 5.6 (L) 6.5 - 8.1 g/dL   Albumin 2.6 (L) 3.5 - 5.0 g/dL   AST 38 15 - 41 U/L   ALT 33 0 - 44 U/L   Alkaline Phosphatase 79 38 - 126 U/L   Total Bilirubin 0.4 0.3 - 1.2 mg/dL   GFR calc non Af Amer >60 >60 mL/min   GFR calc Af Amer >60 >60 mL/min   Anion gap 5 5 - 15    Comment: Performed at [facility], [address] [place], [place] [address]  Magnesium     Status: None   Collection Time: 10/20/18  5:38 AM  Result Value Ref Range   Magnesium 1.9 1.7 - 2.4 mg/dL    Comment: Performed at [facility], [address] [place], [place] [address]  Phosphorus     Status: Abnormal   Collection Time: 10/20/18  5:38 AM  Result Value Ref Range   Phosphorus 5.1 (H) 2.5 - 4.6 mg/dL    Comment: Performed at [facility], [address] [place], [place] [address]  Glucose, capillary     Status: Abnormal   Collection Time: 10/20/18 11:27 AM  Result Value Ref Range   Glucose-Capillary 120 (H) 70 - 99 mg/dL    Blood Alcohol level:  No results found for: [facility]  Metabolic Disorder Labs: No results found for: HGBA1C, MPG No results found for: PROLACTIN Lab Results  Component Value Date   TRIG 198 (H) 10/17/2018    Physical Findings: AIMS:  , ,  ,  ,    CIWA:    COWS:     Musculoskeletal: Strength & Muscle Tone:  Weak Gait & Station: Requires some assistance Patient leans: N/A  Psychiatric Specialty Exam: Physical Exam  Constitutional: She is oriented to person, place, and time. She appears well-developed and well-nourished. No distress.  HENT:  Head: Normocephalic and atraumatic.  Eyes: Conjunctivae are normal.  Neck: Normal range of motion.  Respiratory: Effort normal.  GI:  Abdominal wound intact   Musculoskeletal: Normal range of motion.  Neurological: She is alert and oriented  to person, place, and time.  Skin: Skin is warm and dry.  Psychiatric: Her speech is normal. Her mood appears anxious. Thought content is not paranoid and not delusional. Cognition and memory are not impaired. She exhibits a depressed mood. She expresses no homicidal and no suicidal ideation.    Review of Systems  Constitutional: Positive for weight loss. Negative for fever.  Respiratory:       History of sleep apnea, was not able to tolerate CPAP and returned CPAP supplies. She is not interested in re-trying.  Psychiatric/Behavioral: Positive for depression. Negative for hallucinations, memory loss, substance abuse and suicidal ideas. The patient is nervous/anxious (Sleeps in upright position for "naps". States she is unable to maintain sleep for more than 2 hours at a time. ) and has insomnia.     Blood pressure 126/65, pulse 79, temperature 97.7 F (36.5 C), temperature source Oral, resp. rate 18, height 5\' 3"  (1.6 m), weight 76.2 kg, SpO2 96 %.Body mass index is 29.76 kg/m.  General Appearance: Casual and Neat  Eye Contact:  Good  Speech:  Clear and Coherent  Volume:  Normal  Mood:  Anxious and Dysphoric  Affect:  Appropriate  Thought Process:  Coherent, Goal Directed and Linear  Orientation:  Full (Time, Place, and Person)  Thought Content:  Illogical and Hallucinations: None  Suicidal Thoughts:  No  Homicidal Thoughts:  No  Memory:  Immediate;   Good Recent;   Good Remote;   Good  Judgement:  Fair   Insight:  Fair  Psychomotor Activity:  Normal  Concentration:  Concentration: Fair and Attention Span: Fair  Recall:  [employer] of Knowledge:  Fair  Language:  Good  Akathisia:  No  Handed:  Right  AIMS (if indicated):   n/a  Assets:  Communication Skills Desire for Improvement Financial Resources/Insurance Housing Resilience Social Support  ADL's:  Impaired  Cognition:  WNL  Sleep:   Poor, history of OSA, noncompliant with CPAP therapy.`     Treatment Plan Summary: Daily contact with patient to assess and evaluate symptoms and progress in treatment, Medication management and Patient is agreeable to continuing on Effexor 75 mg twice daily, and Abilify 2 mg.   I have discussed with daughter ongoing mental health support for patient.  I have recommended that they contact Dr. Starleen Arms office regarding a follow-up psychiatry appointment, as well as resources for psychotherapy.  I have also directed daughter to psychology today website in order to look for therapists/counselors that may be able to provide services in patient's home or by phone.  I have offered the patient auto CPAP while in hospital, patient declines.   Disposition: No evidence of imminent risk to self or others at present.   Patient does not meet criteria for psychiatric inpatient admission. Supportive therapy provided about ongoing stressors.   Lavella Hammock, MD 10/20/2018, 3:00 PM

## 2018-10-21 LAB — GLUCOSE, CAPILLARY
GLUCOSE-CAPILLARY: 121 mg/dL — AB (ref 70–99)
Glucose-Capillary: 107 mg/dL — ABNORMAL HIGH (ref 70–99)
Glucose-Capillary: 109 mg/dL — ABNORMAL HIGH (ref 70–99)
Glucose-Capillary: 110 mg/dL — ABNORMAL HIGH (ref 70–99)
Glucose-Capillary: 112 mg/dL — ABNORMAL HIGH (ref 70–99)

## 2018-10-21 LAB — PHOSPHORUS: Phosphorus: 4.3 mg/dL (ref 2.5–4.6)

## 2018-10-21 MED ORDER — TRACE MINERALS CR-CU-MN-SE-ZN 10-1000-500-60 MCG/ML IV SOLN
INTRAVENOUS | Status: AC
  Administered 2018-10-21: 18:00:00 via INTRAVENOUS
  Filled 2018-10-21: qty 1992

## 2018-10-21 MED ORDER — FAT EMULSION PLANT BASED 20 % IV EMUL
250.0000 mL | INTRAVENOUS | Status: AC
  Administered 2018-10-21: 250 mL via INTRAVENOUS
  Filled 2018-10-21: qty 250

## 2018-10-21 MED ORDER — LOPERAMIDE HCL 2 MG PO CAPS
4.0000 mg | ORAL_CAPSULE | Freq: Four times a day (QID) | ORAL | Status: DC
  Administered 2018-10-21 – 2018-10-24 (×14): 4 mg via ORAL
  Filled 2018-10-21 (×14): qty 2

## 2018-10-21 NOTE — Consult Note (Signed)
PHARMACY - ADULT TOTAL PARENTERAL NUTRITION CONSULT NOTE   Pharmacy Consult for TPN management Indication: malnutrition  Patient Measurements: Height: 5\' 3"  (160 cm) Weight: 167 lb 15.9 oz (76.2 kg) IBW/kg (Calculated) : 52.4 TPN AdjBW (KG): 77 Body mass index is 29.76 kg/m.  Assessment: 73 y/o female with h/o transverse colectomy for unresectable colon polyp on 08/23/18 recent admission for incisional abscess and wound dehiscence requiring exploratory laparotomy incisional abscess washout, primary closure of anastomosis leak, placement of phasix mesh 11/17; pt now admitted for abdominal pain, stool in JP drain and wound dehiscence   This patient had a recent admission and received TPN from 11/22-12/3 and tolerated TPN well. Pt reported that her appetite and oral intake had been severely decreased over the previous week. She had lost ~15-20lb(9%) wt loss over the 3 weeks prior to this admission.  GI: triglycerides have increased above normal limits but still less than 200. A follow-up level is scheduled Endo: sensitive SSI q6h - Insulin requirements in the past 24 hours: 1 unit BG over past 24 hours 106-128  Lytes: all WNL  Renal: Scr<1  ID: [id number]  TPN Access: 12/18 TPN start date: 12/18 Nutritional Goals (per RD recommendation on 12/18): Regimen @ goal rate provides 1894kcal/day, 100g/day protein, 2222ml volume   Goal TPN rate is 83 ml/hr (provides 90% of their needs)  Current Nutrition: NPO except sips with meds + TPN  Plan: 10/20/18 Continue TPN today. Patient is at goal and will remain on the same rate/formulation. The plan is for patient to return home with home health, TPN, and private duty nursing  Continue Clinimix E 5/15 TPN at goal rate of 83 mL/hr + 20% lipids at 40mL/hr Additional Electrolytes in TPN: none Add MVI, trace elements, vitC 500 mg Continue q6h CBGS and sensitive SSI and adjust as needed  F/U electrolytes 10/24/2018  Dallie Piles,  PharmD Clinical Pharmacist 10/21/2018 2:44 PM

## 2018-10-21 NOTE — Progress Notes (Signed)
Physical Therapy Treatment Patient Details Name: Cassie Carlson MRN: [id number] DOB: September 15, 1946 Today's Date: 10/21/2018    History of Present Illness Pt. is a 73 y/o female with h/o transverse colectomy for unresectable colon polyp on 08/23/18 recent admission for incisional abscess and wound dehiscence requiring exploratory laparotomy incisional abscess washout, primary closure of anastomosis leak, placement of phasix mesh 11/17; pt now admitted for abdominal pain, stool in JP drain and wound dehiscence.     PT Comments    Pt was seen for exercise after she had taken a longer walk around the nurses' station and was not feeling like walking again.  Did bed ex's with her and noted mild LE edema, but was using LE's well to functionally move and work in a way to increase ROM and LE strength.  Her plan is to progress her to gait on stairs or other high level challenges if possible and will hopefully shorten need to stay in SNF.   Follow Up Recommendations  SNF     Equipment Recommendations  Other (comment)    Recommendations for Other Services       Precautions / Restrictions Precautions Precautions: Fall Precaution Comments: abdominal incision with wound suction, colostomy; Restrictions Weight Bearing Restrictions: No    Mobility  Bed Mobility               General bed mobility comments: declined as she had just taken an extensive walk  Transfers                    Ambulation/Gait                 Stairs             Wheelchair Mobility    Modified Rankin (Stroke Patients Only)       Balance                                            Cognition Arousal/Alertness: Awake/alert Behavior During Therapy: WFL for tasks assessed/performed Overall Cognitive Status: Within Functional Limits for tasks assessed                                        Exercises General Exercises - Lower Extremity Ankle  Circles/Pumps: AROM;Both;5 reps Quad Sets: AROM;Both;10 reps Gluteal Sets: AROM;Both;10 reps Heel Slides: AROM;Both;20 reps Hip ABduction/ADduction: AROM;Both;20 reps Straight Leg Raises: AROM;Both;10 reps Hip Flexion/Marching: AROM;Both;10 reps    General Comments        Pertinent Vitals/Pain Pain Assessment: Faces Faces Pain Scale: Hurts little more Pain Location: Abdomen Pain Descriptors / Indicators: Aching;Guarding Pain Intervention(s): Monitored during session;Repositioned    Home Living                      Prior Function            PT Goals (current goals can now be found in the care plan section) Progress towards PT goals: Progressing toward goals    Frequency    Min 2X/week      PT Plan Current plan remains appropriate    Co-evaluation              AM-PAC PT "6 Clicks" Mobility   Outcome Measure  Help needed turning from your back  to your side while in a flat bed without using bedrails?: None Help needed moving from lying on your back to sitting on the side of a flat bed without using bedrails?: None Help needed moving to and from a bed to a chair (including a wheelchair)?: None Help needed standing up from a chair using your arms (e.g., wheelchair or bedside chair)?: None Help needed to walk in hospital room?: A Little Help needed climbing 3-5 steps with a railing? : A Little 6 Click Score: 22    End of Session   Activity Tolerance: Patient tolerated treatment well Patient left: in bed;with call bell/phone within reach;with bed alarm set Nurse Communication: Mobility status PT Visit Diagnosis: Unsteadiness on feet (R26.81);Muscle weakness (generalized) (M62.81);Other abnormalities of gait and mobility (R26.89)     Time: 6440-3474 PT Time Calculation (min) (ACUTE ONLY): 24 min  Charges:  $Therapeutic Exercise: 23-37 mins                Ramond Dial 10/21/2018, 5:35 PM   Mee Hives, PT MS Acute Rehab Dept. Number: [place] and [place]

## 2018-10-21 NOTE — Progress Notes (Signed)
OT Cancellation Note  Patient Details Name: Cassie Carlson MRN: [id number] DOB: September 08, 1946   Cancelled Treatment:    Reason Eval/Treat Not Completed: Fatigue/lethargy limiting ability to participate. Upon attempt, pt up in recliner, daughter in room. Pt reported she just walked around the nurses station a couple times with the RN. Pt politely declining further activity at this time 2/2 fatigue, but agreeable to attempt next date.   Jeni Salles, MPH, MS, OTR/L ascom [phone] 10/21/18, 3:35 PM

## 2018-10-21 NOTE — Progress Notes (Signed)
Subjective:  CC:  Cassie Carlson is a 73 y.o. female  Hospital stay day 17,    08/22/18  lap converted to open transverse colectomy for unresectable colon polyp.  09/03/18 exlap, wound washout, Phasix mesh placement over dehiscence from above.   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  Output is still high.  Now having consistent vomiting with octreotdie  ROS:  A 5 point review of systems was performed and pertinent positives and negatives noted in HPI.   Objective:      Temp:  [97.8 F (36.6 C)-98.4 F (36.9 C)] 98.4 F (36.9 C) (01/03 0437) Pulse Rate:  [76-78] 78 (01/03 1138) Resp:  [20] 20 (01/03 0437) BP: (105-121)/(47-60) 121/60 (01/03 1138) SpO2:  [96 %-99 %] 96 % (01/03 1138)     Height: 5\' 3"  (160 cm) Weight: 76.2 kg BMI (Calculated): 29.77   Intake/Output this shift:   Intake/Output Summary (Last 24 hours) at 10/21/2018 1507 Last data filed at 10/21/2018 1424 Gross per 24 hour  Intake 2821.43 ml  Output 2700 ml  Net 121.43 ml   ~925ml/24hr of fistula output  Constitutional :  alert, cooperative, appears stated age and no distress  Respiratory:  clear to auscultation bilaterally  Cardiovascular:  regular rate and rhythm  Gastrointestinal: soft, non-tender; bowel sounds normal; no masses,  no organomegaly. Midline wound covered in Eakin pouch and ileostomy pouch intact.  Midline wound has thick granulation tissue wound around the edges that is slowly closing in space.  The open space measures approx 5.5 cm x 3 cm now per wound care report.  Output is yellow tinged secondary to cholestyramine, draining well into catheter bag.  The former blake drain site closed now. Ileostomy  bag intact  Skin: Cool and moist.   Psychiatric: Normal affect, non-agitated, not confused       LABS:  CMP Latest Ref Rng & Units 10/20/2018 10/17/2018 10/13/2018  Glucose 70 - 99 mg/dL 120(H) 94 129(H)  BUN 8 - 23 mg/dL 29(H) 29(H) 33(H)  Creatinine 0.44 - 1.00 mg/dL 0.69 0.77 0.72  Sodium 135 - 145 mmol/L 137 137 138  Potassium 3.5 - 5.1 mmol/L 4.1 4.0 4.3  Chloride 98 - 111 mmol/L 107 104 105  CO2 22 - 32 mmol/L 25 26 26   Calcium 8.9 - 10.3 mg/dL 8.4(L) 8.6(L) 8.6(L)  Total Protein 6.5 - 8.1 g/dL 5.6(L) 5.6(L) 5.8(L)  Total Bilirubin 0.3 - 1.2 mg/dL 0.4 0.4 0.4  Alkaline Phos 38 - 126 U/L 79 75 77  AST 15 - 41 U/L 38 40 49(H)  ALT 0 - 44 U/L 33 35 41   CBC Latest Ref Rng & Units 10/17/2018 10/10/2018 10/09/2018  WBC 4.0 - 10.5 K/uL 4.6 6.8 5.9  Hemoglobin 12.0 - 15.0 g/dL 9.3(L) 10.0(L) 10.3(L)  Hematocrit 36.0 - 46.0 % 28.9(L) 31.8(L) 32.4(L)  Platelets 150 - 400 K/uL 281 267 276    RADS: n/a Assessment:    08/22/18  lap converted to open transverse colectomy for unresectable colon polyp.  09/03/18 exlap, wound washout, Phasix mesh placement over dehiscence from above.   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  Will increase loperamide to 4mg  QID to see if any difference in output.  Recorded number past 24hrs, falsely low since the daughter reported they had significant output not measured during the bag change.  Currently has about 340ml out already the past 7hours, so output likely has remained high yesterday as well.   Continue   NPO, TPN and local wound care.   At this point, there is no additional medical interventions that can be given for the time  being to further decrease output.  In addition, we have slowly been transitioning the wound care and Eakin pouch maintenance over to the patient, in preparation for discharge.  D/c will have to be postponed again due to increase output recorded past 24hrs.

## 2018-10-21 NOTE — Care Management (Signed)
Barrier from 1/2 was increased output and nausea.  Plan is still for patient to discharge with [facility] home health services, TPN and RW.  PICC extension was delivered to bedside RN

## 2018-10-21 NOTE — Care Management Important Message (Signed)
Patient aware of Medicare IM right. Paper copy not left upon patient's request.

## 2018-10-21 NOTE — Consult Note (Signed)
[place] Nurse wound follow up Wound type: EC fistula. Patient changed pouch independently with daughter standing by and Bedside RN Elita Quick) standing by. Attached to bedside drainage bag. Pouching system intact today.  [place] Nurse asked to see today to prepare a new pattern as wound has significantly diminished in size. Wound visualized through pouching system and two pouches cut to size. Patient instructed that resizing may be needed as time goes on and that she could do this by cutting a practice opening out of paper and then tracing it onto the back of the new pouch. Measurement: 5.5cm x 3.5cm Wound bed: red, moist. Stomatized fistula at 6 o'clock Drainage (amount, consistency, odor) yellow/gold effluent with sediment draining into tail closure and into bedside drainage bag Periwound:Not seen today Dressing procedure/placement/frequency: Changing of Eakin fistula pouch is "upon leakage" at this time, typically twice weekly.  I have provided two pouches cut to fit new measurement and 4 other new (uncut) pouches to bedside.  Patient is expressing feelings of encouragement due to diminishing size of wound surrounding fistula.  [place] nursing team will follow, and will remain available to this patient, the nursing, surgical and medical teams.  Please re-consult if needed in-between visits Thanks, Maudie Flakes, MSN, RN, Chancy Milroy, Arther Abbott  Pager# [phone]

## 2018-10-22 LAB — GLUCOSE, CAPILLARY
Glucose-Capillary: 114 mg/dL — ABNORMAL HIGH (ref 70–99)
Glucose-Capillary: 115 mg/dL — ABNORMAL HIGH (ref 70–99)
Glucose-Capillary: 99 mg/dL (ref 70–99)

## 2018-10-22 MED ORDER — HYDROCORTISONE 1 % EX CREA
TOPICAL_CREAM | Freq: Two times a day (BID) | CUTANEOUS | Status: DC
  Administered 2018-10-22: 13:00:00 via TOPICAL
  Filled 2018-10-22: qty 28

## 2018-10-22 MED ORDER — TRACE MINERALS CR-CU-MN-SE-ZN 10-1000-500-60 MCG/ML IV SOLN
INTRAVENOUS | Status: AC
  Administered 2018-10-22: 19:00:00 via INTRAVENOUS
  Filled 2018-10-22: qty 1992

## 2018-10-22 MED ORDER — FAT EMULSION PLANT BASED 20 % IV EMUL
250.0000 mL | INTRAVENOUS | Status: AC
  Administered 2018-10-22: 250 mL via INTRAVENOUS
  Filled 2018-10-22: qty 250

## 2018-10-22 MED ORDER — HYDROCORTISONE 1 % EX CREA
TOPICAL_CREAM | Freq: Three times a day (TID) | CUTANEOUS | Status: DC | PRN
  Administered 2018-10-22: 19:00:00 via TOPICAL
  Administered 2018-10-23: 1 via TOPICAL
  Filled 2018-10-22: qty 28

## 2018-10-22 NOTE — Consult Note (Signed)
PHARMACY - ADULT TOTAL PARENTERAL NUTRITION CONSULT NOTE   Pharmacy Consult for TPN management Indication: malnutrition  Patient Measurements: Height: 5\' 3"  (160 cm) Weight: 169 lb 8.5 oz (76.9 kg) IBW/kg (Calculated) : 52.4 TPN AdjBW (KG): 77 Body mass index is 30.03 kg/m.  Assessment: 73 y/o female with h/o transverse colectomy for unresectable colon polyp on 08/23/18 recent admission for incisional abscess and wound dehiscence requiring exploratory laparotomy incisional abscess washout, primary closure of anastomosis leak, placement of phasix mesh 11/17; pt now admitted for abdominal pain, stool in JP drain and wound dehiscence   This patient had a recent admission and received TPN from 11/22-12/3 and tolerated TPN well. Pt reported that her appetite and oral intake had been severely decreased over the previous week. She had lost ~15-20lb(9%) wt loss over the 3 weeks prior to this admission.  GI: triglycerides have increased above normal limits but still less than 200. A follow-up level is scheduled Endo: sensitive SSI q6h - Insulin requirements in the past 24 hours: 1 unit BG over past 24 hours 109-121  Lytes: all WNL  Renal: Scr<1  ID: [id number] [email or web address]  TPN Access: 12/18 TPN start date: 12/18 Nutritional Goals (per RD recommendation on 12/18): Regimen @ goal rate provides 1894kcal/day, 100g/day protein, 2288ml volume   Goal TPN rate is 83 ml/hr (provides 90% of their needs)  Current Nutrition: NPO except sips with meds + TPN  Plan: 10/20/18 Continue TPN today. Patient is at goal and will remain on the same rate/formulation. The plan is for patient to return home with home health, TPN, and private duty nursing  Continue Clinimix E 5/15 TPN at goal rate of 83 mL/hr + 20% lipids at 36mL/hr Additional Electrolytes in TPN: none Add MVI, trace elements, vitC 500 mg Continue q6h CBGS and sensitive SSI and adjust as needed  F/U electrolytes 10/24/2018  Jahziel Sinn L 10/22/2018  10:09 AM

## 2018-10-22 NOTE — Progress Notes (Signed)
Subjective:  CC:  Cassie Carlson is a 73 y.o. female  Hospital stay day 18,    08/22/18  lap converted to open transverse colectomy for unresectable colon polyp.  09/03/18 exlap, wound washout, Phasix mesh placement over dehiscence from above.   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  ROS:  A 5 point review of systems was performed and pertinent positives and negatives noted in HPI.   Objective:      Temp:  [98.1 F (36.7 C)] 98.1 F (36.7 C) (01/04 0454) Pulse Rate:  [76] 76 (01/04 0443) Resp:  [16] 16 (01/04 0443) BP: (116)/(61) 116/61 (01/04 0443) SpO2:  [95 %] 95 % (01/04 0443) Weight:  [76.9 kg] 76.9 kg (01/04 0524)     Height: 5\' 3"  (160 cm) Weight: 76.9 kg BMI (Calculated): 30.04   Intake/Output this shift:   Intake/Output Summary (Last 24 hours) at 10/22/2018 1241 Last data filed at 10/22/2018 1017 Gross per 24 hour  Intake 1873.66 ml  Output 2475 ml  Net -601.34 ml   ~962ml/24hr of fistula output  Constitutional :  alert, cooperative, appears stated age and no distress  Respiratory:  clear to auscultation bilaterally  Cardiovascular:  regular rate and rhythm  Gastrointestinal: soft, non-tender; bowel sounds normal; no masses,  no organomegaly. Midline wound covered in Eakin pouch and ileostomy pouch intact.  Midline wound has thick granulation tissue wound around the edges that is slowly closing in space.  The open space measures approx 5.5 cm x 3 cm Output is yellow tinged secondary to cholestyramine, draining well into catheter bag.  The former blake drain site closed now. Ileostomy bag intact  Skin: Cool and moist.   Psychiatric: Normal affect, non-agitated, not confused       LABS:  CMP  Latest Ref Rng & Units 10/20/2018 10/17/2018 10/13/2018  Glucose 70 - 99 mg/dL 120(H) 94 129(H)  BUN 8 - 23 mg/dL 29(H) 29(H) 33(H)  Creatinine 0.44 - 1.00 mg/dL 0.69 0.77 0.72  Sodium 135 - 145 mmol/L 137 137 138  Potassium 3.5 - 5.1 mmol/L 4.1 4.0 4.3  Chloride 98 - 111 mmol/L 107 104 105  CO2 22 - 32 mmol/L 25 26 26   Calcium 8.9 - 10.3 mg/dL 8.4(L) 8.6(L) 8.6(L)  Total Protein 6.5 - 8.1 g/dL 5.6(L) 5.6(L) 5.8(L)  Total Bilirubin 0.3 - 1.2 mg/dL 0.4 0.4 0.4  Alkaline Phos 38 - 126 U/L 79 75 77  AST 15 - 41 U/L 38 40 49(H)  ALT 0 - 44 U/L 33 35 41   CBC Latest Ref Rng & Units 10/17/2018 10/10/2018 10/09/2018  WBC 4.0 - 10.5 K/uL 4.6 6.8 5.9  Hemoglobin 12.0 - 15.0 g/dL 9.3(L) 10.0(L) 10.3(L)  Hematocrit 36.0 - 46.0 % 28.9(L) 31.8(L) 32.4(L)  Platelets 150 - 400 K/uL 281 267 276    RADS: n/a Assessment:    08/22/18  lap converted to open transverse colectomy for unresectable colon polyp.  09/03/18 exlap, wound washout, Phasix mesh placement over dehiscence from above.   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   Will continue with loperamide 4mg  QID and cholestyramine, NPO, TPN and local wound care.   At this point, there is no additional medical interventions that can be given for the time being to further decrease output.    Will monitor output for additional 24hrs, and anticipate discharge in 48hrs if no change in output.  Pt and daughter verbalized understanding.

## 2018-10-23 LAB — GLUCOSE, CAPILLARY
GLUCOSE-CAPILLARY: 113 mg/dL — AB (ref 70–99)
Glucose-Capillary: 116 mg/dL — ABNORMAL HIGH (ref 70–99)
Glucose-Capillary: 99 mg/dL (ref 70–99)

## 2018-10-23 MED ORDER — TRACE MINERALS CR-CU-MN-SE-ZN 10-1000-500-60 MCG/ML IV SOLN
INTRAVENOUS | Status: AC
  Administered 2018-10-23: 19:00:00 via INTRAVENOUS
  Filled 2018-10-23: qty 1992

## 2018-10-23 MED ORDER — FAT EMULSION PLANT BASED 20 % IV EMUL
250.0000 mL | INTRAVENOUS | Status: AC
  Administered 2018-10-23: 250 mL via INTRAVENOUS
  Filled 2018-10-23: qty 250

## 2018-10-23 NOTE — Consult Note (Signed)
PHARMACY - ADULT TOTAL PARENTERAL NUTRITION CONSULT NOTE   Pharmacy Consult for TPN management Indication: malnutrition  Patient Measurements: Height: 5\' 3"  (160 cm) Weight: 173 lb 15.1 oz (78.9 kg) IBW/kg (Calculated) : 52.4 TPN AdjBW (KG): 77 Body mass index is 30.81 kg/m.  Assessment: 73 y/o female with h/o transverse colectomy for unresectable colon polyp on 08/23/18 recent admission for incisional abscess and wound dehiscence requiring exploratory laparotomy incisional abscess washout, primary closure of anastomosis leak, placement of phasix mesh 11/17; pt now admitted for abdominal pain, stool in JP drain and wound dehiscence   This patient had a recent admission and received TPN from 11/22-12/3 and tolerated TPN well. Pt reported that her appetite and oral intake had been severely decreased over the previous week. She had lost ~15-20lb(9%) wt loss over the 3 weeks prior to this admission.  GI: triglycerides have increased above normal limits but still less than 200. A follow-up level is scheduled Endo: sensitive SSI q6h - Insulin requirements in the past 24 hours: 1 unit BG over past 24 hours 109-121  Lytes: all WNL  Renal: Scr<1  ID: [id number] wnl  TPN Access: 12/18 TPN start date: 12/18 Nutritional Goals (per RD recommendation on 12/18): Regimen @ goal rate provides 1894kcal/day, 100g/day protein, 2231ml volume   Goal TPN rate is 83 ml/hr (provides 90% of their needs)  Current Nutrition: NPO except sips with meds + TPN  Plan:  Continue TPN today. Patient is at goal and will remain on the same rate/formulation. The plan is for patient to return home with home health, TPN, and private duty nursing  Continue Clinimix E 5/15 TPN at goal rate of 83 mL/hr + 20% lipids at 62mL/hr Additional Electrolytes in TPN: none Add MVI, trace elements, vitC 500 mg Continue q6h CBGS and sensitive SSI and adjust as needed  F/U electrolytes 10/24/2018  Simpson,Michael L 10/23/2018 12:23  PM

## 2018-10-24 LAB — DIFFERENTIAL
Abs Immature Granulocytes: 0.02 10*3/uL (ref 0.00–0.07)
Basophils Absolute: 0 10*3/uL (ref 0.0–0.1)
Basophils Relative: 0 %
Eosinophils Absolute: 0 10*3/uL (ref 0.0–0.5)
Eosinophils Relative: 1 %
Immature Granulocytes: 1 %
Lymphocytes Relative: 40 %
Lymphs Abs: 1.7 10*3/uL (ref 0.7–4.0)
Monocytes Absolute: 0.5 10*3/uL (ref 0.1–1.0)
Monocytes Relative: 13 %
Neutro Abs: 2 10*3/uL (ref 1.7–7.7)
Neutrophils Relative %: 45 %

## 2018-10-24 LAB — COMPREHENSIVE METABOLIC PANEL
ALT: 30 U/L (ref 0–44)
AST: 36 U/L (ref 15–41)
Albumin: 2.6 g/dL — ABNORMAL LOW (ref 3.5–5.0)
Alkaline Phosphatase: 72 U/L (ref 38–126)
Anion gap: 7 (ref 5–15)
BUN: 27 mg/dL — ABNORMAL HIGH (ref 8–23)
CO2: 24 mmol/L (ref 22–32)
Calcium: 8.4 mg/dL — ABNORMAL LOW (ref 8.9–10.3)
Chloride: 106 mmol/L (ref 98–111)
Creatinine, Ser: 0.67 mg/dL (ref 0.44–1.00)
GFR calc Af Amer: >60 mL/min (ref >60)
GFR calc non Af Amer: >60 mL/min (ref >60)
GLUCOSE: 101 mg/dL — AB (ref 70–99)
Potassium: 3.9 mmol/L (ref 3.5–5.1)
Sodium: 137 mmol/L (ref 135–145)
Total Bilirubin: 0.4 mg/dL (ref 0.3–1.2)
Total Protein: 5.4 g/dL — ABNORMAL LOW (ref 6.5–8.1)

## 2018-10-24 LAB — CBC
HCT: 28.5 % — ABNORMAL LOW (ref 36.0–46.0)
Hemoglobin: 9.1 g/dL — ABNORMAL LOW (ref 12.0–15.0)
MCH: 29.9 pg (ref 26.0–34.0)
MCHC: 31.9 g/dL (ref 30.0–36.0)
MCV: 93.8 fL (ref 80.0–100.0)
Platelets: 278 10*3/uL (ref 150–400)
RBC: 3.04 MIL/uL — ABNORMAL LOW (ref 3.87–5.11)
RDW: 13.6 % (ref 11.5–15.5)
WBC: 4.3 10*3/uL (ref 4.0–10.5)
nRBC: 0 % (ref 0.0–0.2)

## 2018-10-24 LAB — TRIGLYCERIDES: Triglycerides: 162 mg/dL — ABNORMAL HIGH (ref <150)

## 2018-10-24 LAB — GLUCOSE, CAPILLARY
GLUCOSE-CAPILLARY: 103 mg/dL — AB (ref 70–99)
Glucose-Capillary: 120 mg/dL — ABNORMAL HIGH (ref 70–99)
Glucose-Capillary: 116 mg/dL — ABNORMAL HIGH (ref 70–99)

## 2018-10-24 LAB — PHOSPHORUS: Phosphorus: 4.7 mg/dL — ABNORMAL HIGH (ref 2.5–4.6)

## 2018-10-24 LAB — MAGNESIUM: Magnesium: 1.9 mg/dL (ref 1.7–2.4)

## 2018-10-24 MED ORDER — VENLAFAXINE HCL 75 MG PO TABS: 75.0000 mg | ORAL_TABLET | Freq: Two times a day (BID) | ORAL | 0 refills | Status: DC

## 2018-10-24 MED ORDER — ASCORBIC ACID 500 MG PO TABS: 500.0000 mg | ORAL_TABLET | Freq: Two times a day (BID) | ORAL | 0 refills | Status: DC

## 2018-10-24 MED ORDER — SUCRALFATE 1 G PO TABS: 1.0000 g | ORAL_TABLET | Freq: Two times a day (BID) | ORAL | 0 refills | Status: DC | PRN

## 2018-10-24 MED ORDER — ONDANSETRON 4 MG PO TBDP: 4.0000 mg | ORAL_TABLET | Freq: Four times a day (QID) | ORAL | 0 refills | Status: DC | PRN

## 2018-10-24 MED ORDER — HYDROCORTISONE 1 % EX CREA: TOPICAL_CREAM | Freq: Three times a day (TID) | CUTANEOUS | 0 refills | Status: DC | PRN

## 2018-10-24 MED ORDER — CHOLESTYRAMINE 4 G PO PACK: 4.0000 g | PACK | Freq: Three times a day (TID) | ORAL | 12 refills | Status: DC

## 2018-10-24 MED ORDER — ADULT MULTIVITAMIN W/MINERALS CH: 1.0000 | ORAL_TABLET | Freq: Every day | ORAL | 0 refills | Status: DC

## 2018-10-24 MED ORDER — ARIPIPRAZOLE 2 MG PO TABS: 2.0000 mg | ORAL_TABLET | Freq: Every day | ORAL | 2 refills | Status: DC

## 2018-10-24 MED ORDER — OCUVITE-LUTEIN PO CAPS: 1.0000 | ORAL_CAPSULE | Freq: Every day | ORAL | 0 refills | Status: DC

## 2018-10-24 MED ORDER — LOPERAMIDE HCL 2 MG PO CAPS: 4.0000 mg | ORAL_CAPSULE | Freq: Four times a day (QID) | ORAL | 0 refills | Status: AC

## 2018-10-24 MED ORDER — ACETAMINOPHEN 500 MG PO TABS: 1000.0000 mg | ORAL_TABLET | Freq: Four times a day (QID) | ORAL | 0 refills | Status: DC

## 2018-10-24 NOTE — Progress Notes (Signed)
Patient discharge teaching given, including activity, diet, follow-up appoints, and medications. Patient verbalized understanding of all discharge instructions. PICC dressing clean and intact. RN went over wound care and PICC dressing with pt and pt.'s daughter. Pt and daughter verbalized understanding. Supplies given to pt and pt.'s daughter. Vitals are stable. Skin is intact except as charted in most recent assessments. Pt to be escorted out by RN , to be driven home by family.  Hadyn Azer [employer]

## 2018-10-24 NOTE — Progress Notes (Signed)
Nutrition Follow-up  DOCUMENTATION CODES:   Severe malnutrition in context of acute illness/injury  INTERVENTION:   -Continue Clinimix E 5/15 TPN at goal rate of 83 mL/hr + 20% lipids at 31m/hr  -Continue MVI, trace elements, and 500 mg vitamin C in TPN  -Complete TPN regimen providing 1894kcal/day, 100g/day protein,2233mvolume, which meets 100% of estimated kcal and protein needs  -Continue daily weights  NUTRITION DIAGNOSIS:   Severe Malnutrition related to acute illness(abdominal wound dehiscence and fistula ) as evidenced by percent weight loss, severe fat depletion, moderate muscle depletion, energy intake < or equal to 50% for > or equal to 5 days.  Ongoing  GOAL:   Patient will meet greater than or equal to 90% of their needs  Met with TPN  MONITOR:   Labs, Weight trends, Skin, I & O's, Other (Comment)(TPN)  REASON FOR ASSESSMENT:   Consult New TPN/TNA  ASSESSMENT:   [age]/o female with h/o transverse colectomy for unresectable colon polyp on 08/23/18 recent admission for incisional abscess and wound dehiscence requiring exploratory laparotomy incisional abscess washout, primary closure of anastomosis leak, placement of phasix mesh 11/17; pt now admitted with entero-atmospheric fistula  Reviewed I/O's: -1.5 L  X 24 hours and +10.1 L since 10/10/18  Drain output: 725 ml x 24 hours  Wt has been trending up since admission, which is favorable given malnutrition.   Per CWOCN note on 10/21/18; EC fistula diminishing in size.   Pt remains NPO and TPN dependent. Per pharmacy note, plan to continue Clinimix E 5/15 TPN at goal rate of 83 mL/hr + 20% lipids at 2042mr. Complete regimen provides 1894 kcals, 100 grams protein, and 2232 ml total volume daily, which meets 100% of estimated kcal and protein needs. Pt also receiving MVI, trace elements, and 500 mg vitamin C in TPN.   Plan remains for pt to d/c home with TPN to support continued wound healing.   Labs  reviewed: CBGS: 99-120 (inpatient orders for glycemic control are 0-9 units insulin aspart every 6 hours). K, Mg, and Phos WDL. Triglycerides: 162.   Diet Order:   Diet Order            Diet NPO time specified Except for: Sips with Meds  Diet effective now              EDUCATION NEEDS:   Education needs have been addressed  Skin:  Skin Assessment: (abdominal incision, open space measures approx 5cm x 4 cm x 1cm deep.  The measurments total 8cm x 5cm x 1cm if you include the granluation tissue and measure from regular skin edge to skin edge.)  Last BM:  10/23/18 (10 ml output via ileostomy)  Height:   Ht Readings from Last 1 Encounters:  10/04/18 '5\' 3"'  (1.6 m)    Weight:   Wt Readings from Last 1 Encounters:  10/24/18 78.5 kg    Ideal Body Weight:  52.3 kg  BMI:  Body mass index is 30.66 kg/m.  Estimated Nutritional Needs:   Kcal:  1700-1900kcal/day   Protein:  84-94g/day   Fluid:  >1.4L/day     Cassie Carlson A. WilJimmye NormanD, LDN, CDE Pager: [phone] hours Pager: [phone]

## 2018-10-24 NOTE — Care Management (Signed)
Patienthas small bowel fistula whichrequiresabdomentobepositionedinways  notfeasiblewithanormalbed.Headmustbeelevatedatleast30degreesormay lead to increased leakage. small bowel fistula frequently requires changesinbodypositionwhichcannotbeachievedwithanormalbed

## 2018-10-24 NOTE — Care Management Note (Signed)
Case Management Note  Patient Details  Name: Cassie Carlson MRN: [id number] Date of Birth: 02-18-1946   Patient to discharge today.  Corene Cornea with [facility] aware.  TPN to be delivered to home between 6-7pm.  Per [facility] pharmacy patient can discharge after 5pm.  Patient and bedside RN notified.  Daughter to transport at discharge. Per Corene Cornea with [facility] bed has been requested for delivery today.    Subjective/Objective:                    Action/Plan:   Expected Discharge Date:  10/24/18               Expected Discharge Plan:  [place]  In-House Referral:     Discharge planning Services  CM Consult  Post Acute Care Choice:  Home Health, Durable Medical Equipment( Infusion ) Choice offered to:  Patient, Adult Children  DME Arranged:  Hospital bed, IV pump/equipment(Has not made decis8ion regarding infusion agency) DME Agency:  [place]:  RN, PT, OT, Nurse's Aide [facility] Agency:  [employer](Agency for TPN has not been decided)  Status of Service:  Completed, signed off  If discussed at [place] of Stay Meetings, dates discussed:    Additional Comments:  Beverly Sessions, RN 10/24/2018, 3:35 PM

## 2018-10-24 NOTE — Progress Notes (Signed)
OT Cancellation Note  Patient Details Name: Cassie Carlson MRN: [id number] DOB: July 03, 1946   Cancelled Treatment:    Reason Eval/Treat Not Completed: Medical issues which prohibited therapy. Upon attempt, dtr and pt expressing concerns with abdominal wound pouch. RN called and came promptly to room to assess. MD also came in to the room as OT left. Will re-attempt OT tx at later time as pt is available and medically appropriate.   Jeni Salles, MPH, MS, OTR/L ascom [phone] 10/24/18, 12:04 PM

## 2018-10-24 NOTE — Care Management Important Message (Signed)
Patient aware of Medicare IM right.  Paper copy not left upon patient's request.

## 2018-10-25 ENCOUNTER — Other Ambulatory Visit: Payer: Self-pay | Admitting: Surgery

## 2018-10-25 DIAGNOSIS — K632 Fistula of intestine: Secondary | ICD-10-CM

## 2018-10-25 NOTE — Discharge Summary (Signed)
Physician Discharge Summary  Patient ID: Cassie Carlson MRN: [id number] DOB/AGE: [birth year] 73 y.o.  Admit date: 10/04/2018 Discharge date: 10/25/2018  Admission Diagnoses: EC fistula  Discharge Diagnoses:  Same as above, malnutrition  Discharged Condition: good  Hospital Course:  Pt with complicated history as noted below. 11/4/19lap converted to open transverse colectomy for unresectable colon polyp.  11/16/19exlap, wound washout, Phasix mesh placement over dehiscence fromabove.   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    In-house patient was placed on strict n.p.o. on TPN through a PICC line.  multiple combinations of IV and p.o. drugs were trialed and attempt to decrease the overall fistulous output while maintaining wound care to the area with an Eakin pouch.  Throughout her hospital stay the wound continued to decrease in size and the Eakin pouch maintained a good seal to minimize any sort of leakage.  However, there was no consistent medical regimen that was trialed to be beneficial in decreasing the overall fistulous output.  In the end the fistulous output continued steady to be around between 200 to 900 mL's/24hrs.  She will have a couple days of 200 mL's of output and then 1 day of increased output to around the 900s.  Which was subsequently dropped back down to the 200s in a day without any additional interventions or changes in her medical regimen.  Ideally we would have her continue to attempt a different medications but we are starting around our options.  During her hospitalization the patient did have episodic times of hallucinations and confusion which was noted on her previous hospital admissions that  resolved once she was discharged.  Due to the stalling of gaining output control, increasing confusion and hallucinations, we deemed it to be the best option for the patient to continue to receive care at home.  Prior experiences at nursing homes and Medicare criteria not allowing patient to be transferred to [facility] prohibited these options.  Prior to discharge we did ensure that the Eakin pouch remained leak free for an extended period of time, and that home health will be able to provide wound care as needed along with TPN management.  Consults: None  Discharge Exam: Blood pressure (!) 121/54, pulse 78, temperature 97.6 F (36.4 C), temperature source Oral, resp. rate 15, height 5\' 3"  (1.6 m), weight 78.5 kg, SpO2 97 %. Constitutional :  alert, cooperative, appears stated age and no distress  Respiratory:  clear to auscultation bilaterally  Cardiovascular:  regular rate and rhythm  Gastrointestinal: soft, non-tender; bowel sounds normal; no masses,  no organomegaly. Midline wound covered in Eakin pouch and ileostomy pouch intact.  Midline wound has thick granulation tissue wound around the edges that is slowly closing in space.  The open space measures approx 5.5 cm x 3 cm Output is yellow tinged secondary to cholestyramine, draining well into catheter bag.  The former blake drain site closed now. Ileostomy bag intact  Skin: Cool and moist.   Psychiatric: Normal affect, non-agitated, not confused     Disposition:  Discharge disposition: 01-Home or Self Care        Allergies as of 10/24/2018      Reactions   Asa [aspirin] Other (See Comments)   GI distress/irritaion   Codeine Other (See  Comments)   "Face turned red like a sunburn"   Ibuprofen Other (See Comments)   GI distress/irritation   Nsaids Other (See Comments)   GI distress/irritation   Other Itching   CHG wipes      Medication List    STOP taking these medications   ascorbic acid 500 MG tablet Commonly known as:   VITAMIN C   feeding supplement (ENSURE ENLIVE) Liqd   feeding supplement (PRO-STAT SUGAR FREE 64) Liqd   multivitamin with minerals Tabs tablet   multivitamin-lutein Caps capsule   venlafaxine XR 75 MG 24 hr capsule Commonly known as:  EFFEXOR-XR Replaced by:  venlafaxine 75 MG tablet     TAKE these medications   acetaminophen 500 MG tablet Commonly known as:  TYLENOL Take 2 tablets (1,000 mg total) by mouth every 6 (six) hours. What changed:    medication strength  how much to take  when to take this  reasons to take this   ARIPiprazole 2 MG tablet Commonly known as:  ABILIFY Take 1 tablet (2 mg total) by mouth daily.   cholestyramine 4 g packet Commonly known as:  QUESTRAN Take 1 packet (4 g total) by mouth 3 (three) times daily.   hydrocortisone cream 1 % Apply topically 3 (three) times daily as needed for itching.   levothyroxine 25 MCG tablet Commonly known as:  SYNTHROID, LEVOTHROID Take 25 mcg by mouth daily before breakfast.   loperamide 2 MG capsule Commonly known as:  IMODIUM Take 2 capsules (4 mg total) by mouth 4 (four) times daily.   ondansetron 4 MG disintegrating tablet Commonly known as:  ZOFRAN-ODT Take 1 tablet (4 mg total) by mouth every 6 (six) hours as needed for nausea.   RABEprazole 20 MG tablet Commonly known as:  ACIPHEX Take 20 mg by mouth 2 (two) times daily.   ranitidine 150 MG capsule Commonly known as:  ZANTAC Take 150 mg by mouth 2 (two) times daily.   sucralfate 1 g tablet Commonly known as:  CARAFATE Take 1 tablet (1 g total) by mouth 2 (two) times daily as needed. What changed:    when to take this  reasons to take this   venlafaxine 75 MG tablet Commonly known as:  EFFEXOR Take 1 tablet (75 mg total) by mouth 2 (two) times daily with a meal. Replaces:  venlafaxine XR 75 MG 24 hr capsule      Follow-up Information    Dan Dissinger, DO Follow up in 1 week(s).   Specialty:  Surgery Contact information: [address] [place] [address] [phone]            Total time spent arranging discharge was >75min. Signed: Benjamine Sprague 10/25/2018, 2:36 PM

## 2018-10-26 ENCOUNTER — Ambulatory Visit
Admission: RE | Admit: 2018-10-26 | Discharge: 2018-10-26 | Disposition: A | Discharge status: Home / Self Care | Payer: Medicare Other | Source: Ambulatory Visit | Attending: Surgery | Admitting: Surgery

## 2018-10-26 ENCOUNTER — Ambulatory Visit: Payer: Medicare Other

## 2018-10-26 ENCOUNTER — Other Ambulatory Visit: Payer: Self-pay | Admitting: Surgery

## 2018-10-26 DIAGNOSIS — K632 Fistula of intestine: Secondary | ICD-10-CM | POA: Diagnosis present

## 2018-10-26 DIAGNOSIS — R1030 Lower abdominal pain, unspecified: Secondary | ICD-10-CM | POA: Diagnosis not present

## 2018-10-26 MED ORDER — IOPAMIDOL (ISOVUE-300) INJECTION 61%
100.0000 mL | Freq: Once | INTRAVENOUS | Status: AC | PRN
  Administered 2018-10-26: 100 mL via INTRAVENOUS

## 2018-10-29 ENCOUNTER — Telehealth: Payer: Self-pay | Admitting: Surgery

## 2018-10-29 NOTE — Telephone Encounter (Signed)
Received a call from daughter . Mrs Heady is having some more pain. She had a recent CT scan showing no recent acute abnormalities. She is not having fevers and is back to baseline otherwise. D/W daughter about options of taking her to the ER vs prescription of gabapentin for adjuvant pain rx. She chose the latter one. She understands that if the pain persist she is encourage to visit the ER.

## 2018-10-30 ENCOUNTER — Encounter: Payer: Self-pay | Admitting: Emergency Medicine

## 2018-10-30 ENCOUNTER — Emergency Department: Payer: Medicare Other

## 2018-10-30 ENCOUNTER — Inpatient Hospital Stay
Admission: EM | Admit: 2018-10-30 | Discharge: 2018-11-01 | DRG: 394 | Disposition: A | Discharge status: Other Acute Inpatient Hospital | Payer: Medicare Other | Attending: Surgery | Admitting: Surgery

## 2018-10-30 ENCOUNTER — Other Ambulatory Visit: Payer: Self-pay

## 2018-10-30 DIAGNOSIS — R1084 Generalized abdominal pain: Secondary | ICD-10-CM | POA: Diagnosis not present

## 2018-10-30 DIAGNOSIS — Z981 Arthrodesis status: Secondary | ICD-10-CM

## 2018-10-30 DIAGNOSIS — Z79899 Other long term (current) drug therapy: Secondary | ICD-10-CM

## 2018-10-30 DIAGNOSIS — E860 Dehydration: Secondary | ICD-10-CM | POA: Diagnosis present

## 2018-10-30 DIAGNOSIS — Z95828 Presence of other vascular implants and grafts: Secondary | ICD-10-CM | POA: Diagnosis not present

## 2018-10-30 DIAGNOSIS — Y838 Other surgical procedures as the cause of abnormal reaction of the patient, or of later complication, without mention of misadventure at the time of the procedure: Secondary | ICD-10-CM | POA: Diagnosis present

## 2018-10-30 DIAGNOSIS — K219 Gastro-esophageal reflux disease without esophagitis: Secondary | ICD-10-CM | POA: Diagnosis present

## 2018-10-30 DIAGNOSIS — F329 Major depressive disorder, single episode, unspecified: Secondary | ICD-10-CM | POA: Diagnosis present

## 2018-10-30 DIAGNOSIS — G4730 Sleep apnea, unspecified: Secondary | ICD-10-CM | POA: Diagnosis present

## 2018-10-30 DIAGNOSIS — K9419 Other complications of enterostomy: Secondary | ICD-10-CM | POA: Diagnosis not present

## 2018-10-30 DIAGNOSIS — K632 Fistula of intestine: Secondary | ICD-10-CM | POA: Diagnosis present

## 2018-10-30 DIAGNOSIS — Z885 Allergy status to narcotic agent status: Secondary | ICD-10-CM

## 2018-10-30 DIAGNOSIS — Z91048 Other nonmedicinal substance allergy status: Secondary | ICD-10-CM | POA: Diagnosis not present

## 2018-10-30 DIAGNOSIS — F0390 Unspecified dementia without behavioral disturbance: Secondary | ICD-10-CM | POA: Diagnosis present

## 2018-10-30 DIAGNOSIS — E039 Hypothyroidism, unspecified: Secondary | ICD-10-CM | POA: Diagnosis present

## 2018-10-30 DIAGNOSIS — F419 Anxiety disorder, unspecified: Secondary | ICD-10-CM | POA: Diagnosis present

## 2018-10-30 DIAGNOSIS — Z9049 Acquired absence of other specified parts of digestive tract: Secondary | ICD-10-CM

## 2018-10-30 DIAGNOSIS — Z87891 Personal history of nicotine dependence: Secondary | ICD-10-CM | POA: Diagnosis not present

## 2018-10-30 DIAGNOSIS — Z886 Allergy status to analgesic agent status: Secondary | ICD-10-CM | POA: Diagnosis not present

## 2018-10-30 LAB — CBC WITH DIFFERENTIAL/PLATELET
Abs Immature Granulocytes: 0.01 10*3/uL (ref 0.00–0.07)
Basophils Absolute: 0 10*3/uL (ref 0.0–0.1)
Basophils Relative: 0 %
Eosinophils Absolute: 0 10*3/uL (ref 0.0–0.5)
Eosinophils Relative: 0 %
HCT: 33.5 % — ABNORMAL LOW (ref 36.0–46.0)
Hemoglobin: 10.7 g/dL — ABNORMAL LOW (ref 12.0–15.0)
Immature Granulocytes: 0 %
Lymphocytes Relative: 29 %
Lymphs Abs: 1.4 10*3/uL (ref 0.7–4.0)
MCH: 29.8 pg (ref 26.0–34.0)
MCHC: 31.9 g/dL (ref 30.0–36.0)
MCV: 93.3 fL (ref 80.0–100.0)
Monocytes Absolute: 0.5 10*3/uL (ref 0.1–1.0)
Monocytes Relative: 12 %
Neutro Abs: 2.8 10*3/uL (ref 1.7–7.7)
Neutrophils Relative %: 59 %
Platelets: 325 10*3/uL (ref 150–400)
RBC: 3.59 MIL/uL — ABNORMAL LOW (ref 3.87–5.11)
RDW: 13.4 % (ref 11.5–15.5)
WBC: 4.7 10*3/uL (ref 4.0–10.5)
nRBC: 0 % (ref 0.0–0.2)

## 2018-10-30 LAB — PREALBUMIN: Prealbumin: 31.5 mg/dL (ref 18–38)

## 2018-10-30 LAB — COMPREHENSIVE METABOLIC PANEL
ALT: 42 U/L (ref 0–44)
AST: 46 U/L — ABNORMAL HIGH (ref 15–41)
Albumin: 3.2 g/dL — ABNORMAL LOW (ref 3.5–5.0)
Alkaline Phosphatase: 90 U/L (ref 38–126)
Anion gap: 10 (ref 5–15)
BILIRUBIN TOTAL: 0.5 mg/dL (ref 0.3–1.2)
BUN: 30 mg/dL — ABNORMAL HIGH (ref 8–23)
CO2: 29 mmol/L (ref 22–32)
Calcium: 9 mg/dL (ref 8.9–10.3)
Chloride: 97 mmol/L — ABNORMAL LOW (ref 98–111)
Creatinine, Ser: 0.73 mg/dL (ref 0.44–1.00)
GFR calc Af Amer: >60 mL/min (ref >60)
GFR calc non Af Amer: >60 mL/min (ref >60)
Glucose, Bld: 132 mg/dL — ABNORMAL HIGH (ref 70–99)
POTASSIUM: 3 mmol/L — AB (ref 3.5–5.1)
Sodium: 136 mmol/L (ref 135–145)
TOTAL PROTEIN: 6.7 g/dL (ref 6.5–8.1)

## 2018-10-30 LAB — MAGNESIUM: Magnesium: 2.1 mg/dL (ref 1.7–2.4)

## 2018-10-30 LAB — GLUCOSE, CAPILLARY
GLUCOSE-CAPILLARY: 97 mg/dL (ref 70–99)
Glucose-Capillary: 110 mg/dL — ABNORMAL HIGH (ref 70–99)
Glucose-Capillary: 96 mg/dL (ref 70–99)

## 2018-10-30 LAB — PHOSPHORUS: Phosphorus: 4.3 mg/dL (ref 2.5–4.6)

## 2018-10-30 MED ORDER — TRAMADOL HCL 50 MG PO TABS
100.0000 mg | ORAL_TABLET | Freq: Once | ORAL | Status: AC
  Administered 2018-10-30: 100 mg via ORAL
  Filled 2018-10-30: qty 2

## 2018-10-30 MED ORDER — LORAZEPAM 2 MG/ML IJ SOLN: 1.0000 mg | Freq: Four times a day (QID) | INTRAMUSCULAR | Status: DC | PRN

## 2018-10-30 MED ORDER — TRAMADOL HCL 50 MG PO TABS
100.0000 mg | ORAL_TABLET | Freq: Four times a day (QID) | ORAL | Status: DC | PRN
  Administered 2018-10-31 – 2018-11-01 (×3): 100 mg via ORAL
  Filled 2018-10-30 (×3): qty 2

## 2018-10-30 MED ORDER — ETOMIDATE 2 MG/ML IV SOLN: INTRAVENOUS | Status: AC | PRN

## 2018-10-30 MED ORDER — TRACE MINERALS CR-CU-MN-SE-ZN 10-1000-500-60 MCG/ML IV SOLN: INTRAVENOUS | Status: DC

## 2018-10-30 MED ORDER — ONDANSETRON 4 MG PO TBDP: 4.0000 mg | ORAL_TABLET | Freq: Four times a day (QID) | ORAL | Status: DC | PRN

## 2018-10-30 MED ORDER — VENLAFAXINE HCL 37.5 MG PO TABS
75.0000 mg | ORAL_TABLET | Freq: Two times a day (BID) | ORAL | Status: DC
  Administered 2018-10-30 – 2018-11-01 (×5): 75 mg via ORAL
  Filled 2018-10-30 (×5): qty 2

## 2018-10-30 MED ORDER — INSULIN ASPART 100 UNIT/ML ~~LOC~~ SOLN: 0.0000 [IU] | Freq: Four times a day (QID) | SUBCUTANEOUS | Status: DC

## 2018-10-30 MED ORDER — LEVOTHYROXINE SODIUM 50 MCG PO TABS
25.0000 ug | ORAL_TABLET | Freq: Every day | ORAL | Status: DC
  Administered 2018-10-31 – 2018-11-01 (×2): 25 ug via ORAL
  Filled 2018-10-30 (×2): qty 1

## 2018-10-30 MED ORDER — ACETAMINOPHEN 500 MG PO TABS
1000.0000 mg | ORAL_TABLET | Freq: Four times a day (QID) | ORAL | Status: DC
  Administered 2018-10-30 – 2018-11-01 (×4): 1000 mg via ORAL
  Filled 2018-10-30 (×4): qty 2

## 2018-10-30 MED ORDER — SODIUM CHLORIDE 0.9 % IV SOLN
INTRAVENOUS | Status: AC | PRN
  Administered 2018-10-30: 1000 mL via INTRAVENOUS

## 2018-10-30 MED ORDER — MORPHINE SULFATE (PF) 2 MG/ML IV SOLN
2.0000 mg | INTRAVENOUS | Status: DC | PRN
  Administered 2018-11-01: 2 mg via INTRAVENOUS
  Filled 2018-10-30: qty 1

## 2018-10-30 MED ORDER — LOPERAMIDE HCL 2 MG PO CAPS
4.0000 mg | ORAL_CAPSULE | Freq: Four times a day (QID) | ORAL | Status: DC
  Administered 2018-10-30 – 2018-11-01 (×8): 4 mg via ORAL
  Filled 2018-10-30 (×8): qty 2

## 2018-10-30 MED ORDER — FAT EMULSION PLANT BASED 20 % IV EMUL: 250.0000 mL | INTRAVENOUS | Status: DC

## 2018-10-30 MED ORDER — MORPHINE SULFATE (PF) 2 MG/ML IV SOLN
2.0000 mg | INTRAVENOUS | Status: AC
  Administered 2018-10-30: 2 mg via INTRAVENOUS
  Filled 2018-10-30: qty 1

## 2018-10-30 MED ORDER — TRACE MINERALS CR-CU-MN-SE-ZN 10-1000-500-60 MCG/ML IV SOLN
INTRAVENOUS | Status: AC
  Administered 2018-10-30: 18:00:00 via INTRAVENOUS
  Filled 2018-10-30: qty 1992

## 2018-10-30 MED ORDER — SUCCINYLCHOLINE CHLORIDE 20 MG/ML IJ SOLN: INTRAMUSCULAR | Status: AC | PRN

## 2018-10-30 MED ORDER — INFLUENZA VAC SPLIT HIGH-DOSE 0.5 ML IM SUSY
0.5000 mL | PREFILLED_SYRINGE | INTRAMUSCULAR | Status: DC
  Filled 2018-10-30 (×2): qty 0.5

## 2018-10-30 MED ORDER — SODIUM CHLORIDE 0.9 % IV BOLUS
1000.0000 mL | Freq: Once | INTRAVENOUS | Status: AC
  Administered 2018-10-30: 1000 mL via INTRAVENOUS

## 2018-10-30 MED ORDER — LACTATED RINGERS IV SOLN
INTRAVENOUS | Status: DC
  Administered 2018-10-30 – 2018-10-31 (×3): via INTRAVENOUS

## 2018-10-30 MED ORDER — ONDANSETRON HCL 4 MG/2ML IJ SOLN
4.0000 mg | Freq: Once | INTRAMUSCULAR | Status: AC
  Administered 2018-10-30: 4 mg via INTRAVENOUS
  Filled 2018-10-30: qty 2

## 2018-10-30 MED ORDER — PANTOPRAZOLE SODIUM 40 MG IV SOLR
40.0000 mg | Freq: Two times a day (BID) | INTRAVENOUS | Status: DC
  Administered 2018-10-30 – 2018-11-01 (×4): 40 mg via INTRAVENOUS
  Filled 2018-10-30 (×4): qty 40

## 2018-10-30 MED ORDER — ARIPIPRAZOLE 2 MG PO TABS
2.0000 mg | ORAL_TABLET | Freq: Every day | ORAL | Status: DC
  Administered 2018-10-31 – 2018-11-01 (×2): 2 mg via ORAL
  Filled 2018-10-30 (×2): qty 1

## 2018-10-30 MED ORDER — FAT EMULSION PLANT BASED 20 % IV EMUL
250.0000 mL | INTRAVENOUS | Status: AC
  Administered 2018-10-30: 250 mL via INTRAVENOUS
  Filled 2018-10-30: qty 250

## 2018-10-30 MED ORDER — LORAZEPAM 2 MG/ML IJ SOLN
INTRAMUSCULAR | Status: AC
  Filled 2018-10-30: qty 1

## 2018-10-30 MED ORDER — ENOXAPARIN SODIUM 40 MG/0.4ML ~~LOC~~ SOLN
40.0000 mg | SUBCUTANEOUS | Status: DC
  Administered 2018-10-30 – 2018-10-31 (×2): 40 mg via SUBCUTANEOUS
  Filled 2018-10-30 (×2): qty 0.4

## 2018-10-30 MED ORDER — LORAZEPAM 2 MG/ML IJ SOLN
2.0000 mg | Freq: Once | INTRAMUSCULAR | Status: AC
  Administered 2018-10-30: 2 mg via INTRAVENOUS

## 2018-10-30 NOTE — H&P (Signed)
Patient ID: Cassie Carlson, female   DOB: 11-01-1945, 73 y.o.   MRN: [id number]  HPI [name] is a 73 y.o. female well-known to our surgical department.  He is status post transverse colectomy complicated by anastomotic leak requiring diverging loop colostomy and wound VAC placement.  Subsequently developed an entero-atmospheric fistula that has been managed with an Systems developer system.  She comes today due to massive leakage of enteric content and more severe pain due to skin irritation.  She is very anxious.  No fevers no chills no other changes on her baseline status. CBC and BMP were normal only slight hemoconcentration on the hemoglobin.  HPI  Past Medical History:  Diagnosis Date  . Anxiety   . Arthritis   . Dementia ([place])   . Depression   . Family history of adverse reaction to anesthesia    "siister had issues with heart racing during lung biopsy"  . Family history of breast cancer   . Family history of colon cancer   . Family history of colon cancer   . Family history of prostate cancer   . Family history of prostate cancer   . Fibroids    excessive vaginal bleeding  . GERD (gastroesophageal reflux disease)   . Headache   . Hypothyroidism   . PONV (postoperative nausea and vomiting)   . Sleep apnea     Past Surgical History:  Procedure Laterality Date  . ABDOMINAL HYSTERECTOMY    . ANTERIOR CERVICAL DECOMP/DISCECTOMY FUSION N/A 01/01/2015   Procedure: ANTERIOR CERVICAL DECOMPRESSION/DISCECTOMY FUSION CERVICAL FIVE-SIX,CERVICAL SIX-SEVEN;  Surgeon: Karie Chimera, MD;  Location: [facility];  Service: Neurosurgery;  Laterality: N/A;  . AUGMENTATION MAMMAPLASTY Bilateral 1990  . BREAST SURGERY    . CHOLECYSTECTOMY    . COLON RESECTION N/A 08/22/2018   Procedure: COLON RESECTION LAPAROSCOPIC;  Surgeon: Benjamine Sprague, DO;  Location: [facility] ORS;  Service: General;  Laterality: N/A;  . COLONOSCOPY WITH PROPOFOL N/A 06/14/2018   Procedure: COLONOSCOPY WITH PROPOFOL;   Surgeon: Lollie Sails, MD;  Location: [facility] ENDOSCOPY;  Service: Endoscopy;  Laterality: N/A;  . DILATION AND CURETTAGE OF UTERUS    . ESOPHAGOGASTRODUODENOSCOPY (EGD) WITH PROPOFOL N/A 06/14/2018   Procedure: ESOPHAGOGASTRODUODENOSCOPY (EGD) WITH PROPOFOL;  Surgeon: Lollie Sails, MD;  Location: [facility] ENDOSCOPY;  Service: Endoscopy;  Laterality: N/A;  . EUS  08/18/2012   Procedure: UPPER ENDOSCOPIC ULTRASOUND (EUS) LINEAR;  Surgeon: Milus Banister, MD;  Location: [facility] ENDOSCOPY;  Service: Endoscopy;  Laterality: N/A;  . ILEOSTOMY N/A 09/06/2018   Procedure: ILEOSTOMY;  Surgeon: Benjamine Sprague, DO;  Location: [facility] ORS;  Service: General;  Laterality: N/A;  . LAPAROTOMY N/A 09/06/2018   Procedure: EXPLORATORY LAPAROTOMY;  Surgeon: Benjamine Sprague, DO;  Location: [facility] ORS;  Service: General;  Laterality: N/A;  . LAPAROTOMY N/A 09/03/2018   Procedure: EXPLORATORY LAPAROTOMY;  Surgeon: Benjamine Sprague, DO;  Location: [facility] ORS;  Service: General;  Laterality: N/A;  . REDUCTION MAMMAPLASTY Bilateral 1990  . SHOULDER ARTHROSCOPY Left   . VEIN LIGATION AND STRIPPING      Family History  Problem Relation Age of Onset  . Breast cancer Mother 48  . Lung cancer Mother   . Prostate cancer Father        dx 78's  . Bladder Cancer Father   . Colon cancer Father 24  . Throat cancer Sister   . Lung cancer Sister   . Lung cancer Brother        agent orange, hx of  smoking  . Brain cancer Brother   . Leukemia Paternal Aunt   . Prostate cancer Paternal Grandfather 61  . Lymphoma Other 35    Social History Social History   Tobacco Use  . Smoking status: Former Smoker    Packs/day: 2.00    Years: 5.00    Pack years: 10.00    Types: Cigarettes    Last attempt to quit: 10/19/1965    Years since quitting: 53.0  . Smokeless tobacco: Never Used  Substance Use Topics  . Alcohol use: No  . Drug use: Never    Allergies  Allergen Reactions  . Asa [Aspirin] Other (See Comments)    GI  distress/irritaion  . Codeine Other (See Comments)    "Face turned red like a sunburn"  . Ibuprofen Other (See Comments)    GI distress/irritation  . Nsaids Other (See Comments)    GI distress/irritation  . Other Itching    CHG wipes    Current Facility-Administered Medications  Medication Dose Route Frequency Provider Last Rate Last Dose  . LORazepam (ATIVAN) 2 MG/ML injection            Current Outpatient Medications  Medication Sig Dispense Refill  . acetaminophen (TYLENOL) 500 MG tablet Take 2 tablets (1,000 mg total) by mouth every 6 (six) hours. 30 tablet 0  . ARIPiprazole (ABILIFY) 2 MG tablet Take 1 tablet (2 mg total) by mouth daily. 30 tablet 2  . cholestyramine (QUESTRAN) 4 g packet Take 1 packet (4 g total) by mouth 3 (three) times daily. 60 each 12  . hydrocortisone cream 1 % Apply topically 3 (three) times daily as needed for itching. 30 g 0  . levothyroxine (SYNTHROID, LEVOTHROID) 25 MCG tablet Take 25 mcg by mouth daily before breakfast.    . loperamide (IMODIUM) 2 MG capsule Take 2 capsules (4 mg total) by mouth 4 (four) times daily. 30 capsule 0  . ondansetron (ZOFRAN-ODT) 4 MG disintegrating tablet Take 1 tablet (4 mg total) by mouth every 6 (six) hours as needed for nausea. 20 tablet 0  . RABEprazole (ACIPHEX) 20 MG tablet Take 20 mg by mouth 2 (two) times daily.    . ranitidine (ZANTAC) 150 MG capsule Take 150 mg by mouth 2 (two) times daily.     . sucralfate (CARAFATE) 1 g tablet Take 1 tablet (1 g total) by mouth 2 (two) times daily as needed. 30 tablet 0  . venlafaxine (EFFEXOR) 75 MG tablet Take 1 tablet (75 mg total) by mouth 2 (two) times daily with a meal. 180 tablet 0     Review of Systems Full ROS  was asked and was negative except for the information on the HPI  Physical Exam Blood pressure 107/67, pulse (!) 112, temperature 97.7 F (36.5 C), temperature source Oral, resp. rate 18, height 5\' 3"  (1.6 m), weight 78.5 kg, SpO2 95 %. CONSTITUTIONAL:  NAD EYES: Pupils are equal, round, and reactive to light, Sclera are non-icteric. EARS, NOSE, MOUTH AND THROAT: The oropharynx is clear. The oral mucosa is pink and moist. Hearing is intact to voice. LYMPH NODES:  Lymph nodes in the neck are normal. RESPIRATORY:  Lungs are clear. There is normal respiratory effort, with equal breath sounds bilaterally, and without pathologic use of accessory muscles. CARDIOVASCULAR: Heart is regular without murmurs, gallops, or rubs. GI: The abdomen is  soft, nontender, and nondistended. Eakin pouch leaking. I removed it, Visualized the afferent and efferent loops. Also visualized an additional cranial loop c/w colon.  Loop ileostomy patent. Denuded beefy skin around wound ( irritation from bile). No peritonitis MUSCULOSKELETAL: Normal muscle strength and tone. No cyanosis or edema.   SKIN: Turgor is good and there are no pathologic skin lesions or ulcers. NEUROLOGIC: Motor and sensation is grossly normal. Cranial nerves are grossly intact. PSYCH:  Oriented to person, place and time. Affect is normal.  Data Reviewed  I have personally reviewed the patient's imaging, laboratory findings and medical records.    Assessment/Plan 73 year old female with interhemispheric fistula and complex wound care now comes in with severe pain and dehydration lately related to increased loss from ostomy. We Will admit to the hospital place her on additional IV fluids, TPN.  I have personally changed the Eakin pouch. Took me about 1 hour of my total time.  I was able to first remove the old 1.  Remove all the adhesive from the abdominal wall.  There was significant denuded and erythema around the edges of the wound.  I used a layer of Dermabond circumferentially then used Mastisol and stoma paste.  Then use an additional layer of Mastisol and placed the Eakin pouch.  I was able to obtain a perfect sealed circumferentially. ExTensive counseling provided to the patient and to the  daughter   Time spent with the patient was 90 minutes, with more than 50% of the time spent in face-to-face education, counseling and care coordination.     Caroleen Hamman, MD FACS General Surgeon 10/30/2018, 1:54 PM

## 2018-10-30 NOTE — Progress Notes (Signed)
Initiate Nutrition Assessment   DOCUMENTATION CODES:   Severe malnutrition in context of acute illness/injury  INTERVENTION:   Initiate Clinimix 5/15 with electrolytes at goal rate of 41m/hr   Initiate 20% lipids '@20ml'$ /hr x 12 hrs/day   Regimen @ goal rate provides 1894kcal/day, 100g/day protein, 22360mvolume    Add MVI daily and trace elements daily    Add vitamin C '500mg'$  daily     Daily weights   NUTRITION DIAGNOSIS:   Severe Malnutrition related to acute illness(complications after surgery including abdominal wound and fistula ) as evidenced by 8 percent weight loss in 2 months, severe fat depletion in orbital region, moderate muscle depletions in BLE  GOAL:   Patient will meet greater than or equal to 90% of their needs  -met with TPN  MONITOR:   Labs, Weight trends, Skin, I & O's, Other (Comment)(TPN)  ASSESSMENT:   [age]/o female with h/o transverse colectomy for unresectable colon polyp on 08/23/18 recent admission for incisional abscess and wound dehiscence requiring exploratory laparotomy incisional abscess washout, primary closure of anastomosis leak, placement of phasix mesh 11/17; Pt now with entero-atmospheric fistula admitted with uncontrollable high output    Pt well known to nutrition department; has been followed by this RD during each of her hospital admits. Pt recently discharged on 1/6; pt discharged to home on TPN. Pt with numerous complications after surgery; requires TPN to support wound healing and fistula output. Pt has lost 16lbs(8%) over the past two months after surgery and has developed severe acute malnutrition. No new weight documented this admit to determine if any weight changes since discharge. Pt remained weight stable on TPN during last admit. Recommend continue TPN as previously described. RD will follow up with exam and obtain recent history once pt admitted onto the floor.    Medications reviewed  Labs reviewed: K 3.0(L), Cl 97(L), BUN  30(H) Hgb 10.7(L), Hct 33.5(L)  Diet Order:   Diet Order    None     EDUCATION NEEDS:   Education needs have been addressed  Skin:  Skin Assessment: Reviewed RN Assessment(ecchymosis, abdominal wound )  Last BM:  pta  Height:   Ht Readings from Last 1 Encounters:  10/30/18 '5\' 3"'$  (1.6 m)    Weight:   Wt Readings from Last 1 Encounters:  10/30/18 78.5 kg    Ideal Body Weight:  52.3 kg  BMI:  Body mass index is 30.66 kg/m.  Estimated Nutritional Needs:   Kcal:  1700-1900kcal/day   Protein:  84-94g/day   Fluid:  >1.4L/day   CaKoleen DistanceS, RD, LDN Pager #- [phone]#- [phone] Hours Pager: [phone]

## 2018-10-30 NOTE — ED Notes (Signed)
Pt sleeping soundly.

## 2018-10-30 NOTE — ED Notes (Signed)
Pt had colectomy in November and then developed a fistula in December and was readmitted to hospital - she was just released from hospital last week - she returns today for excessive drainage from fistula site - daughter reports that pt had dressing changes x4 yesterday and still unable to contain drainage

## 2018-10-30 NOTE — Progress Notes (Signed)
PHARMACY - ADULT TOTAL PARENTERAL NUTRITION CONSULT NOTE   Pharmacy Consult for TPN Indication: Malnutrition. Pt on home TPN  Patient Measurements: Height: 5\' 3"  (160 cm) Weight: 173 lb 1.6 oz (78.5 kg) IBW/kg (Calculated) : 52.4 TPN AdjBW (KG): 58.9 Body mass index is 30.66 kg/m. Usual Weight: 78.5 kg   Assessment:  73 y/o female with h/o transverse colectomy for unresectable colon polyp on 08/23/18 recent admission for incisional abscess and wound dehiscence requiring exploratory laparotomy incisional abscess washout, primary closure of anastomosis leak, placement of phasix mesh 11/17; pt now admitted for abdominal pain, stool in JP drain and wound dehiscence.  8 percent weight loss in 2 months, severe fat depletion in orbital region, moderate muscle depletions in BLE. Will continue the same regimen from last admission.    Endo: BG in wnl  Insulin requirements in the past 24 hours: none Lytes: K+ 3.0. all other labs WNL.  Renal: at baseline Scr  Pulm: on room air Cards: Negative for chest pain Hepatobil: Neuro:Negative for headaches, weakness or numbness.  TPN Access: PICC TPN start date: 10/30/18 Nutritional Goals (per RD recommendation on 10/30/18): KCal: 1700-1900 kcal/day  Protein: 84-94 g/day Fluid:> 1.4L/day   Goal TPN rate is 83 ml/hr (provides 90% of her requirement)  Current Nutrition: none noted   Plan:  Continue Clinimix E 5/15 TPN at goal rate of 83 mL/hr + 20% lipids at 25mL/hr Regimen @ goal rate provides 1894kcal/day, 100g/day protein, 2247ml volume  Additional Electrolytes in TPN: none Add MVI, trace elements, vitC 500 mg Continue q6h CBGS and sensitive SSI and adjust as needed F/U electrolytes 10/31/2018   Oswald Hillock, PharmD, BCPS Clinical Pharmacist 10/30/2018,3:18 PM

## 2018-10-30 NOTE — ED Triage Notes (Signed)
Pt via pov from  Home; had recent colon surgery (November), developed fistula at snf, and was readmitted to hospital, released on 10/24/2018. Pt has been having "out of control" production from fistula; Dr. Dahlia Byes, surgeon on call, told family to bring pt to ED. Pt c/o burning to site of fistula. Pt alert & oriented, nad noted.

## 2018-10-30 NOTE — ED Provider Notes (Signed)
[facility] Emergency Department Provider Note  ____________________________________________  Time seen: Approximately 12:06 PM  I have reviewed the triage vital signs and the nursing notes.   HISTORY  Chief Complaint Post-op Problem   HPI Cassie Carlson is a 73 y.o. female with a history of colon resection with several complications including enterocutaneous fistula, dehiscence, and anastomotic leak who presents for increased output from the fistula and abdominal pain.  Patient was discharged from the hospital 6 days ago and since then has had increase of the fistula output roughly a liter a day.  The liquid is bilious.  She also has had abdominal pain that she describes as sharp needles around the fistula site.  She saw her surgeon Dr. Lysle Pearl 4 days ago and had a repeat CT which did not show any acute pathology.  Today they called Dr. Dahlia Byes and were instructed to come to the emergency room for evaluation.  Patient reports that her pain is 8 out of 10.  She has been taking 50 mg of tramadol with no significant relief.  The pain is constant and nonradiating.  She has had nausea and several daily episodes of nonbloody nonbilious emesis at least for 3 days.  No fever or chills.  No chest pain or shortness of breath.  Past Medical History:  Diagnosis Date  . Anxiety   . Arthritis   . Dementia ([place])   . Depression   . Family history of adverse reaction to anesthesia    "siister had issues with heart racing during lung biopsy"  . Family history of breast cancer   . Family history of colon cancer   . Family history of colon cancer   . Family history of prostate cancer   . Family history of prostate cancer   . Fibroids    excessive vaginal bleeding  . GERD (gastroesophageal reflux disease)   . Headache   . Hypothyroidism   . PONV (postoperative nausea and vomiting)   . Sleep apnea     Patient Active Problem List   Diagnosis Date Noted  . Dehydration  10/30/2018  . 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 Protein-calorie malnutrition, severe 10/05/2018  . 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 Postprocedural intraabdominal abscess   . Large intestine anastomotic leak   . Dehiscence of fascia   . Wound infection after surgery 09/04/2018  . Colon cancer ([place]) 08/22/2018  . Genetic testing 07/29/2018  . Polyposis of colon 07/20/2018  . Family history of breast cancer   . Family history of colon cancer   . Family history of prostate cancer   . Cervical radiculopathy 01/01/2015  . Nonspecific (abnormal) findings on radiological and other examination of gastrointestinal tract 08/18/2012    Past Surgical History:  Procedure Laterality Date  . ABDOMINAL HYSTERECTOMY    . ANTERIOR CERVICAL DECOMP/DISCECTOMY FUSION N/A 01/01/2015   Procedure: ANTERIOR CERVICAL DECOMPRESSION/DISCECTOMY FUSION CERVICAL FIVE-SIX,CERVICAL SIX-SEVEN;  Surgeon: Karie Chimera, MD;  Location: [facility];  Service: Neurosurgery;  Laterality: N/A;  . AUGMENTATION MAMMAPLASTY Bilateral 1990  . BREAST SURGERY    . CHOLECYSTECTOMY    . COLON RESECTION N/A 08/22/2018   Procedure: COLON RESECTION LAPAROSCOPIC;  Surgeon: Benjamine Sprague, DO;  Location: [facility] ORS;  Service: General;  Laterality: N/A;  . COLONOSCOPY WITH PROPOFOL N/A 06/14/2018   Procedure: COLONOSCOPY WITH PROPOFOL;  Surgeon: Lollie Sails, MD;  Location: [facility] ENDOSCOPY;  Service: Endoscopy;  Laterality: N/A;  . DILATION AND CURETTAGE OF UTERUS    .  ESOPHAGOGASTRODUODENOSCOPY (EGD) WITH PROPOFOL N/A 06/14/2018   Procedure: ESOPHAGOGASTRODUODENOSCOPY (EGD) WITH PROPOFOL;  Surgeon: Lollie Sails, MD;  Location: [facility] ENDOSCOPY;  Service: Endoscopy;  Laterality: N/A;  . EUS  08/18/2012   Procedure: UPPER ENDOSCOPIC ULTRASOUND (EUS) LINEAR;  Surgeon: Milus Banister, MD;  Location: [facility] ENDOSCOPY;  Service: Endoscopy;  Laterality: N/A;  . ILEOSTOMY N/A 09/06/2018   Procedure:  ILEOSTOMY;  Surgeon: Benjamine Sprague, DO;  Location: [facility] ORS;  Service: General;  Laterality: N/A;  . LAPAROTOMY N/A 09/06/2018   Procedure: EXPLORATORY LAPAROTOMY;  Surgeon: Benjamine Sprague, DO;  Location: [facility] ORS;  Service: General;  Laterality: N/A;  . LAPAROTOMY N/A 09/03/2018   Procedure: EXPLORATORY LAPAROTOMY;  Surgeon: Benjamine Sprague, DO;  Location: [facility] ORS;  Service: General;  Laterality: N/A;  . REDUCTION MAMMAPLASTY Bilateral 1990  . SHOULDER ARTHROSCOPY Left   . VEIN LIGATION AND STRIPPING      Prior to Admission medications   Medication Sig Start Date End Date Taking? Authorizing Provider  acetaminophen (TYLENOL) 500 MG tablet Take 2 tablets (1,000 mg total) by mouth every 6 (six) hours. 10/25/18  Yes Sakai, Isami, DO  ARIPiprazole (ABILIFY) 2 MG tablet Take 1 tablet (2 mg total) by mouth daily. 10/24/18 01/22/19 Yes Sakai, Isami, DO  cholestyramine (QUESTRAN) 4 g packet Take 1 packet (4 g total) by mouth 3 (three) times daily. 10/24/18  Yes Sakai, Isami, DO  hydrocortisone cream 1 % Apply topically 3 (three) times daily as needed for itching. 10/24/18  Yes Sakai, Isami, DO  levothyroxine (SYNTHROID, LEVOTHROID) 25 MCG tablet Take 25 mcg by mouth daily before breakfast.   Yes [provider]  loperamide (IMODIUM) 2 MG capsule Take 2 capsules (4 mg total) by mouth 4 (four) times daily. 10/24/18  Yes Sakai, Isami, DO  ondansetron (ZOFRAN-ODT) 4 MG disintegrating tablet Take 1 tablet (4 mg total) by mouth every 6 (six) hours as needed for nausea. 10/24/18  Yes Sakai, Isami, DO  ranitidine (ZANTAC) 150 MG capsule Take 150 mg by mouth 2 (two) times daily.    Yes [provider]  sucralfate (CARAFATE) 1 g tablet Take 1 tablet (1 g total) by mouth 2 (two) times daily as needed. 10/24/18  Yes Sakai, Isami, DO  venlafaxine (EFFEXOR) 75 MG tablet Take 1 tablet (75 mg total) by mouth 2 (two) times daily with a meal. 10/24/18 01/22/19 Yes Sakai, Isami, DO  RABEprazole (ACIPHEX) 20 MG tablet Take 20  mg by mouth 2 (two) times daily.    [provider]    Allergies Asa [aspirin]; Codeine; Ibuprofen; Nsaids; and Other  Family History  Problem Relation Age of Onset  . Breast cancer Mother 66  . Lung cancer Mother   . Prostate cancer Father        dx 50's  . Bladder Cancer Father   . Colon cancer Father 86  . Throat cancer Sister   . Lung cancer Sister   . Lung cancer Brother        agent orange, hx of smoking  . Brain cancer Brother   . Leukemia Paternal Aunt   . Prostate cancer Paternal Grandfather 36  . Lymphoma Other 35    Social History Social History   Tobacco Use  . Smoking status: Former Smoker    Packs/day: 2.00    Years: 5.00    Pack years: 10.00    Types: Cigarettes    Last attempt to quit: 10/19/1965    Years since quitting: 53.0  . Smokeless tobacco:  Never Used  Substance Use Topics  . Alcohol use: No  . Drug use: Never    Review of Systems  Constitutional: Negative for fever. Eyes: Negative for visual changes. ENT: Negative for sore throat. Neck: No neck pain  Cardiovascular: Negative for chest pain. Respiratory: Negative for shortness of breath. Gastrointestinal: + abdominal pain, nausea, and vomiting. No diarrhea. Genitourinary: Negative for dysuria. Musculoskeletal: Negative for back pain. Skin: Negative for rash. Neurological: Negative for headaches, weakness or numbness. Psych: No SI or HI  ____________________________________________   PHYSICAL EXAM:  VITAL SIGNS: ED Triage Vitals [10/30/18 1015]  Enc Vitals Group     BP 107/67     Pulse Rate (!) 112     Resp 18     Temp 97.7 F (36.5 C)     Temp Source Oral     SpO2 95 %     Weight 173 lb 1.6 oz (78.5 kg)     Height 5\' 3"  (1.6 m)     Head Circumference      Peak Flow      Pain Score 6     Pain Loc      Pain Edu?      Excl. in [place]?     Constitutional: Alert and oriented. Well appearing and in no apparent distress. HEENT:      Head: Normocephalic and  atraumatic.         Eyes: Conjunctivae are normal. Sclera is non-icteric.       Mouth/Throat: Mucous membranes are moist.       Neck: Supple with no signs of meningismus. Cardiovascular: Tachycardic with regular rhythm. No murmurs, gallops, or rubs. 2+ symmetrical distal pulses are present in all extremities. No JVD. Respiratory: Normal respiratory effort. Lungs are clear to auscultation bilaterally. No wheezes, crackles, or rhonchi.  Gastrointestinal: Soft, diffusely tender to palpation, draining fistula with roughly 500cc in the bag, non distended with positive bowel sounds. No rebound or guarding. Genitourinary: No CVA tenderness. Musculoskeletal: Nontender with normal range of motion in all extremities. No edema, cyanosis, or erythema of extremities. Neurologic: Normal speech and language. Face is symmetric. Moving all extremities. No gross focal neurologic deficits are appreciated. Skin: Skin is warm, dry and intact. No rash noted. Psychiatric: Mood and affect are normal. Speech and behavior are normal.  ____________________________________________   LABS (all labs ordered are listed, but only abnormal results are displayed)  Labs Reviewed  CBC WITH DIFFERENTIAL/PLATELET - Abnormal; Notable for the following components:      Result Value   RBC 3.59 (*)    Hemoglobin 10.7 (*)    HCT 33.5 (*)    All other components within normal limits  COMPREHENSIVE METABOLIC PANEL - Abnormal; Notable for the following components:   Potassium 3.0 (*)    Chloride 97 (*)    Glucose, Bld 132 (*)    BUN 30 (*)    Albumin 3.2 (*)    AST 46 (*)    All other components within normal limits  GLUCOSE, CAPILLARY - Abnormal; Notable for the following components:   Glucose-Capillary 110 (*)    All other components within normal limits  MAGNESIUM  PHOSPHORUS  GLUCOSE, CAPILLARY  PREALBUMIN  CBC  COMPREHENSIVE METABOLIC PANEL  PROTIME-INR  MAGNESIUM  PHOSPHORUS  TRIGLYCERIDES    ____________________________________________  EKG  none  ____________________________________________  RADIOLOGY  I have personally reviewed the images performed during this visit and I agree with the Radiologist's read.   Interpretation by Radiologist:  Dg Abdomen  1 View  Result Date: 10/30/2018 CLINICAL DATA:  Abdominal pain. Prior abdominal surgeries. Right lower quadrant ostomy mid abdominal anastomosis. EXAM: ABDOMEN - 1 VIEW COMPARISON:  None. FINDINGS: Bowel gas pattern is normal. No obstruction is present. No definite free air is present on the supine images. Surgical clips are present at gallbladder fossa. IMPRESSION: Normal bowel gas pattern. Electronically Signed   By: San Morelle M.D.   On: 10/30/2018 15:09      ____________________________________________   PROCEDURES  Procedure(s) performed: None Procedures Critical Care performed:  None ____________________________________________   INITIAL IMPRESSION / ASSESSMENT AND PLAN / ED COURSE   73 y.o. female with a history of colon resection with several complications including enterocutaneous fistula, dehiscence, and anastomotic leak who presents for increased output from the fistula and abdominal pain.  Patient instructed to present to the emergency room at the request of her surgeon for admission for management of her fistula output.  Patient has diffuse abdominal tenderness with no rebound or guarding, no distention.  Had a CT scan done 4 days ago showing no acute complications.  Discussed with Dr. Dahlia Byes who requested a KUB but declined repeat CT at this time.  Basic labs are pending.  He is currently at bedside evaluating patient for admission. Will give IVF and zofran.  Offered IV narcotics however daughter is concerned because in the past patient has had hallucinations and confusions with those and she has requested a higher dose of tramadol.  Will give 100 mg of tramadol for pain.      As part of my  medical decision making, I reviewed the following data within the [place] History obtained from family, Nursing notes reviewed and incorporated, Labs reviewed , Old chart reviewed, Discussed with admitting physician , A consult was requested and obtained from this/these consultant(s) Surgery, Notes from prior ED visits and [place] Controlled Substance Database    Pertinent labs & imaging results that were available during my care of the patient were reviewed by me and considered in my medical decision making (see chart for details).    ____________________________________________   FINAL CLINICAL IMPRESSION(S) / ED DIAGNOSES  Final diagnoses:  Generalized abdominal pain  Enterocutaneous fistula      NEW MEDICATIONS STARTED DURING THIS VISIT:  ED Discharge Orders    None       Note:  This document was prepared using Dragon voice recognition software and may include unintentional dictation errors.    Alfred Levins, [place], MD 10/30/18 2023

## 2018-10-31 LAB — COMPREHENSIVE METABOLIC PANEL
ALT: 40 U/L (ref 0–44)
AST: 44 U/L — ABNORMAL HIGH (ref 15–41)
Albumin: 2.8 g/dL — ABNORMAL LOW (ref 3.5–5.0)
Alkaline Phosphatase: 85 U/L (ref 38–126)
Anion gap: 7 (ref 5–15)
BUN: 29 mg/dL — ABNORMAL HIGH (ref 8–23)
CHLORIDE: 101 mmol/L (ref 98–111)
CO2: 28 mmol/L (ref 22–32)
Calcium: 8.5 mg/dL — ABNORMAL LOW (ref 8.9–10.3)
Creatinine, Ser: 0.59 mg/dL (ref 0.44–1.00)
GFR calc Af Amer: >60 mL/min (ref >60)
GFR calc non Af Amer: >60 mL/min (ref >60)
Glucose, Bld: 92 mg/dL (ref 70–99)
Potassium: 3.3 mmol/L — ABNORMAL LOW (ref 3.5–5.1)
Sodium: 136 mmol/L (ref 135–145)
Total Bilirubin: 0.4 mg/dL (ref 0.3–1.2)
Total Protein: 5.9 g/dL — ABNORMAL LOW (ref 6.5–8.1)

## 2018-10-31 LAB — CBC
HCT: 30 % — ABNORMAL LOW (ref 36.0–46.0)
Hemoglobin: 9.5 g/dL — ABNORMAL LOW (ref 12.0–15.0)
MCH: 30.1 pg (ref 26.0–34.0)
MCHC: 31.7 g/dL (ref 30.0–36.0)
MCV: 94.9 fL (ref 80.0–100.0)
PLATELETS: 273 10*3/uL (ref 150–400)
RBC: 3.16 MIL/uL — ABNORMAL LOW (ref 3.87–5.11)
RDW: 13.4 % (ref 11.5–15.5)
WBC: 4.8 10*3/uL (ref 4.0–10.5)
nRBC: 0 % (ref 0.0–0.2)

## 2018-10-31 LAB — GLUCOSE, CAPILLARY
Glucose-Capillary: 89 mg/dL (ref 70–99)
Glucose-Capillary: 100 mg/dL — ABNORMAL HIGH (ref 70–99)
Glucose-Capillary: 104 mg/dL — ABNORMAL HIGH (ref 70–99)

## 2018-10-31 LAB — PROTIME-INR
INR: 1.12
Prothrombin Time: 14.3 seconds (ref 11.4–15.2)

## 2018-10-31 LAB — TRIGLYCERIDES: Triglycerides: 184 mg/dL — ABNORMAL HIGH (ref <150)

## 2018-10-31 LAB — MAGNESIUM: MAGNESIUM: 2 mg/dL (ref 1.7–2.4)

## 2018-10-31 LAB — PHOSPHORUS: Phosphorus: 4.4 mg/dL (ref 2.5–4.6)

## 2018-10-31 MED ORDER — POTASSIUM CHLORIDE CRYS ER 20 MEQ PO TBCR
40.0000 meq | EXTENDED_RELEASE_TABLET | Freq: Once | ORAL | Status: AC
  Administered 2018-10-31: 40 meq via ORAL
  Filled 2018-10-31: qty 2

## 2018-10-31 MED ORDER — FAT EMULSION PLANT BASED 20 % IV EMUL
250.0000 mL | INTRAVENOUS | Status: AC
  Administered 2018-10-31: 250 mL via INTRAVENOUS
  Filled 2018-10-31: qty 250

## 2018-10-31 MED ORDER — TRACE MINERALS CR-CU-MN-SE-ZN 10-1000-500-60 MCG/ML IV SOLN
INTRAVENOUS | Status: AC
  Administered 2018-10-31: 18:00:00 via INTRAVENOUS
  Filled 2018-10-31: qty 1992

## 2018-10-31 NOTE — Progress Notes (Signed)
PHARMACY - ADULT TOTAL PARENTERAL NUTRITION CONSULT NOTE   Pharmacy Consult for TPN Indication: Malnutrition. Pt on home TPN  Patient Measurements: Height: 5\' 3"  (160 cm) Weight: 173 lb 1.6 oz (78.5 kg) IBW/kg (Calculated) : 52.4 TPN AdjBW (KG): 58.9 Body mass index is 30.66 kg/m.  Assessment:  73 y/o female with h/o transverse colectomy for unresectable colon polyp on 08/23/18 recent admission for incisional abscess and wound dehiscence requiring exploratory laparotomy incisional abscess washout, primary closure of anastomosis leak, placement of phasix mesh 11/17; pt now admitted for abdominal pain, stool in JP drain and wound dehiscence.  8 percent weight loss in 2 months, severe fat depletion in orbital region, moderate muscle depletions in BLE. Will continue the same regimen from last admission.    Endo: BG wnl  Insulin requirements in the past 24 hours: none Lytes: K+ 3.3 replaced w/ oral KCl 36mEq. all other labs WNL.  Renal: at baseline Scr  Pulm: on room air Cards: Negative for chest pain Hepatobil: Neuro:Negative for headaches, weakness or numbness.  TPN Access: PICC TPN start date: 10/30/18 Nutritional Goals (per RD recommendation on 10/30/18): KCal: 1700-1900 kcal/day  Protein: 84-94 g/day Fluid:> 1.4L/day   Goal TPN rate is 83 ml/hr (provides 90% of her requirement)  Current Nutrition: none noted   Plan:  Continue Clinimix E 5/15 TPN at goal rate of 83 mL/hr + 20% lipids at 64mL/hr Regimen @ goal rate provides 1894kcal/day, 100g/day protein, 2221ml volume  Additional Electrolytes in TPN: none Add MVI, trace elements, vitC 500 mg Continue q6h CBGS and sensitive SSI and adjust as needed F/U electrolytes 11/01/2018   Dallie Piles, PharmD Clinical Pharmacist 10/31/2018,11:06 AM

## 2018-10-31 NOTE — Progress Notes (Addendum)
Initiate Nutrition Assessment   DOCUMENTATION CODES:   Severe malnutrition in context of acute illness/injury  INTERVENTION:   Continue Clinimix 5/15 with electrolytes at goal rate of 48m/hr   Continue 20% lipids '@20ml'$ /hr x 12 hrs/day   Regimen @ goal rate provides 1894kcal/day, 100g/day protein, 22341mvolume    Continue MVI and trace elements daily    Continue vitamin C '500mg'$  daily     Daily weights   NUTRITION DIAGNOSIS:   Severe Malnutrition related to acute illness(complications after surgery including abdominal wound and fistula ) as evidenced by 8 percent weight loss in 2 months, severe fat depletion in orbital region, moderate muscle depletions in BLE  GOAL:   Patient will meet greater than or equal to 90% of their needs  -met with TPN  MONITOR:   Labs, Weight trends, Skin, I & O's, Other (Comment)(TPN)  ASSESSMENT:   [age]/o female with h/o transverse colectomy for unresectable colon polyp on 08/23/18 recent admission for incisional abscess and wound dehiscence requiring exploratory laparotomy incisional abscess washout, primary closure of anastomosis leak, placement of phasix mesh 11/17; 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 Pt reports that she has been tolerating her TPN well at home. Pt reports that she is comfortable with the TPN and her daughter and home health nurse have been assisting her with changing out her TPN bag as well as her Eakin pouch and ostomy bag. Pt reports high output, ~100077mach time she changes the bag. Pt is unsure how often the bag is actually being changed. Pt's reports her incision is healing; states "it is getting smaller every day". Pt does not take any food or drink by mouth; except when taking medications. Pt weighed in bed today at 180.9lbs; requested pt have a standing weight next time she is up. Pt reports bilateral arm swelling at times; believes she is holding  fluid. Pt initiated on lactated ringers; is getting 3792m72mlume daily. Fistula output documented at 350ml23m will monitor weights and determine if any changes need to be made to TPN once standing weight is taken.   Medications reviewed: lovenox, insulin, synthroid, imodium, protonix, KCl, LRS '@75ml'$ /hr  Labs reviewed: K 3.3(L), P 4.4 wnl, Mg 2.0 wnl Prealbumin 31.5- 1/12 Triglycerides 184(H) Hgb 9.5(L), Hct 30.0(L) cbgs- 89, 100 x 24 hrs  Diet Order:   Diet Order    None     EDUCATION NEEDS:   Education needs have been addressed  Skin:  Skin Assessment: Reviewed RN Assessment(ecchymosis, abdominal wound ) 5.5 cm x 3 cm during last admit   Last BM:  1/13- small amount via ostomy  Height:   Ht Readings from Last 1 Encounters:  10/30/18 '5\' 3"'$  (1.6 m)    Weight:   Wt Readings from Last 1 Encounters:  10/31/18 82.1 kg    Ideal Body Weight:  52.3 kg  BMI:  Body mass index is 32.04 kg/m.  Estimated Nutritional Needs:   Kcal:  1700-1900kcal/day   Protein:  84-94g/day   Fluid:  >1.4L/day   CaseyKoleen DistanceRD, LDN Pager #- [phone]#- [phone] Hours Pager: [phone]

## 2018-10-31 NOTE — Progress Notes (Signed)
Per MD okay for RN to place diet order. NPO with sips with meds,.

## 2018-10-31 NOTE — Consult Note (Signed)
[facility] Nurse ostomy consult note  Stoma type: Ileostomy performed 11/19 according to EMR. Stomal assessment/size: Assessed through intact pouch, small amount of tan drainage over stoma. Patient and daughter report minimal output. Peristomal assessment: Not seen today Treatment options for stomal/peristomal skin: N/A Output: As described today Ostomy pouching: 2pc. 2 and 1/4 inches in place and provided to bedside. Education provided: None required. Enrolled patient in [place] program: Yes, previous admission.  [place] Nurse ostomy consult note Midline stomatized EC fistula Stoma type/location:  Assessment/size: Visualized through intact pouching system applied by Dr. Dahlia Byes in the ED on 10/30/2018 Peristomal assessment: Not seen today Treatment options for stomal/peristomal skin: Dr. Dahlia Byes used Dermabond, Mastisol and ostomy paste to apply Eakin pouching system according to his note. These are non formulary items. Output: thin brown/green effluent in bottom of pouch, tubing and collecting in bedside drainage bag. Ostomy pouching: 1pc.Small Eakin pouch  Education provided: None required. Pouches ordered to room. Patient and daughter will inquire of Dr. Dahlia Byes how to obtain Dermabond, Mastisol and paste. Enrolled patient in [place] program: Yes, previously  [place] nursing team will follow, and will remain available to this patient, the nursing and medical teams.   Thanks, Maudie Flakes, MSN, RN, [place], Arther Abbott  Pager# [phone]

## 2018-10-31 NOTE — Care Management (Signed)
Since previous discharge patient has changed from [facility] to [facility] home health.  RNCM confirmed with Cassie at [facility] that patient is open with RN, PT, OT, speech.   [facility] is still providing home TPN.  Per Corene Cornea with [facility] will need new TPN orders at discharge.

## 2018-11-01 LAB — MAGNESIUM: Magnesium: 2 mg/dL (ref 1.7–2.4)

## 2018-11-01 LAB — GLUCOSE, CAPILLARY
Glucose-Capillary: 102 mg/dL — ABNORMAL HIGH (ref 70–99)
Glucose-Capillary: 109 mg/dL — ABNORMAL HIGH (ref 70–99)
Glucose-Capillary: 114 mg/dL — ABNORMAL HIGH (ref 70–99)

## 2018-11-01 LAB — BASIC METABOLIC PANEL
Anion gap: 5 (ref 5–15)
BUN: 26 mg/dL — ABNORMAL HIGH (ref 8–23)
CO2: 28 mmol/L (ref 22–32)
Calcium: 8.3 mg/dL — ABNORMAL LOW (ref 8.9–10.3)
Chloride: 106 mmol/L (ref 98–111)
Creatinine, Ser: 0.57 mg/dL (ref 0.44–1.00)
GFR calc non Af Amer: >60 mL/min (ref >60)
Glucose, Bld: 98 mg/dL (ref 70–99)
Potassium: 3.8 mmol/L (ref 3.5–5.1)
SODIUM: 139 mmol/L (ref 135–145)

## 2018-11-01 LAB — PHOSPHORUS: Phosphorus: 4.3 mg/dL (ref 2.5–4.6)

## 2018-11-01 LAB — ALBUMIN: Albumin: 2.5 g/dL — ABNORMAL LOW (ref 3.5–5.0)

## 2018-11-01 MED ORDER — LORAZEPAM 2 MG/ML IJ SOLN: 1.0000 mg | Freq: Four times a day (QID) | INTRAMUSCULAR | 0 refills | Status: AC | PRN

## 2018-11-01 MED ORDER — MORPHINE SULFATE (PF) 2 MG/ML IV SOLN: 2.0000 mg | INTRAVENOUS | 0 refills | Status: AC | PRN

## 2018-11-01 MED ORDER — INFLUENZA VAC SPLIT HIGH-DOSE 0.5 ML IM SUSY: 0.5000 mL | PREFILLED_SYRINGE | INTRAMUSCULAR | 0 refills | Status: AC

## 2018-11-01 MED ORDER — TRAMADOL HCL 50 MG PO TABS: 100.0000 mg | ORAL_TABLET | Freq: Four times a day (QID) | ORAL | Status: AC | PRN

## 2018-11-01 MED ORDER — ENOXAPARIN SODIUM 40 MG/0.4ML ~~LOC~~ SOLN: 40.0000 mg | SUBCUTANEOUS | Status: AC

## 2018-11-01 MED ORDER — FAT EMULSION PLANT BASED 20 % IV EMUL: 250.0000 mL | INTRAVENOUS | Status: AC

## 2018-11-01 MED ORDER — PANTOPRAZOLE SODIUM 40 MG IV SOLR: 40.0000 mg | Freq: Two times a day (BID) | INTRAVENOUS | Status: AC

## 2018-11-01 MED ORDER — FAT EMULSION PLANT BASED 20 % IV EMUL
250.0000 mL | INTRAVENOUS | Status: DC
  Filled 2018-11-01: qty 250

## 2018-11-01 MED ORDER — TRACE MINERALS CR-CU-MN-SE-ZN 10-1000-500-60 MCG/ML IV SOLN
INTRAVENOUS | Status: DC
  Filled 2018-11-01: qty 1992

## 2018-11-01 MED ORDER — INSULIN ASPART 100 UNIT/ML ~~LOC~~ SOLN: 0.0000 [IU] | Freq: Four times a day (QID) | SUBCUTANEOUS | 11 refills | Status: AC

## 2018-11-01 NOTE — Consult Note (Addendum)
[place] Nurse ostomy follow up Stoma type/location: Ileostomy RUQ (inactive), EC Fistula in mideline Daughter and patient report that surgeon visited earlier this morning and attached the EC Fistula system (Eakin) to wall suction. He is to return later today. They mention that the surgeon is contemplating referral to [facility] in [place], [place] where there is a specialist for [facility] fistula repair. Support provided. Patient reports that she rested until 3am and then experienced "gripping" abdominal pain. She was medicated and drifted back to sleep. Education provided: None required Enrolled patient in [employer] Discharge program: Yes, previous admission  [facility] nursing team will follow along, and will remain available to this patient, the nursing, surgical and medical teams.   Thanks, Maudie Flakes, MSN, RN, [place], Arther Abbott  Pager# [phone]

## 2018-11-01 NOTE — Progress Notes (Signed)
PHARMACY - ADULT TOTAL PARENTERAL NUTRITION CONSULT NOTE   Pharmacy Consult for TPN Indication: Malnutrition. Pt on home TPN  Patient Measurements: Height: 5\' 3"  (160 cm) Weight: 175 lb (79.4 kg) IBW/kg (Calculated) : 52.4 TPN AdjBW (KG): 58.9 Body mass index is 31 kg/m.  Assessment:  73 y/o female with h/o transverse colectomy for unresectable colon polyp on 08/23/18 recent admission for incisional abscess and wound dehiscence requiring exploratory laparotomy incisional abscess washout, primary closure of anastomosis leak, placement of phasix mesh 11/17; pt now admitted for abdominal pain, stool in JP drain and wound dehiscence.  8 percent weight loss in 2 months, severe fat depletion in orbital region, moderate muscle depletions in BLE. Will continue the same regimen from last admission.    Endo: BG wnl  Insulin requirements in the past 24 hours: none Lytes: all labs WNL.  Renal: at baseline Scr  Pulm: on room air Cards: Negative for chest pain Hepatobil: Neuro:Negative for headaches, weakness or numbness.  TPN Access: PICC TPN start date: 10/30/18 Nutritional Goals (per RD recommendation on 10/30/18): KCal: 1700-1900 kcal/day  Protein: 84-94 g/day Fluid:> 1.4L/day   Goal TPN rate is 83 ml/hr (provides 90% of her requirement)  Current Nutrition: none noted   Plan:  The patient is in the process of being transferred to [facility]  Continue Clinimix E 5/15 TPN at goal rate of 83 mL/hr + 20% lipids at 63mL/hr Regimen @ goal rate provides 1894kcal/day, 100g/day protein, 2231ml volume  Additional Electrolytes in TPN: none Add MVI, trace elements, vitC 500 mg Continue q6h CBGS and sensitive SSI and adjust as needed F/U electrolytes 11/02/2018   Dallie Piles, PharmD Clinical Pharmacist 11/01/2018,1:26 PM

## 2018-11-01 NOTE — Discharge Summary (Addendum)
Physician Discharge Summary  Patient ID: Cassie Carlson MRN: [id number] DOB/AGE: 03/23/1946 73 y.o.  Admit date: 10/30/2018 Discharge date: 11/01/2018  Admission Diagnoses: ec fistula  Discharge Diagnoses:  Same as above  Discharged Condition: good  Hospital Course: Pt with complicated history as noted below. 11/4/19lap converted to open transverse colectomy for unresectable colon polyp.  11/16/19exlap, wound washout, Phasix mesh placement over dehiscence fromabove.   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    In-house patient was placed on strict n.p.o. on TPN through a PICC line.  multiple combinations of IV and p.o. drugs were trialed and attempt to decrease the overall fistulous output while maintaining wound care to the area with an Eakin pouch.  Throughout her hospital stay the wound continued to decrease in size and the Eakin pouch maintained a good seal to minimize any sort of leakage.  However, there was no consistent medical regimen that was trialed to be beneficial in decreasing the overall fistulous output.  In the end the fistulous output continued steady to be around between 200 to 900 mL's/24hrs.  She will have a couple days of 200 mL's of output and then 1 day of increased output to around the 900s.  Which was subsequently dropped back down to the 200s in a day without any additional interventions or changes in her medical regimen.  Ideally we would have her continue to attempt a different medications but we exhausted our options.  During her hospitalization the patient did have episodic times of hallucinations and confusion which was noted on her previous hospital admissions that resolved once she was  discharged.  Due to the stalling of gaining output control, increasing confusion and hallucinations, we deemed it to be the best option for the patient to continue to receive care at home.  Prior experiences at nursing homes and Medicare criteria not allowing patient to be transferred to [facility] prohibited these options.  Prior to discharge we did ensure that the Eakin pouch remained leak free for an extended period of time, and that home health will be able to provide wound care as needed along with TPN management.  Pt returned to hospital from above discharge and readmitted for overflowing bag and reported increased output.  Transfer arranged to [facility] once new bag was placed inhouse and placed on wall suction to limit amount of leakage.  She remained on TPN while at home without any issues, and continued it during her brief stay here.   Consults: WOCN, RD  Discharge Exam: Blood pressure 111/62, pulse 76, temperature 97.6 F (36.4 C), resp. rate 16, height 5\' 3"  (1.6 m), weight 79.4 kg, SpO2 95 %. Constitutional :  alert, cooperative, appears stated age and no distress  Gastrointestinal: soft, non-tender; bowel sounds normal; no masses, no organomegaly. Midline wound covered in Eakin pouch and ileostomy pouch intact. Midline wound has thick granulation tissue wound around the edges that is slowly closing in space. The open space measures approx 4cm x 3 cm x64mm Output is yellow tinged, on intermittent wall suction. The former blake drain site closed now. Ileostomy bag intact  Skin: Cool and moist.   Psychiatric: Normal affect, non-agitated, not confused     Disposition:  [facility] hospital  Med Rec as noted below:  Start  On Transfer Reviewing Provider Ordered  10/31/18 1800  .TPN (CLINIMIX-E) Adult Start: 10/31/18 1800, End: 11/01/18 1759, Intravenous, 83 mL/hr, Continuous    Continue Benjamine Sprague, DO 10/31/18 1111  11/01/18 1800  .TPN (CLINIMIX-E) Adult Start: 11/01/18 1800, End:  11/02/18 1759, Intravenous, 83 mL/hr, Continuous    Continue Rodriguez Aguinaldo, DO 11/01/18 1330  10/30/18 1800  acetaminophen (TYLENOL) tablet 1,000 mg Start: 10/30/18 1800, 1,000 mg, Oral, Every 6 hours    Continue [place], Zarriah Starkel, DO 10/30/18 1634  10/31/18 0500  Albumin Start: 10/31/18 0500, End: 11/02/18 0500, Daily, STAT    Continue Thoren Hosang, DO 10/30/18 1424  10/31/18 1000  ARIPiprazole (ABILIFY) tablet 2 mg Start: 10/31/18 1000, 2 mg, Oral, Daily    Continue [place], [place], DO 10/30/18 1634  10/31/18 1610  Basic metabolic panel Start: 96/04/54 0500, End: 11/02/18 0500, Daily, STAT    Continue Nacole Fluhr, DO 10/30/18 1543  11/06/18 0500  CBC Start: 11/06/18 0500, End: 11/13/18 0500, Weekly, STAT    Continue Orlandria Kissner, DO 10/30/18 1424  11/06/18 0500  Creatinine, serum Start: 11/06/18 0500, Weekly, TIMED  Comments: while on enoxaparin therapy    Continue Lysle Pearl, Shadia Larose, DO 10/30/18 1944  10/31/18 0500  Daily weights Start: 10/31/18 0500, Daily, R    Continue Benjamine Sprague, DO 10/30/18 1507  10/31/18 1621  Diet NPO time specified Except for: Sips with Meds Start: 10/31/18 1621, Diet effective now, R    Continue Chiquetta Langner, DO 10/31/18 1620  10/30/18 2100  enoxaparin (LOVENOX) injection 40 mg Start: 10/30/18 2100, 40 mg, Subcutaneous, Every 24 hours    Continue Benjamine Sprague, DO 10/30/18 1944  11/01/18 1800  Fat emulsion 20 % infusion 250 mL Start: 11/01/18 1800, End: 11/02/18 0559, 250 mL, Intravenous, 20 mL/hr, Continuous    Continue Celia Friedland, DO 11/01/18 1330  10/31/18 1200  Fistula checks Start: 10/31/18 1200, End: 11/10/18 1000, Every 2 hours, R  Comments: Check Eakin fistula pouch and ...   Continue Lysle Pearl, [place], DO 10/31/18 1153  11/06/18 0500  Hepatic function panel Start: 11/06/18 0500, Weekly, STAT    Continue [place], Rino Hosea, DO 10/30/18 1424  10/31/18 1000  Influenza vac split quadrivalent PF (FLUZONE HIGH-DOSE) injection 0.5 mL Start: 10/31/18 1000, 0.5 mL,  Intramuscular, Tomorrow-1000    Continue Terrez Ander, DO 10/30/18 2025  10/30/18 1131  Insert peripheral IV Start: 10/30/18 1131, End: 10/30/18 1131, Once, STAT    Continue [place], Blong Busk, DO 10/30/18 1130  10/30/18 1600  insulin aspart (novoLOG) injection 0-9 Units Start: 10/30/18 1600, 0-9 Units, Subcutaneous, Every 6 hours    Continue Benjamine Sprague, DO 10/30/18 1547  10/30/18 1645  lactated ringers infusion Start: 10/30/18 1645, Intravenous, 75 mL/hr, Continuous    Continue Benjamine Sprague, DO 10/30/18 1634  10/31/18 0600  levothyroxine (SYNTHROID, LEVOTHROID) tablet 25 mcg Start: 10/31/18 0600, 25 mcg, Oral, Daily before breakfast    Continue Clairissa Valvano, DO 10/30/18 1634  10/30/18 1800  loperamide (IMODIUM) capsule 4 mg Start: 10/30/18 1800, 4 mg, Oral, 4 times daily    Continue [place], Roslynn Holte, DO 10/30/18 1634  10/30/18 1634  LORazepam (ATIVAN) injection 1 mg Start: 10/30/18 1634, 1 mg, Intravenous, Every 6 hours PRN, PRN Reason(s): anxiety    Continue Lysle Pearl, Aadvika Konen, DO 10/30/18 1634  10/31/18 0500  Magnesium Start: 10/31/18 0500, End: 11/02/18 0500, Daily, STAT    Continue Benjamine Sprague, DO 10/30/18 1424  10/30/18 1634  morphine 2 MG/ML injection 2 mg Start: 10/30/18 1634, 2 mg, Intravenous, Every 3 hours PRN, PRN  Reason(s): severe pain    Continue Benjamine Sprague, DO 10/30/18 1634  10/30/18 1547  No basal insulin at this time Start: 10/30/18 1547, Until discontinued, R    Continue [place], [place], DO 10/30/18 1547  10/30/18 1634  ondansetron (ZOFRAN-ODT) disintegrating tablet 4 mg Start: 10/30/18 1634, 4 mg, Oral, Every 6 hours PRN, PRN Reason(s): nausea    Andres Labrum, DO 10/30/18 1634  10/31/18 1147  Ostomy Care Start: 10/31/18 1147, Until discontinued, R  Comments: Care for ostomy and EC Fistula...   Continue Lysle Pearl, [place], DO 10/31/18 1153  10/30/18 2200  pantoprazole (PROTONIX) injection 40 mg Start: 10/30/18 2200, 40 mg, Intravenous, Every 12 hours    Continue Benjamine Sprague, DO 10/30/18 1944  10/30/18 1405  pharmacy consult Start: 10/30/18 1405, Until discontinued, STAT  Provider: (Not yet assigned)   Continue Chryl Holten, [place], DO 10/30/18 1405  10/31/18 0500  Phosphorus Start: 10/31/18 0500, End: 11/02/18 0500, Daily, STAT    Continue Benjamine Sprague, DO 10/30/18 1424  10/30/18 1546  Refer to Hypoglycemia Protocol Sidebar Report for treatment of CBG < 70 mg/dl Start: 10/30/18 1546, Until discontinued, R    Continue [place], Reymond Maynez, DO 10/30/18 1547  10/31/18 1154  Sacral Foam Prophylactic Dressing Start: 10/31/18 1154, Every third day, R    Continue [place], [place], DO 10/31/18 1153  10/30/18 1944  SCDs Start: 10/30/18 1944, Until discontinued, R    Continue Charene Mccallister, DO 10/30/18 1944  10/30/18 1546  STAT CBG when hypoglycemia is suspected. If treated, recheck every 15 minutes after each treatment until CBG >/= 70 mg/dl Start: 10/30/18 1546, Until discontinued, STAT    Continue Benjamine Sprague, DO 10/30/18 1547  10/30/18 1635  Strict intake and output Start: 10/30/18 1635, Until discontinued, R    Continue [place], Nakiyah Beverley, DO 10/30/18 1634  10/30/18 1634  traMADol (ULTRAM) tablet 100 mg Start: 10/30/18 1634, 100 mg, Oral, Every 6 hours PRN, PRN Reason(s): moderate pain    Continue [place], Valton Schwartz, DO 10/30/18 1634  11/06/18 0500  Triglycerides Start: 11/06/18 0500, Weekly, STAT    Continue [place], Caspar Favila, DO 10/30/18 1424  10/30/18 1700  venlafaxine (EFFEXOR) tablet 75 mg Start: 10/30/18 1700, 75 mg, Oral, 2 times daily with meals    Continue Benjamine Sprague, DO 10/30/18 1634        Total time spent arranging discharge was >32min. Signed: Benjamine Sprague 11/01/2018, 2:35 PM

## 2018-11-01 NOTE — Care Management (Signed)
RNCM to notify Cassie with [facility] and Corene Cornea with [facility] of transfer to [facility]

## 2018-11-01 NOTE — Progress Notes (Signed)
Discharge order received. Patient is alert and oriented. Vital signs stable . No signs of acute distress. Discharge instructions given. Patient verbalized understanding. No other issues noted at this time.   

## 2018-11-01 NOTE — Progress Notes (Signed)
Dr. Lysle Pearl is aware that he needs to call the accepting Physician at [facility] to move on with  the transfer. The office has submitted needed pt. Information. Pending "physician to physician" report status.

## 2019-04-01 MED ORDER — CVS SORE THROAT SPRAY MT
OROMUCOSAL | Status: DC
Start: ? — End: 2019-04-01

## 2019-04-01 MED ORDER — QUINERVA 260 MG PO TABS
650.00 | ORAL_TABLET | ORAL | Status: DC
Start: 2019-04-05 — End: 2019-04-01

## 2019-04-01 MED ORDER — ESTROPLUS PO TABS
1.00 | ORAL_TABLET | ORAL | Status: DC
Start: ? — End: 2019-04-01

## 2019-04-01 MED ORDER — CVS EAR DROPS OT
40.00 | OTIC | Status: DC
Start: 2019-04-02 — End: 2019-04-01

## 2019-04-01 MED ORDER — METHYLPHENIDATE HCL POWD
50.00 | Status: DC
Start: ? — End: 2019-04-01

## 2019-04-05 MED ORDER — IPECAC PO
5.00 | ORAL | Status: DC
Start: ? — End: 2019-04-05

## 2019-04-05 MED ORDER — STRI-DEX MAXIMUM STRENGTH 2 % EX PADS
125.00 | MEDICATED_PAD | CUTANEOUS | Status: DC
Start: ? — End: 2019-04-05

## 2019-04-05 MED ORDER — IPECAC PO
40.00 | ORAL | Status: DC
Start: ? — End: 2019-04-05

## 2019-04-05 MED ORDER — LOSARTAN POTASSIUM (ANGIOTENSIN II RECEPTOR ANTAGONISTS)
20.00 | Status: DC
Start: 2019-04-05 — End: 2019-04-05

## 2019-04-05 MED ORDER — BENEFIBER PO
75.00 | ORAL | Status: DC
Start: 2019-04-05 — End: 2019-04-05

## 2019-04-05 MED ORDER — INSULIN LISPRO 100 UNIT/ML ~~LOC~~ SOLN
2.00 | SUBCUTANEOUS | Status: DC
Start: 2019-04-05 — End: 2019-04-05

## 2019-04-05 MED ORDER — INTROL PO
2.00 | ORAL | Status: DC
Start: 2019-04-05 — End: 2019-04-05

## 2019-04-05 MED ORDER — GENERIC EXTERNAL MEDICATION
Status: DC
Start: ? — End: 2019-04-05

## 2019-04-05 MED ORDER — ONDANSETRON HCL 4 MG/2ML IJ SOLN
4.00 | INTRAMUSCULAR | Status: DC
Start: ? — End: 2019-04-05

## 2019-04-05 MED ORDER — FOSPHENYTOIN SODIUM 50 MG PE/ML IJ SOLN
15.00 | INTRAMUSCULAR | Status: DC
Start: ? — End: 2019-04-05

## 2019-04-05 MED ORDER — CHLORPHENIRAMINE-PHENYLEPHRINE 8-20 MG PO TBCR
25.00 | EXTENDED_RELEASE_TABLET | ORAL | Status: DC
Start: 2019-04-06 — End: 2019-04-05

## 2019-04-05 MED ORDER — BARO-CAT PO
250.00 | ORAL | Status: DC
Start: ? — End: 2019-04-05

## 2019-04-05 MED ORDER — IPECAC PO
80.00 | ORAL | Status: DC
Start: ? — End: 2019-04-05

## 2019-04-05 MED ORDER — Medication
Status: DC
Start: ? — End: 2019-04-05

## 2019-04-05 MED ORDER — INFUSION CATHETER MISC
Status: DC
Start: ? — End: 2019-04-05

## 2019-04-05 MED ORDER — GENERIC EXTERNAL MEDICATION
Status: DC
Start: 2019-04-05 — End: 2019-04-05

## 2019-04-19 DEATH — deceased
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
## 2020-06-23 IMAGING — DX DG ABDOMEN 1V
1 series · 1 of 1 positions shown · non-contrast
Comparison: KUB of 08/25/2017

CLINICAL DATA: NG tube placement

EXAM:
ABDOMEN - 1 VIEW

[abdomen supine]
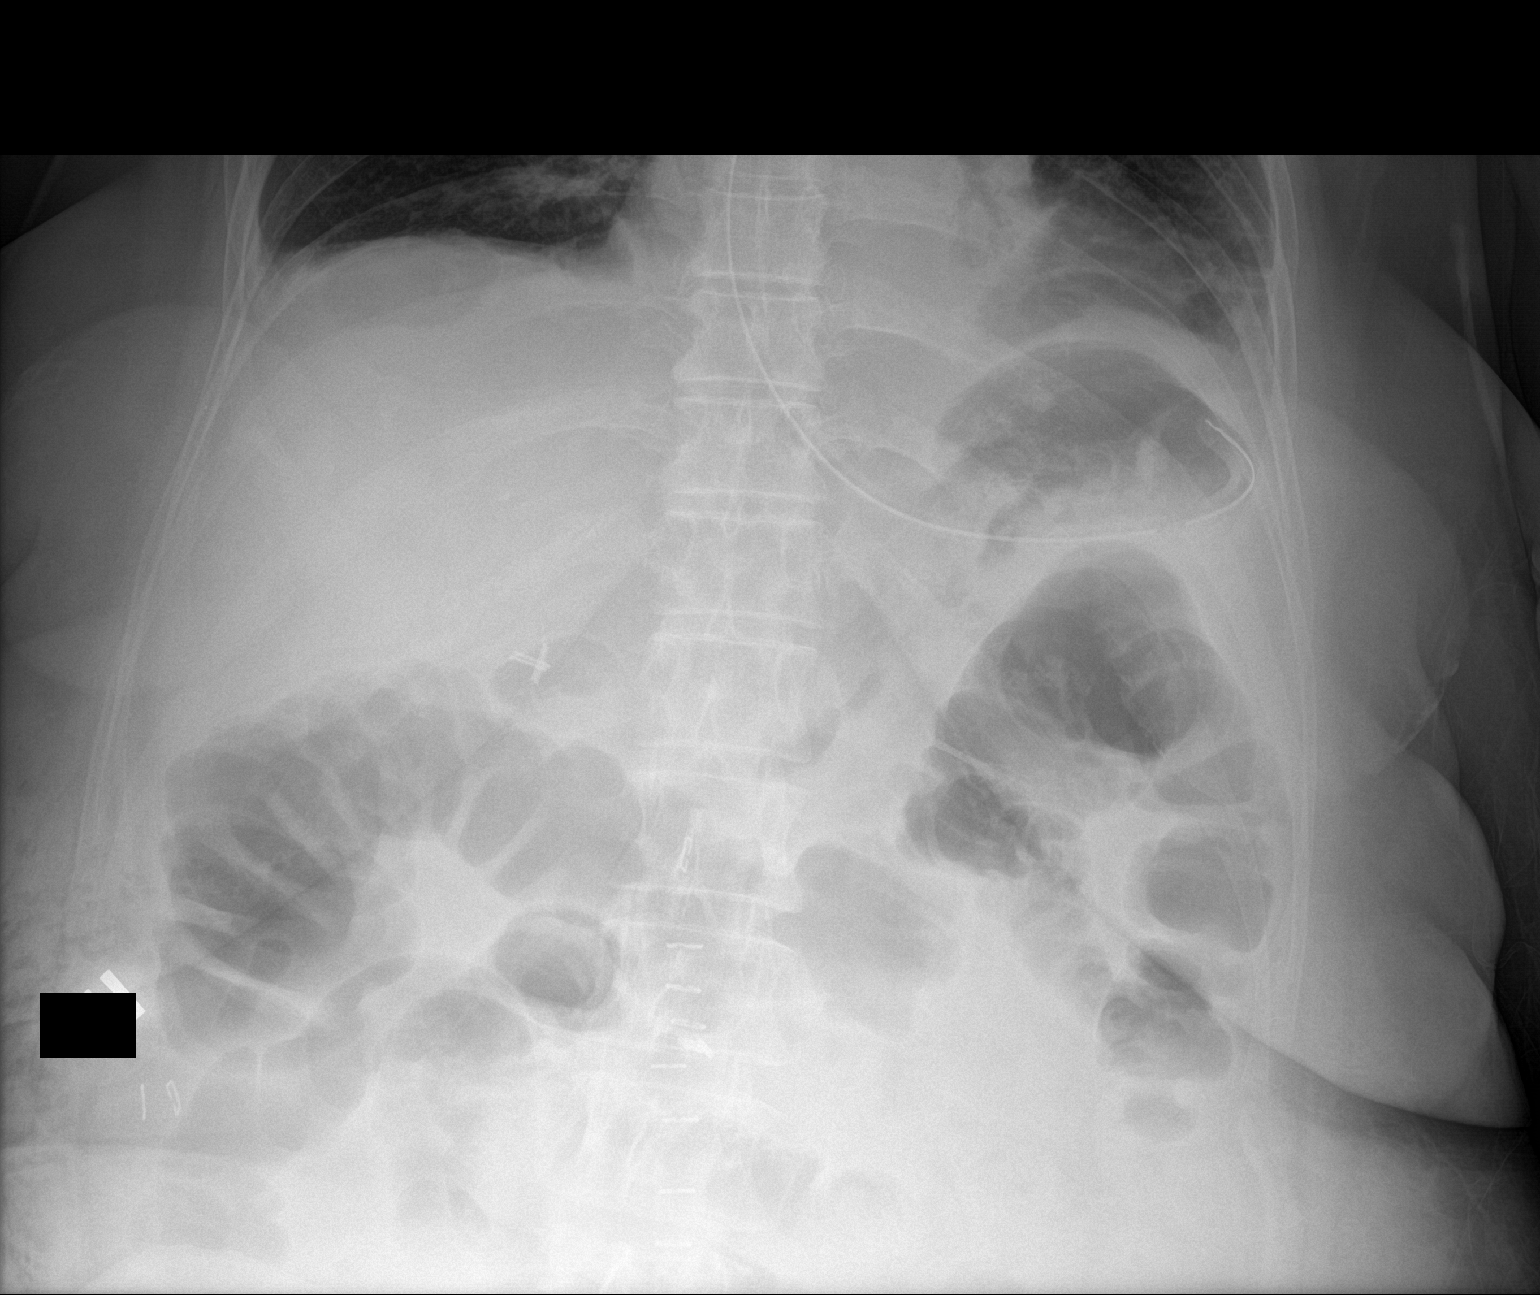

[1 of 1 positions shown; findings below may reference images not displayed]

FINDINGS: And NG tube is been placed and the tip is coiling in the fundus of
the stomach. Both large and small bowel gas remains. There is some
opacity medially at the left lung base which may represent
atelectasis or pneumonia.
IMPRESSION: NG tube tip coils in the fundus of the stomach. Atelectasis or
pneumonia at the medial left lung base.
